# Patient Record
Sex: Female | Born: 1935 | Race: White | Hispanic: No | Smoking: Former smoker
Health system: Southern US, Community
[De-identification: ages and names within clinical notes are randomized; demographics above are authoritative.]

## PROBLEM LIST (undated history)

## (undated) DIAGNOSIS — Z860100 Personal history of colon polyps, unspecified: Secondary | ICD-10-CM

## (undated) DIAGNOSIS — Z9889 Other specified postprocedural states: Secondary | ICD-10-CM

## (undated) DIAGNOSIS — Z9221 Personal history of antineoplastic chemotherapy: Secondary | ICD-10-CM

## (undated) DIAGNOSIS — C50919 Malignant neoplasm of unspecified site of unspecified female breast: Secondary | ICD-10-CM

## (undated) DIAGNOSIS — E119 Type 2 diabetes mellitus without complications: Secondary | ICD-10-CM

## (undated) DIAGNOSIS — K5790 Diverticulosis of intestine, part unspecified, without perforation or abscess without bleeding: Secondary | ICD-10-CM

## (undated) DIAGNOSIS — J45909 Unspecified asthma, uncomplicated: Secondary | ICD-10-CM

## (undated) DIAGNOSIS — Z973 Presence of spectacles and contact lenses: Secondary | ICD-10-CM

## (undated) DIAGNOSIS — T7840XA Allergy, unspecified, initial encounter: Secondary | ICD-10-CM

## (undated) DIAGNOSIS — H9190 Unspecified hearing loss, unspecified ear: Secondary | ICD-10-CM

## (undated) DIAGNOSIS — I10 Essential (primary) hypertension: Secondary | ICD-10-CM

## (undated) DIAGNOSIS — M549 Dorsalgia, unspecified: Secondary | ICD-10-CM

## (undated) DIAGNOSIS — H269 Unspecified cataract: Secondary | ICD-10-CM

## (undated) DIAGNOSIS — R112 Nausea with vomiting, unspecified: Secondary | ICD-10-CM

## (undated) DIAGNOSIS — E039 Hypothyroidism, unspecified: Secondary | ICD-10-CM

## (undated) DIAGNOSIS — K649 Unspecified hemorrhoids: Secondary | ICD-10-CM

## (undated) DIAGNOSIS — E785 Hyperlipidemia, unspecified: Secondary | ICD-10-CM

## (undated) HISTORY — PX: BREAST SURGERY: SHX581

## (undated) HISTORY — PX: CARDIAC CATHETERIZATION: SHX172

## (undated) HISTORY — PX: COLONOSCOPY: SHX174

## (undated) HISTORY — DX: Personal history of antineoplastic chemotherapy: Z92.21

## (undated) HISTORY — PX: NEUROPLASTY / TRANSPOSITION MEDIAN NERVE AT CARPAL TUNNEL BILATERAL: SUR894

## (undated) HISTORY — PX: APPENDECTOMY: SHX54

## (undated) HISTORY — PX: NASAL FRACTURE SURGERY: SHX718

## (undated) HISTORY — PX: OTHER SURGICAL HISTORY: SHX169

## (undated) HISTORY — PX: ABDOMINAL HYSTERECTOMY: SHX81

## (undated) HISTORY — PX: TONSILLECTOMY: SUR1361

---
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
## 2011-12-28 ENCOUNTER — Other Ambulatory Visit: Payer: Self-pay | Admitting: Family Medicine

## 2011-12-28 DIAGNOSIS — N6310 Unspecified lump in the right breast, unspecified quadrant: Secondary | ICD-10-CM

## 2012-01-05 ENCOUNTER — Ambulatory Visit
Admission: RE | Admit: 2012-01-05 | Discharge: 2012-01-05 | Disposition: A | Discharge status: Home / Self Care | Payer: Medicare Other | Source: Ambulatory Visit | Attending: Family Medicine | Admitting: Family Medicine

## 2012-01-05 DIAGNOSIS — N6310 Unspecified lump in the right breast, unspecified quadrant: Secondary | ICD-10-CM

## 2012-01-05 DIAGNOSIS — C50411 Malignant neoplasm of upper-outer quadrant of right female breast: Secondary | ICD-10-CM | POA: Insufficient documentation

## 2012-01-12 ENCOUNTER — Encounter (INDEPENDENT_AMBULATORY_CARE_PROVIDER_SITE_OTHER): Payer: Self-pay | Admitting: Surgery

## 2012-01-12 ENCOUNTER — Ambulatory Visit (INDEPENDENT_AMBULATORY_CARE_PROVIDER_SITE_OTHER): Payer: Medicare Other | Admitting: Surgery

## 2012-01-12 VITALS — BP 183/104 | HR 74 | Temp 97.2°F | Resp 18 | Ht 65.5 in | Wt 189.6 lb

## 2012-01-12 DIAGNOSIS — C50919 Malignant neoplasm of unspecified site of unspecified female breast: Secondary | ICD-10-CM

## 2012-01-12 DIAGNOSIS — C50911 Malignant neoplasm of unspecified site of right female breast: Secondary | ICD-10-CM

## 2012-01-12 NOTE — Patient Instructions (Addendum)
We will arrange surgery to remove the cancer from your right breast. This will be a lumpectomy. We will also check the lymph nodes in the right armpit.if you have any questions give [place] a call. Should like to get a second opinion let me know and we will try to arrange that wherever you would like to be seen.

## 2012-01-12 NOTE — Progress Notes (Signed)
Patient ID: Heather Vega, female   DOB: 02/21/1936, 76 y.o.   MRN: [id number]  Chief Complaint  Patient presents with  . Breast Cancer    Right    HPI Heather Vega is a 76 y.o. female.  She recently had a mammogram. Her last mammogram was about 5 years earlier. This new mammogram showed a suspicious spot in the upper outer quadrant of the right breast. It also looks suspicious on ultrasound. Core biopsy has shown invasive ductal carcinoma. She comes in today for surgical consultation. She's had no symptoms from her breast. She had a benign mass removed from her breast many many years ago. She has no family history of breast cancer. HPI  History reviewed. No pertinent past medical history.  Past Surgical History  Procedure Date  . Nose surgery 1953  . Tonsillectomy 1955  . Abdominal hysterectomy   . Breast surgery     History reviewed. No pertinent family history.  Social History History  Substance Use Topics  . Smoking status: Former Smoker  . Smokeless tobacco: Not on file  . Alcohol Use: No    No Known Allergies  Current Outpatient Prescriptions  Medication Sig Dispense Refill  . aspirin 81 MG tablet Take 81 mg by mouth daily.      . CINNAMON PO Take by mouth.      . metFORMIN (GLUCOPHAGE) 1000 MG tablet Take 1,000 mg by mouth 2 (two) times daily with a meal.      . Multiple Vitamins-Minerals (MULTIVITAMIN WITH MINERALS) tablet Take 1 tablet by mouth daily.      . Vitamins C E (CRANBERRY CONCENTRATE PO) Take by mouth.        Review of Systems Review of Systems  Constitutional: Negative for fever, chills and unexpected weight change.  HENT: Negative for hearing loss, congestion, sore throat, trouble swallowing and voice change.   Eyes: Negative for visual disturbance.  Respiratory: Negative for cough and wheezing.   Cardiovascular: Negative for chest pain, palpitations and leg swelling.  Gastrointestinal: Negative for nausea, vomiting, abdominal pain,  diarrhea, constipation, blood in stool, abdominal distention and anal bleeding.  Genitourinary: Negative for hematuria, vaginal bleeding and difficulty urinating.  Musculoskeletal: Negative for arthralgias.  Skin: Negative for rash and wound.  Neurological: Negative for seizures, syncope and headaches.  Hematological: Negative for adenopathy. Does not bruise/bleed easily.  Psychiatric/Behavioral: Negative for confusion.    Blood pressure 183/104, pulse 74, temperature 97.2 F (36.2 C), temperature source Temporal, resp. rate 18, height 5' 5.5" (1.664 m), weight 189 lb 9.6 oz (86.002 kg).  Physical Exam Physical Exam  Vitals reviewed. Constitutional: She is oriented to person, place, and time. She appears well-developed and well-nourished. No distress.  HENT:  Head: Normocephalic and atraumatic.  Mouth/Throat: Oropharynx is clear and moist.  Eyes: Conjunctivae normal and EOM are normal. Pupils are equal, round, and reactive to light. No scleral icterus.  Neck: Normal range of motion. Neck supple. No tracheal deviation present. No thyromegaly present.  Cardiovascular: Normal rate, regular rhythm, normal heart sounds and intact distal pulses.  Exam reveals no gallop and no friction rub.   No murmur heard. Pulmonary/Chest: Effort normal and breath sounds normal. No respiratory distress. She has no wheezes. She has no rales.         Small ecchymosis in the right breast upper-outer quadrant is documented. No palpable mass. No palpable mass in the other breast or other abnormality in either breast.  Abdominal: Soft. Bowel sounds are normal. She exhibits no   distension and no mass. There is no tenderness. There is no rebound and no guarding.  Musculoskeletal: Normal range of motion. She exhibits no edema and no tenderness.  Lymphadenopathy:    She has no cervical adenopathy.    She has no axillary adenopathy.       Right: No supraclavicular adenopathy present.       Left: No supraclavicular  adenopathy present.  Neurological: She is alert and oriented to person, place, and time.  Skin: Skin is warm and dry. No rash noted. She is not diaphoretic. No erythema.  Psychiatric: She has a normal mood and affect. Her behavior is normal. Judgment and thought content normal.    Data Reviewed I have reviewed the mammogram and ultrasound reports as well as the pathology report  Assessment    Clinical stage I invasive ductal carcinoma right breast upper outer quadrant    Plan    I have explained the pathophysiology and staging of breast cancer with particular attention to her exact situation. We discussed the multidisciplinary approach to breast cancer which often includes both medical and radiation oncology consultations.  We also discussed surgical options for the treatment of breast cancer including lumpectomy and mastectomy with possible reconstructive surgery. In addition we talked about the evaluation and management of lymph nodes including a description of sentinel lymph node biopsy and axillary dissections. We reviewed potential complications and risks including bleeding, infection, numbness,  lymphedema, and the potential need for additional surgery.  She understands that for patients who are candidate for lumpectomy or mastectomy there is an equal survival rate with either technique, but a slightly higher local recurrence rate with lumpectomy. In addition she knows that a lumpectomy usually requires postoperative radiation as part of the management of the breast cancer.  We have discussed the likely postoperative course and plans for followup.  I have given the patient some written information that reviewed all of these issues. I believe her questions are answered and that she has a good understanding of the issues. I spent 30 minutes in direct counseling with the patient, her friend, and her son who took extensive notes. My recommendation was for wire localized lumpectomy and  sentinel node evaluation. Since she is strongly estrogen receptor positive I think she will likely need anti-estrogen therapy postoperatively. She may need radiation therapy. Although she is 76, she is very active and relatively healthy and I thought it would be appropriate to do a sentinel lymph node evaluation at the time of her lumpectomy. We discussed risks and complications involved that surgery. We talked about alternatives including a mastectomy, and the possibility of just using antiestrogen therapy alone with no surgical intervention. I think all questions were answered as noted above. Currently she would like to schedule surgery but this could also consider whether or not second opinion. I told her we would be happy to arrange a second opinion with one of the medical oncologists here in town the surgeon at one of the local Medical Center's.       Arin Peral J 01/12/2012, 4:01 PM    

## 2012-01-31 ENCOUNTER — Encounter (HOSPITAL_COMMUNITY): Payer: Self-pay

## 2012-01-31 ENCOUNTER — Encounter (HOSPITAL_COMMUNITY)
Admission: RE | Admit: 2012-01-31 | Discharge: 2012-01-31 | Disposition: A | Discharge status: Home / Self Care | Payer: Medicare Other | Source: Ambulatory Visit | Attending: Surgery | Admitting: Surgery

## 2012-01-31 HISTORY — DX: Unspecified asthma, uncomplicated: J45.909

## 2012-01-31 HISTORY — DX: Personal history of colon polyps, unspecified: Z86.0100

## 2012-01-31 HISTORY — DX: Personal history of colonic polyps: Z86.010

## 2012-01-31 HISTORY — DX: Type 2 diabetes mellitus without complications: E11.9

## 2012-01-31 HISTORY — DX: Diverticulosis of intestine, part unspecified, without perforation or abscess without bleeding: K57.90

## 2012-01-31 HISTORY — DX: Unspecified hearing loss, unspecified ear: H91.90

## 2012-01-31 HISTORY — DX: Hyperlipidemia, unspecified: E78.5

## 2012-01-31 HISTORY — DX: Hypothyroidism, unspecified: E03.9

## 2012-01-31 HISTORY — DX: Dorsalgia, unspecified: M54.9

## 2012-01-31 HISTORY — DX: Unspecified hemorrhoids: K64.9

## 2012-01-31 LAB — COMPREHENSIVE METABOLIC PANEL
ALT: 23 U/L (ref 0–35)
AST: 32 U/L (ref 0–37)
Albumin: 3.9 g/dL (ref 3.5–5.2)
CO2: 28 mEq/L (ref 19–32)
Calcium: 9.8 mg/dL (ref 8.4–10.5)
Chloride: 98 mEq/L (ref 96–112)
GFR calc non Af Amer: 80 mL/min — ABNORMAL LOW (ref >90)
Sodium: 138 mEq/L (ref 135–145)

## 2012-01-31 LAB — CBC WITH DIFFERENTIAL/PLATELET
Basophils Absolute: 0 10*3/uL (ref 0.0–0.1)
Basophils Relative: 0 % (ref 0–1)
Eosinophils Relative: 3 % (ref 0–5)
Lymphocytes Relative: 30 % (ref 12–46)
MCHC: 34.5 g/dL (ref 30.0–36.0)
Neutro Abs: 4.6 10*3/uL (ref 1.7–7.7)
Platelets: 267 10*3/uL (ref 150–400)
RDW: 13 % (ref 11.5–15.5)
WBC: 7.4 10*3/uL (ref 4.0–10.5)

## 2012-01-31 LAB — SURGICAL PCR SCREEN: Staphylococcus aureus: NEGATIVE

## 2012-01-31 MED ORDER — CHLORHEXIDINE GLUCONATE 4 % EX LIQD: 1.0000 "application " | Freq: Once | CUTANEOUS | Status: DC

## 2012-01-31 NOTE — Pre-Procedure Instructions (Signed)
22 Heather Vega  01/31/2012   Your procedure is scheduled on:  Tues, Nov 12 @ 10:30 AM  Report to [facility] at 8:30 AM.  Call this number if you have problems the morning of surgery: [phone]   Remember:   Do not eat food:After Midnight.     Do not wear jewelry, make-up or nail polish.  Do not wear lotions, powders, or perfumes. You may wear deodorant.  Do not shave 48 hours prior to surgery.   Do not bring valuables to the hospital.  Contacts, dentures or bridgework may not be worn into surgery.  Leave suitcase in the car. After surgery it may be brought to your room.  For patients admitted to the hospital, checkout time is 11:00 AM the day of discharge.   Patients discharged the day of surgery will not be allowed to drive home.    Special Instructions: Shower using CHG 2 nights before surgery and the night before surgery.  If you shower the day of surgery use CHG.  Use special wash - you have one bottle of CHG for all showers.  You should use approximately 1/3 of the bottle for each shower.   Please read over the following fact sheets that you were given: Pain Booklet, Coughing and Deep Breathing, MRSA Information and Surgical Site Infection Prevention

## 2012-01-31 NOTE — Progress Notes (Signed)
Confirmed with pt that she does have to go to the breast center prior to short stay arrival

## 2012-01-31 NOTE — Progress Notes (Addendum)
Pt doesn't have a cardiologist   Stress test done 15+yrs ago  Heart cath 20+yrs ago  Medical Md is Dr.Whitt in [place]-to request echo/stress test/ekg/and any other cardiac notes  Requested cxr from Dr.Whitt

## 2012-02-01 NOTE — Consult Note (Signed)
Anesthesia chart review: Patient is a 76 year old female scheduled for right wire localized lumpectomy and sentinel node biopsy by Dr. Jamey Ripa on 02/07/2012. Other history includes former smoker, hyperlipidemia, asthma, diverticulosis, hypothyroidism, diabetes mellitus type 2, hard of hearing, obesity.  PCP is Dr. Rolm Baptise in [place].  EKG on 04/18/11 showed NSR, possible anterior infarct (age undetermined).  On 04/25/11 [facility]) she had a negative exercise treadmill stress test, normal myocardial perfusion study with no defect suggestive of ischemia or infarct, moderate gut attenuation.  EF > 65%.   She is for a CXR on the day of surgery.  Labs noted.  She is for a UA on arrival.  Anticipate she can proceed as planned.  Shonna Chock, PA-C

## 2012-02-06 MED ORDER — CEFAZOLIN SODIUM-DEXTROSE 2-3 GM-% IV SOLR
2.0000 g | INTRAVENOUS | Status: AC
  Administered 2012-02-07: 2 g via INTRAVENOUS
  Filled 2012-02-06: qty 50

## 2012-02-07 ENCOUNTER — Encounter (HOSPITAL_COMMUNITY)
Admission: RE | Admit: 2012-02-07 | Discharge: 2012-02-07 | Disposition: A | Discharge status: Home / Self Care | Payer: Medicare Other | Source: Ambulatory Visit | Attending: Surgery | Admitting: Surgery

## 2012-02-07 ENCOUNTER — Encounter (HOSPITAL_COMMUNITY)
Admission: RE | Disposition: A | Discharge status: Home / Self Care | Payer: Self-pay | Source: Ambulatory Visit | Attending: Surgery

## 2012-02-07 ENCOUNTER — Ambulatory Visit (HOSPITAL_COMMUNITY)
Admission: RE | Admit: 2012-02-07 | Discharge: 2012-02-07 | Disposition: A | Discharge status: Home / Self Care | Payer: Medicare Other | Source: Ambulatory Visit | Attending: Surgery | Admitting: Surgery

## 2012-02-07 ENCOUNTER — Encounter (HOSPITAL_COMMUNITY): Payer: Self-pay | Admitting: Vascular Surgery

## 2012-02-07 ENCOUNTER — Ambulatory Visit
Admission: RE | Admit: 2012-02-07 | Discharge: 2012-02-07 | Disposition: A | Discharge status: Home / Self Care | Payer: Medicare Other | Source: Ambulatory Visit | Attending: Surgery | Admitting: Surgery

## 2012-02-07 ENCOUNTER — Encounter (HOSPITAL_COMMUNITY): Payer: Self-pay | Admitting: *Deleted

## 2012-02-07 ENCOUNTER — Ambulatory Visit (HOSPITAL_COMMUNITY): Payer: Medicare Other

## 2012-02-07 ENCOUNTER — Other Ambulatory Visit (INDEPENDENT_AMBULATORY_CARE_PROVIDER_SITE_OTHER): Payer: Self-pay | Admitting: Surgery

## 2012-02-07 ENCOUNTER — Ambulatory Visit (HOSPITAL_COMMUNITY): Payer: Medicare Other | Admitting: Vascular Surgery

## 2012-02-07 DIAGNOSIS — C50419 Malignant neoplasm of upper-outer quadrant of unspecified female breast: Secondary | ICD-10-CM | POA: Insufficient documentation

## 2012-02-07 DIAGNOSIS — Z01818 Encounter for other preprocedural examination: Secondary | ICD-10-CM | POA: Insufficient documentation

## 2012-02-07 DIAGNOSIS — Z79899 Other long term (current) drug therapy: Secondary | ICD-10-CM | POA: Insufficient documentation

## 2012-02-07 DIAGNOSIS — C50911 Malignant neoplasm of unspecified site of right female breast: Secondary | ICD-10-CM

## 2012-02-07 DIAGNOSIS — D0590 Unspecified type of carcinoma in situ of unspecified breast: Secondary | ICD-10-CM

## 2012-02-07 DIAGNOSIS — C50919 Malignant neoplasm of unspecified site of unspecified female breast: Secondary | ICD-10-CM | POA: Insufficient documentation

## 2012-02-07 DIAGNOSIS — Z01812 Encounter for preprocedural laboratory examination: Secondary | ICD-10-CM | POA: Insufficient documentation

## 2012-02-07 DIAGNOSIS — Z7982 Long term (current) use of aspirin: Secondary | ICD-10-CM | POA: Insufficient documentation

## 2012-02-07 HISTORY — PX: BREAST LUMPECTOMY WITH NEEDLE LOCALIZATION AND AXILLARY SENTINEL LYMPH NODE BX: SHX5760

## 2012-02-07 LAB — GLUCOSE, CAPILLARY
Glucose-Capillary: 131 mg/dL — ABNORMAL HIGH (ref 70–99)
Glucose-Capillary: 178 mg/dL — ABNORMAL HIGH (ref 70–99)

## 2012-02-07 LAB — URINALYSIS, ROUTINE W REFLEX MICROSCOPIC
Ketones, ur: NEGATIVE mg/dL
Nitrite: NEGATIVE
Protein, ur: NEGATIVE mg/dL

## 2012-02-07 LAB — URINE MICROSCOPIC-ADD ON

## 2012-02-07 SURGERY — BREAST LUMPECTOMY WITH NEEDLE LOCALIZATION AND AXILLARY SENTINEL LYMPH NODE BX
Anesthesia: General | Site: Breast | Laterality: Right

## 2012-02-07 MED ORDER — METHYLENE BLUE 1 % INJ SOLN
INTRAMUSCULAR | Status: AC
  Filled 2012-02-07: qty 10

## 2012-02-07 MED ORDER — OXYCODONE HCL 5 MG/5ML PO SOLN: 5.0000 mg | Freq: Once | ORAL | Status: DC | PRN

## 2012-02-07 MED ORDER — METHYLENE BLUE 1 % INJ SOLN
INTRAMUSCULAR | Status: DC | PRN
  Administered 2012-02-07: 2 mL

## 2012-02-07 MED ORDER — HYDROMORPHONE HCL PF 1 MG/ML IJ SOLN
INTRAMUSCULAR | Status: AC
  Administered 2012-02-07: 0.25 mg via INTRAVENOUS
  Filled 2012-02-07: qty 1

## 2012-02-07 MED ORDER — BUPIVACAINE HCL (PF) 0.25 % IJ SOLN
INTRAMUSCULAR | Status: AC
  Filled 2012-02-07: qty 30

## 2012-02-07 MED ORDER — LACTATED RINGERS IV SOLN
INTRAVENOUS | Status: DC
  Administered 2012-02-07: 11:00:00 via INTRAVENOUS

## 2012-02-07 MED ORDER — ONDANSETRON HCL 4 MG/2ML IJ SOLN
INTRAMUSCULAR | Status: DC | PRN
  Administered 2012-02-07: 4 mg via INTRAVENOUS

## 2012-02-07 MED ORDER — EPHEDRINE SULFATE 50 MG/ML IJ SOLN
INTRAMUSCULAR | Status: DC | PRN
  Administered 2012-02-07: 10 mg via INTRAVENOUS

## 2012-02-07 MED ORDER — MIDAZOLAM HCL 5 MG/5ML IJ SOLN
INTRAMUSCULAR | Status: DC | PRN
  Administered 2012-02-07: 1 mg via INTRAVENOUS

## 2012-02-07 MED ORDER — TECHNETIUM TC 99M SULFUR COLLOID FILTERED
1.0000 | Freq: Once | INTRAVENOUS | Status: AC | PRN
  Administered 2012-02-07: 1 via INTRADERMAL

## 2012-02-07 MED ORDER — HYDROMORPHONE HCL PF 1 MG/ML IJ SOLN
0.2500 mg | INTRAMUSCULAR | Status: DC | PRN
  Administered 2012-02-07: 0.5 mg via INTRAVENOUS
  Administered 2012-02-07 (×2): 0.25 mg via INTRAVENOUS

## 2012-02-07 MED ORDER — FENTANYL CITRATE 0.05 MG/ML IJ SOLN
INTRAMUSCULAR | Status: DC | PRN
  Administered 2012-02-07: 25 ug via INTRAVENOUS
  Administered 2012-02-07: 100 ug via INTRAVENOUS

## 2012-02-07 MED ORDER — PROMETHAZINE HCL 25 MG/ML IJ SOLN: 6.2500 mg | INTRAMUSCULAR | Status: DC | PRN

## 2012-02-07 MED ORDER — LACTATED RINGERS IV SOLN
INTRAVENOUS | Status: DC | PRN
  Administered 2012-02-07 (×2): via INTRAVENOUS

## 2012-02-07 MED ORDER — ONDANSETRON HCL 4 MG/2ML IJ SOLN: 4.0000 mg | Freq: Once | INTRAMUSCULAR | Status: DC

## 2012-02-07 MED ORDER — BUPIVACAINE HCL (PF) 0.25 % IJ SOLN
INTRAMUSCULAR | Status: DC | PRN
  Administered 2012-02-07: 30 mL

## 2012-02-07 MED ORDER — OXYCODONE HCL 5 MG PO TABS: 5.0000 mg | ORAL_TABLET | Freq: Once | ORAL | Status: DC | PRN

## 2012-02-07 MED ORDER — FENTANYL CITRATE 0.05 MG/ML IJ SOLN
INTRAMUSCULAR | Status: AC
  Filled 2012-02-07: qty 2

## 2012-02-07 MED ORDER — HYDROCODONE-ACETAMINOPHEN 5-325 MG PO TABS: 1.0000 | ORAL_TABLET | ORAL | Status: DC | PRN

## 2012-02-07 MED ORDER — ONDANSETRON HCL 4 MG/2ML IJ SOLN
INTRAMUSCULAR | Status: AC
  Administered 2012-02-07: 4 mg
  Filled 2012-02-07: qty 2

## 2012-02-07 MED ORDER — MIDAZOLAM HCL 2 MG/2ML IJ SOLN: 1.0000 mg | INTRAMUSCULAR | Status: DC | PRN

## 2012-02-07 MED ORDER — 0.9 % SODIUM CHLORIDE (POUR BTL) OPTIME
TOPICAL | Status: DC | PRN
  Administered 2012-02-07: 1000 mL

## 2012-02-07 MED ORDER — FENTANYL CITRATE 0.05 MG/ML IJ SOLN
50.0000 ug | Freq: Once | INTRAMUSCULAR | Status: AC
  Administered 2012-02-07: 50 ug via INTRAVENOUS

## 2012-02-07 MED ORDER — PROPOFOL 10 MG/ML IV BOLUS
INTRAVENOUS | Status: DC | PRN
  Administered 2012-02-07: 120 mg via INTRAVENOUS

## 2012-02-07 MED ORDER — SODIUM CHLORIDE 0.9 % IJ SOLN
INTRAMUSCULAR | Status: DC | PRN
  Administered 2012-02-07: 3 mL

## 2012-02-07 SURGICAL SUPPLY — 43 items
APPLIER CLIP 9.375 MED OPEN (MISCELLANEOUS) ×2
BINDER BREAST XLRG (GAUZE/BANDAGES/DRESSINGS) ×4 IMPLANT
BLADE SURG 10 STRL SS (BLADE) ×2 IMPLANT
BLADE SURG 15 STRL LF DISP TIS (BLADE) ×1 IMPLANT
BLADE SURG 15 STRL SS (BLADE) ×1
CHLORAPREP W/TINT 26ML (MISCELLANEOUS) ×2 IMPLANT
CLIP APPLIE 9.375 MED OPEN (MISCELLANEOUS) ×1 IMPLANT
CLOTH BEACON ORANGE TIMEOUT ST (SAFETY) ×2 IMPLANT
CONT SPEC 4OZ CLIKSEAL STRL BL (MISCELLANEOUS) ×2 IMPLANT
COVER PROBE W GEL 5X96 (DRAPES) ×2 IMPLANT
COVER SURGICAL LIGHT HANDLE (MISCELLANEOUS) ×2 IMPLANT
DECANTER SPIKE VIAL GLASS SM (MISCELLANEOUS) ×2 IMPLANT
DERMABOND ADVANCED (GAUZE/BANDAGES/DRESSINGS) ×1
DERMABOND ADVANCED .7 DNX12 (GAUZE/BANDAGES/DRESSINGS) ×1 IMPLANT
DEVICE DUBIN SPECIMEN MAMMOGRA (MISCELLANEOUS) ×2 IMPLANT
DRAPE CHEST BREAST 15X10 FENES (DRAPES) ×2 IMPLANT
DRAPE UTILITY 15X26 W/TAPE STR (DRAPE) ×4 IMPLANT
ELECT CAUTERY BLADE 6.4 (BLADE) ×2 IMPLANT
ELECT REM PT RETURN 9FT ADLT (ELECTROSURGICAL) ×2
ELECTRODE REM PT RTRN 9FT ADLT (ELECTROSURGICAL) ×1 IMPLANT
GLOVE EUDERMIC 7 POWDERFREE (GLOVE) ×2 IMPLANT
GOWN PREVENTION PLUS XLARGE (GOWN DISPOSABLE) ×2 IMPLANT
GOWN STRL NON-REIN LRG LVL3 (GOWN DISPOSABLE) ×2 IMPLANT
KIT BASIN OR (CUSTOM PROCEDURE TRAY) ×2 IMPLANT
KIT MARKER MARGIN INK (KITS) ×2 IMPLANT
KIT ROOM TURNOVER OR (KITS) ×2 IMPLANT
NEEDLE 18GX1X1/2 (RX/OR ONLY) (NEEDLE) ×2 IMPLANT
NEEDLE HYPO 25GX1X1/2 BEV (NEEDLE) ×4 IMPLANT
NS IRRIG 1000ML POUR BTL (IV SOLUTION) ×2 IMPLANT
PACK SURGICAL SETUP 50X90 (CUSTOM PROCEDURE TRAY) ×2 IMPLANT
PAD ARMBOARD 7.5X6 YLW CONV (MISCELLANEOUS) ×2 IMPLANT
PENCIL BUTTON HOLSTER BLD 10FT (ELECTRODE) ×2 IMPLANT
SPONGE LAP 4X18 X RAY DECT (DISPOSABLE) ×2 IMPLANT
STAPLER VISISTAT 35W (STAPLE) ×2 IMPLANT
SUT MON AB 4-0 PC3 18 (SUTURE) ×2 IMPLANT
SUT SILK 3 0 (SUTURE)
SUT SILK 3-0 18XBRD TIE 12 (SUTURE) IMPLANT
SUT VIC AB 3-0 SH 18 (SUTURE) ×2 IMPLANT
SYR CONTROL 10ML LL (SYRINGE) ×4 IMPLANT
TOWEL OR 17X24 6PK STRL BLUE (TOWEL DISPOSABLE) ×2 IMPLANT
TOWEL OR 17X26 10 PK STRL BLUE (TOWEL DISPOSABLE) ×2 IMPLANT
TUBE CONNECTING 12X1/4 (SUCTIONS) ×2 IMPLANT
YANKAUER SUCT BULB TIP NO VENT (SUCTIONS) ×2 IMPLANT

## 2012-02-07 NOTE — Anesthesia Preprocedure Evaluation (Signed)
Anesthesia Evaluation  Patient identified by MRN, date of birth, ID band Patient awake    Reviewed: Allergy & Precautions, H&P , NPO status , Patient's Chart, lab work & pertinent test results  Airway Mallampati: II TM Distance: >3 FB Neck ROM: Full    Dental   Pulmonary asthma ,  breath sounds clear to auscultation        Cardiovascular Rate:Normal     Neuro/Psych    GI/Hepatic   Endo/Other  diabetes  Renal/GU      Musculoskeletal   Abdominal   Peds  Hematology   Anesthesia Other Findings   Reproductive/Obstetrics                           Anesthesia Physical Anesthesia Plan  ASA: III  Anesthesia Plan: General   Post-op Pain Management:    Induction: Intravenous  Airway Management Planned: LMA  Additional Equipment:   Intra-op Plan:   Post-operative Plan: Extubation in OR  Informed Consent: I have reviewed the patients History and Physical, chart, labs and discussed the procedure including the risks, benefits and alternatives for the proposed anesthesia with the patient or authorized representative who has indicated his/her understanding and acceptance.     Plan Discussed with: CRNA and Surgeon  Anesthesia Plan Comments:         Anesthesia Quick Evaluation

## 2012-02-07 NOTE — Preoperative (Signed)
Beta Blockers   Reason not to administer Beta Blockers:Not Applicable 

## 2012-02-07 NOTE — Progress Notes (Signed)
Pt has been to the Breast Center and nuclear med was advised(Cheryl) of pt's arrival .

## 2012-02-07 NOTE — Anesthesia Postprocedure Evaluation (Signed)
  Anesthesia Post-op Note  Patient: Heather Vega  Procedure(s) Performed: Procedure(s) (LRB) with comments: BREAST LUMPECTOMY WITH NEEDLE LOCALIZATION AND AXILLARY SENTINEL LYMPH NODE BX (Right) - Right needle loc lumpectomy and Senitel Lymph Node Biopsy   Patient Location: PACU  Anesthesia Type:General  Level of Consciousness: awake  Airway and Oxygen Therapy: Patient Spontanous Breathing  Post-op Pain: mild  Post-op Assessment: Post-op Vital signs reviewed  Post-op Vital Signs: stable  Complications: No apparent anesthesia complications

## 2012-02-07 NOTE — Transfer of Care (Signed)
Immediate Anesthesia Transfer of Care Note  Patient: Heather Vega  Procedure(s) Performed: Procedure(s) (LRB) with comments: BREAST LUMPECTOMY WITH NEEDLE LOCALIZATION AND AXILLARY SENTINEL LYMPH NODE BX (Right) - Right needle loc lumpectomy and Senitel Lymph Node Biopsy   Patient Location: PACU  Anesthesia Type:General  Level of Consciousness: awake, alert , oriented and sedated  Airway & Oxygen Therapy: Patient Spontanous Breathing and Patient connected to nasal cannula oxygen  Post-op Assessment: Report given to PACU RN, Post -op Vital signs reviewed and stable and Patient moving all extremities  Post vital signs: Reviewed and stable  Complications: No apparent anesthesia complications

## 2012-02-07 NOTE — Interval H&P Note (Signed)
History and Physical Interval Note:  02/07/2012 10:43 AM  Heather Vega  has presented today for surgery, with the diagnosis of Right breast cancer  The various methods of treatment have been discussed with the patient and family. After consideration of risks, benefits and other options for treatment, the patient has consented to  Procedure(s) (LRB) with comments: BREAST LUMPECTOMY WITH NEEDLE LOCALIZATION AND AXILLARY SENTINEL LYMPH NODE BX (Right) - Right needle loc lumpectomy and Senitel Lymph Node Biopsy  as a surgical intervention .  The patient's history has been reviewed, patient examined, no change in status, stable for surgery.  I have reviewed the patient's chart and labs.  Questions were answered to the patient's satisfaction.   I have reviewed the guide wire localizing films and marked the right breast as the operative site  [name] J

## 2012-02-07 NOTE — H&P (View-Only) (Signed)
Patient ID: Heather Vega, female   DOB: [birth year], 76 y.o.   MRN: [id number]  Chief Complaint  Patient presents with  . Breast Cancer    Right    HPI Heather Vega is a 76 y.o. female.  She recently had a mammogram. Her last mammogram was about 5 years earlier. This new mammogram showed a suspicious spot in the upper outer quadrant of the right breast. It also looks suspicious on ultrasound. Core biopsy has shown invasive ductal carcinoma. She comes in today for surgical consultation. She's had no symptoms from her breast. She had a benign mass removed from her breast many many years ago. She has no family history of breast cancer. HPI  History reviewed. No pertinent past medical history.  Past Surgical History  Procedure Date  . Nose surgery 1953  . Tonsillectomy 1955  . Abdominal hysterectomy   . Breast surgery     History reviewed. No pertinent family history.  Social History History  Substance Use Topics  . Smoking status: Former Games developer  . Smokeless tobacco: Not on file  . Alcohol Use: No    No Known Allergies  Current Outpatient Prescriptions  Medication Sig Dispense Refill  . aspirin 81 MG tablet Take 81 mg by mouth daily.      Marland Kitchen CINNAMON PO Take by mouth.      . metFORMIN (GLUCOPHAGE) 1000 MG tablet Take 1,000 mg by mouth 2 (two) times daily with a meal.      . Multiple Vitamins-Minerals (MULTIVITAMIN WITH MINERALS) tablet Take 1 tablet by mouth daily.      . Vitamins C E (CRANBERRY CONCENTRATE PO) Take by mouth.        Review of Systems Review of Systems  Constitutional: Negative for fever, chills and unexpected weight change.  HENT: Negative for hearing loss, congestion, sore throat, trouble swallowing and voice change.   Eyes: Negative for visual disturbance.  Respiratory: Negative for cough and wheezing.   Cardiovascular: Negative for chest pain, palpitations and leg swelling.  Gastrointestinal: Negative for nausea, vomiting, abdominal pain,  diarrhea, constipation, blood in stool, abdominal distention and anal bleeding.  Genitourinary: Negative for hematuria, vaginal bleeding and difficulty urinating.  Musculoskeletal: Negative for arthralgias.  Skin: Negative for rash and wound.  Neurological: Negative for seizures, syncope and headaches.  Hematological: Negative for adenopathy. Does not bruise/bleed easily.  Psychiatric/Behavioral: Negative for confusion.    Blood pressure 183/104, pulse 74, temperature 97.2 F (36.2 C), temperature source Temporal, resp. rate 18, height 5' 5.5" (1.664 m), weight 189 lb 9.6 oz (86.002 kg).  Physical Exam Physical Exam  Vitals reviewed. Constitutional: She is oriented to person, place, and time. She appears well-developed and well-nourished. No distress.  HENT:  Head: Normocephalic and atraumatic.  Mouth/Throat: Oropharynx is clear and moist.  Eyes: Conjunctivae normal and EOM are normal. Pupils are equal, round, and reactive to light. No scleral icterus.  Neck: Normal range of motion. Neck supple. No tracheal deviation present. No thyromegaly present.  Cardiovascular: Normal rate, regular rhythm, normal heart sounds and intact distal pulses.  Exam reveals no gallop and no friction rub.   No murmur heard. Pulmonary/Chest: Effort normal and breath sounds normal. No respiratory distress. She has no wheezes. She has no rales.         Small ecchymosis in the right breast upper-outer quadrant is documented. No palpable mass. No palpable mass in the other breast or other abnormality in either breast.  Abdominal: Soft. Bowel sounds are normal. She exhibits no  distension and no mass. There is no tenderness. There is no rebound and no guarding.  Musculoskeletal: Normal range of motion. She exhibits no edema and no tenderness.  Lymphadenopathy:    She has no cervical adenopathy.    She has no axillary adenopathy.       Right: No supraclavicular adenopathy present.       Left: No supraclavicular  adenopathy present.  Neurological: She is alert and oriented to person, place, and time.  Skin: Skin is warm and dry. No rash noted. She is not diaphoretic. No erythema.  Psychiatric: She has a normal mood and affect. Her behavior is normal. Judgment and thought content normal.    Data Reviewed I have reviewed the mammogram and ultrasound reports as well as the pathology report  Assessment    Clinical stage I invasive ductal carcinoma right breast upper outer quadrant    Plan    I have explained the pathophysiology and staging of breast cancer with particular attention to her exact situation. We discussed the multidisciplinary approach to breast cancer which often includes both medical and radiation oncology consultations.  We also discussed surgical options for the treatment of breast cancer including lumpectomy and mastectomy with possible reconstructive surgery. In addition we talked about the evaluation and management of lymph nodes including a description of sentinel lymph node biopsy and axillary dissections. We reviewed potential complications and risks including bleeding, infection, numbness,  lymphedema, and the potential need for additional surgery.  She understands that for patients who are candidate for lumpectomy or mastectomy there is an equal survival rate with either technique, but a slightly higher local recurrence rate with lumpectomy. In addition she knows that a lumpectomy usually requires postoperative radiation as part of the management of the breast cancer.  We have discussed the likely postoperative course and plans for followup.  I have given the patient some written information that reviewed all of these issues. I believe her questions are answered and that she has a good understanding of the issues. I spent 30 minutes in direct counseling with the patient, her friend, and her son who took extensive notes. My recommendation was for wire localized lumpectomy and  sentinel node evaluation. Since she is strongly estrogen receptor positive I think she will likely need anti-estrogen therapy postoperatively. She may need radiation therapy. Although she is 77, she is very active and relatively healthy and I thought it would be appropriate to do a sentinel lymph node evaluation at the time of her lumpectomy. We discussed risks and complications involved that surgery. We talked about alternatives including a mastectomy, and the possibility of just using antiestrogen therapy alone with no surgical intervention. I think all questions were answered as noted above. Currently she would like to schedule surgery but this could also consider whether or not second opinion. I told her we would be happy to arrange a second opinion with one of the medical oncologists here in town the surgeon at one of the local Medical Center's.       Jeriah Corkum J 01/12/2012, 4:01 PM

## 2012-02-07 NOTE — Op Note (Signed)
Heather Vega  [birth year]  [id number]  02/07/2012   Preoperative diagnosis: right breast cancer, upper outer quadrant, clinical stage I  Postoperative diagnosis: same  Procedure: wire localized right lumpectomy with blue dye injection and right axillary sentinel lymph node excision  Surgeon: Currie Paris, MD, FACS  Anesthesia: General  Clinical History and Indications: this patient presents for a guidewire localized excision of a right breast cancer.  Description of procedure: The patient was seen in the holding area and the plans for the procedure reviewed. The right breast was marked as the operative side. The wire localizing films were reviewed.  The patient was taken to the operating room and after satisfactory general anesthesia had been obtained the right breast was prepped and draped and the timeout was performed.5 cc of dilute methylene blue was injected in the right subareolar area and massaged in.  The incision was made over the presumed area of the mass. There was a "X." directly over the lesion placed by the radiologist we put the guidewire in. The guidewire entered medial to that and tracked laterally.B. Skin incision was centered on that mark.Skin flaps were raised and using cautery the area was completely excised. I went down to the chest wall thought well around the abnormality in all directions. The specimen mammogram showed the clip placed at the core biopsy to be in the specimen. Placed to mark the margins.Bleeders were controlled with either cautery or sutures as needed.  After achieving hemostasis, the incision was closed with 3-0 Vicryl, 4-0 Monocryl subcuticular, and Dermabond.  Attention was turned to the axillary area. The neoprobe was used to identify a hot area, and a transverse incision was made. It was deepened to the level of the axillary fat pad. Almost immediately I was able to locate a hot blue lymph node.a count of about 4300. There were no other  hot areas, no other blue areas, and no palpably abnormal lymph nodes found. This incision was closed with 3-0 Vicryl, 4 amount of subcutaneous fat, and Dermabond.  Prior to closing each incision through 25% plain Marcaine was injected, 22 cc in the lumpectomy site and 10 cc in the axillary site.  The patient tolerated the procedure well. There were no operative complications. All counts were correct.   EBL: minimal  Currie Paris, MD, FACS 02/07/2012 12:10 PM

## 2012-02-08 ENCOUNTER — Encounter (HOSPITAL_COMMUNITY): Payer: Self-pay | Admitting: Surgery

## 2012-02-08 LAB — URINE CULTURE

## 2012-02-09 ENCOUNTER — Telehealth (INDEPENDENT_AMBULATORY_CARE_PROVIDER_SITE_OTHER): Payer: Self-pay | Admitting: Surgery

## 2012-02-09 DIAGNOSIS — C50911 Malignant neoplasm of unspecified site of right female breast: Secondary | ICD-10-CM

## 2012-02-09 NOTE — Telephone Encounter (Signed)
I reviewed her pathology report with her and her daughter over the telephone. I told her we would make medical and radiation consultations for her.

## 2012-02-10 ENCOUNTER — Telehealth (INDEPENDENT_AMBULATORY_CARE_PROVIDER_SITE_OTHER): Payer: Self-pay | Admitting: General Surgery

## 2012-02-10 DIAGNOSIS — C50919 Malignant neoplasm of unspecified site of unspecified female breast: Secondary | ICD-10-CM

## 2012-02-10 NOTE — Telephone Encounter (Signed)
Referral made 

## 2012-02-10 NOTE — Telephone Encounter (Signed)
Message copied by Liliana Cline on Fri Feb 10, 2012  9:47 AM ------      Message from: Currie Paris      Created: Thu Feb 09, 2012  5:37 PM       She will need both medical and radiation oncology appointments. She has a stage II (T1 C, N1) breast cancer. I did her lumpectomy and sentinel node this week.I will have Lesly Rubenstein put in consults into Epic tomorrow

## 2012-02-13 ENCOUNTER — Telehealth (INDEPENDENT_AMBULATORY_CARE_PROVIDER_SITE_OTHER): Payer: Self-pay | Admitting: General Surgery

## 2012-02-13 NOTE — Telephone Encounter (Signed)
Pt's daughter, Gresia Pupo, calling from her home in [place], [place], asking if the rad onc referral had been scheduled yet.  She wants to be available for some of her mother's appts.  Informed daughter that it takes 2-3 days to set up those appts and they were asked to coordinate it with pt's postop appt with Dr. Jamey Ripa.  She understands.

## 2012-02-13 NOTE — Telephone Encounter (Signed)
Referral to [facility] just sent on Friday 02/10/12. Message sent to coordinators at [facility] to see if this is possible for patient to be seen on 02/17/12. They will call patient with appt.

## 2012-02-13 NOTE — Telephone Encounter (Signed)
Message copied by Liliana Cline on Mon Feb 13, 2012  8:15 AM ------      Message from: Marnette Burgess      Created: Fri Feb 10, 2012  1:36 PM      Contact: Juline Patch, [phone]       Patient's daughter called, oncology appt was suppose to be made for the same day as her appt w/Dr. CS on 02/17/12, reason being both she and her brother live out of town and would like this taken care of on the same day.  She was waiting for a call and would like for someone to call her regarding this today, thank you.

## 2012-02-16 ENCOUNTER — Telehealth (INDEPENDENT_AMBULATORY_CARE_PROVIDER_SITE_OTHER): Payer: Self-pay | Admitting: Surgery

## 2012-02-16 ENCOUNTER — Telehealth (INDEPENDENT_AMBULATORY_CARE_PROVIDER_SITE_OTHER): Payer: Self-pay | Admitting: General Surgery

## 2012-02-16 ENCOUNTER — Telehealth: Payer: Self-pay | Admitting: *Deleted

## 2012-02-16 NOTE — Telephone Encounter (Signed)
Patient returned my call and I confirmed 02/21/12 appt w/ pt.  Mailed before appt letter & packet to pt.  Emailed Jade at [facility] to make aware.  Took paperwork to Med Rec for chart.

## 2012-02-16 NOTE — Telephone Encounter (Signed)
Spoke with pt and informed her that I was unable to get both of her appts on the same day and that as of right now she will have her appt with Streck on 11/22 at her appt w/ Dr. Welton Flakes on 11/26.  She said if this is all we could do then that was fine, and that she understood that it would be hard to accomplish.  I informed her that if I heard from our breast cancer coordinators that we could squeeze her in with Dr. Welton Flakes tomorrow then I would call her.

## 2012-02-16 NOTE — Telephone Encounter (Signed)
LMOm asking them to call me back so we can get this patient an appt w/ oncology.  Explained that referral was put in on 02/10/12 by [facility].  This pt specifically asked to have her onc appt and appt w/ Dr. Jamey Ripa on the same day due to her children coming in from [place] to take her.

## 2012-02-16 NOTE — Telephone Encounter (Signed)
Pt has an appointment with Dr. Jamey Ripa tomorrow and daughter is calling to find out if and when she has an oncology appointment/ [facility] and confirmed that pt has an appointment with Dr. Welton Flakes on 02-21-12 at 11am/ I left a message on Linda's answering machine to that effect/gy

## 2012-02-16 NOTE — Telephone Encounter (Signed)
She had question about her oncology appointments when she called, but now has that worked out. She has post op visit with me tomorrow

## 2012-02-16 NOTE — Telephone Encounter (Signed)
Message copied by Littie Deeds on Thu Feb 16, 2012 10:41 AM ------      Message from: Larry Sierras      Created: Wed Feb 15, 2012 12:47 PM      Regarding: appt      Contact: [phone]       PT HAS QUESTIONS RE: APPT WITH ONCOLOGIST SAME DAY ? AS APPT WITH CS 02-17-12/GM

## 2012-02-17 ENCOUNTER — Ambulatory Visit (INDEPENDENT_AMBULATORY_CARE_PROVIDER_SITE_OTHER): Payer: Medicare Other | Admitting: Surgery

## 2012-02-17 ENCOUNTER — Encounter (INDEPENDENT_AMBULATORY_CARE_PROVIDER_SITE_OTHER): Payer: Self-pay | Admitting: Surgery

## 2012-02-17 VITALS — BP 164/102 | HR 88 | Temp 97.7°F | Resp 18 | Ht 65.5 in | Wt 189.8 lb

## 2012-02-17 DIAGNOSIS — Z09 Encounter for follow-up examination after completed treatment for conditions other than malignant neoplasm: Secondary | ICD-10-CM

## 2012-02-17 NOTE — Progress Notes (Signed)
LEATHIE WEICH    [id number] 02/17/2012    April 06, 1935   CC: Post op lumpectomy  HPI: The patient returns for post op follow-up. She underwent a right lumpectomy and sentinel node on 02/07/12. Over all she feels that she is doing well.  PE: VITAL SIGNS: BP 164/102  Pulse 88  Temp 97.7 F (36.5 C) (Temporal)  Resp 18  Ht 5' 5.5" (1.664 m)  Wt 189 lb 12.8 oz (86.093 kg)  BMI 31.10 kg/m2  The incision is healing nicely and there is no evidence of infection or hematoma, mild ecchymosis at the breast  T.  DATA REVIEWED: Pathology report: Diagnosis 1. Breast, lumpectomy, Right - INVASIVE DUCTAL CARCINOMA, GRADE II/III, SPANNING 1.8 CM. - DUCTAL CARCINOMA IN SITU, LOW GRADE. - THE SURGICAL RESECTION MARGINS ARE NEGATIVE FOR CARCINOMA. - SEE ONCOLOGY TABLE BELOW. 2. Lymph node, sentinel, biopsy, Right axillary - METASTATIC CARCINOMA IN 1 OF 1 LYMPH NODE (1/1).  IMPRESSION: Patient doing well.   PLAN: Her next visit will be in three weeks. Long ( ) discussion about path with patient, friend and son. Med onc next week.

## 2012-02-20 ENCOUNTER — Other Ambulatory Visit: Payer: Self-pay | Admitting: *Deleted

## 2012-02-20 DIAGNOSIS — C50919 Malignant neoplasm of unspecified site of unspecified female breast: Secondary | ICD-10-CM

## 2012-02-21 ENCOUNTER — Other Ambulatory Visit: Payer: Self-pay | Admitting: Emergency Medicine

## 2012-02-21 ENCOUNTER — Other Ambulatory Visit (HOSPITAL_BASED_OUTPATIENT_CLINIC_OR_DEPARTMENT_OTHER): Payer: Medicare Other | Admitting: Lab

## 2012-02-21 ENCOUNTER — Encounter: Payer: Self-pay | Admitting: Oncology

## 2012-02-21 ENCOUNTER — Ambulatory Visit (HOSPITAL_BASED_OUTPATIENT_CLINIC_OR_DEPARTMENT_OTHER): Payer: Medicare Other | Admitting: Oncology

## 2012-02-21 ENCOUNTER — Ambulatory Visit: Payer: Medicare Other

## 2012-02-21 VITALS — BP 166/91 | HR 91 | Temp 97.8°F | Resp 20 | Ht 65.5 in | Wt 188.1 lb

## 2012-02-21 DIAGNOSIS — C50919 Malignant neoplasm of unspecified site of unspecified female breast: Secondary | ICD-10-CM

## 2012-02-21 DIAGNOSIS — Z170 Estrogen receptor positive status [ER+]: Secondary | ICD-10-CM

## 2012-02-21 DIAGNOSIS — C773 Secondary and unspecified malignant neoplasm of axilla and upper limb lymph nodes: Secondary | ICD-10-CM

## 2012-02-21 DIAGNOSIS — C50911 Malignant neoplasm of unspecified site of right female breast: Secondary | ICD-10-CM

## 2012-02-21 LAB — CBC WITH DIFFERENTIAL/PLATELET
Basophils Absolute: 0 10*3/uL (ref 0.0–0.1)
EOS%: 2.5 % (ref 0.0–7.0)
HGB: 14.5 g/dL (ref 11.6–15.9)
LYMPH%: 27.5 % (ref 14.0–49.7)
MCH: 30.8 pg (ref 25.1–34.0)
MCV: 91 fL (ref 79.5–101.0)
MONO%: 7 % (ref 0.0–14.0)
RBC: 4.72 10*6/uL (ref 3.70–5.45)
RDW: 13.1 % (ref 11.2–14.5)

## 2012-02-21 LAB — COMPREHENSIVE METABOLIC PANEL (CC13)
AST: 20 U/L (ref 5–34)
Albumin: 4.3 g/dL (ref 3.5–5.0)
Alkaline Phosphatase: 87 U/L (ref 40–150)
BUN: 21 mg/dL (ref 7.0–26.0)
Potassium: 4.4 mEq/L (ref 3.5–5.1)
Total Bilirubin: 0.45 mg/dL (ref 0.20–1.20)

## 2012-02-21 NOTE — Progress Notes (Signed)
Checked in new patient. Patient son gave me his FMLA papers. No financial issues.

## 2012-02-21 NOTE — Progress Notes (Signed)
Heather Vega [id number] 02-03-1936 76 y.o. 02/21/2012 11:45 AM  CC  [name], MD [address] [place] [address] Dr. Elwyn Lade Dr. Chipper Herb   REASON FOR CONSULTATION:  76 year old female with new diagnosis of invasive ductal carcinoma of the right breast that is ER positive PR positive HER-2/neu equivocal grade 2 diagnosed October 2013.  STAGE:   Breast cancer, right breast   Primary site: Breast (Right)   Staging method: AJCC 7th Edition   Pathologic: Stage IIA (T1c, N1a, cM0) signed by Currie Paris, MD on 02/09/2012  5:43 PM   Summary: Stage IIA (T1c, N1a, cM0)  REFERRING PHYSICIAN: Dr. Cyndia Bent  HISTORY OF PRESENT ILLNESS:  Heather Vega is a 76 y.o. female.  Would medical history significant for diabetes hearing loss and carpal tunnel syndrome. Recently patient had a screening mammogram performed that showed an abnormality in the right breast. On 1010 she had a diagnostic mammogram that showed a mass at 11:30 o'clock position she went on to have an ultrasound-guided biopsy that revealed an invasive ductal carcinoma that was ER positive PR positive HER-2/neu equivocal with amplification negative Ki-67 was 11%. Patient has had a lumpectomy performed on 02/07/2012 the final pathology revealed a grade 21.8 cm invasive ductal carcinoma with low grade ductal carcinoma in situ all her surgical margins were negative. Patient had sentinel lymph node biopsy performed that showed one lymph node positive for macro metastases with no extracapsular extension the tumor was ER +100% PR +100% HER-2/neu repeated showed 2.04 amplification which is in the equivocal range. Average number of HER-2 signals personnel was 2.50 40 tumor cells were evaluated. Final pathologic staging was T1 C. N1 A. Postoperatively patient is doing well and is without any significant complaints. She is seen in the breast clinic for discussion of treatment options. Her case was  discussed at the multidisciplinary breast conference. Today she was accompanied by her son her daughter and her friend 30 years.   Past Medical History: Past Medical History  Diagnosis Date  . Hyperlipidemia     doesn't require meds  . Asthma     pt states its "not bad"  . Back pain     buldging disc and pt says 2 are "flat"  . Hemorrhoids   . History of colon polyps   . Diverticulosis   . Cancer     breast right  . Diabetes mellitus without complication     takes Metformin daily  . Hypothyroidism     hx of;but has been off of meds >24yrs ago  . Hard of hearing     Past Surgical History: Past Surgical History  Procedure Date  . Nose surgery 1953  . Tonsillectomy 1955  . Abdominal hysterectomy   . Nasal fracture surgery     as a child x 2  . Tonsillectomy   . Breast surgery 70's    right  . Appendectomy   . Bilateral cataract surgery   . Colonoscopy   . Neuroplasty / transposition median nerve at carpal tunnel bilateral   . Cardiac catheterization 20+yrs ago  . Breast lumpectomy with needle localization and axillary sentinel lymph node bx 02/07/2012    Procedure: BREAST LUMPECTOMY WITH NEEDLE LOCALIZATION AND AXILLARY SENTINEL LYMPH NODE BX;  Surgeon: Currie Paris, MD;  Location: [facility] OR;  Service: General;  Laterality: Right;  Right needle loc lumpectomy and Senitel Lymph Node Biopsy     Family History: No family history on file.  Social History History  Substance Use Topics  . Smoking status: Former Games developer  . Smokeless tobacco: Not on file     Comment: quit smoking 56yrs ago  . Alcohol Use: No    Allergies: No Known Allergies  Current Medications: Current Outpatient Prescriptions  Medication Sig Dispense Refill  . aspirin 81 MG tablet Take 81 mg by mouth daily.      Marland Kitchen CINNAMON PO Take 1 tablet by mouth daily.      . metFORMIN (GLUCOPHAGE) 1000 MG tablet Take 1,000 mg by mouth 2 (two) times daily with a meal.      . Multiple Vitamins-Minerals  (MULTIVITAMIN WITH MINERALS) tablet Take 1 tablet by mouth daily.      . Vitamins C E (CRANBERRY CONCENTRATE PO) Take 1 tablet by mouth daily.      Marland Kitchen HYDROcodone-acetaminophen (NORCO) 5-325 MG per tablet Take 1 tablet by mouth every 4 (four) hours as needed for pain.  30 tablet  0    OB/GYN History: Menarche at age 62 patient underwent surgical menopause in 1972 she had been on hormone replacement therapy for 2 years she quit in 1994. Patient has had 4 term births first live birth was at 62.  Fertility Discussion: Not applicable Prior History of Cancer: No prior history of cancers  Health Maintenance:  Colonoscopy 2012 Bone Density done Last PAP smear 1994  ECOG PERFORMANCE STATUS: 0 - Asymptomatic  Genetic Counseling/testing: Not recommended at this time  REVIEW OF SYSTEMS:  A comprehensive review of systems was negative.  PHYSICAL EXAMINATION: Blood pressure 166/91, pulse 91, temperature 97.8 F (36.6 C), temperature source Oral, resp. rate 20, height 5' 5.5" (1.664 m), weight 188 lb 1.6 oz (85.322 kg).  FAO:ZHYQM, healthy, no distress, well nourished and well developed SKIN: skin color, texture, turgor are normal HEAD: Normocephalic EYES: PERRLA, EOMI, Conjunctiva are pink and non-injected EARS: External ears normal OROPHARYNX:no exudate, no erythema and lips, buccal mucosa, and tongue normal  NECK: supple, no adenopathy LYMPH:  no palpable lymphadenopathy BREAST:surgical scars noted well healed surgical scar is noted in the right breast no skin changes otherwise no nipple discharge no other palpable masses left breast no masses or nipple discharge LUNGS: clear to auscultation and percussion HEART: regular rate & rhythm ABDOMEN:abdomen soft, non-tender, normal bowel sounds and no masses or organomegaly BACK: No CVA tenderness, Range of motion is normal EXTREMITIES:less then 2 second capillary refill  NEURO: alert & oriented x 3 with fluent speech, no focal motor/sensory  deficits, gait normal, reflexes normal and symmetric     STUDIES/RESULTS: Chest 2 View  02/07/2012  *RADIOLOGY REPORT*  Clinical Data: Right breast cancer, preoperative assessment  CHEST - 2 VIEW  Comparison: None  Findings: Normal heart size and pulmonary vascularity. Mildly tortuous thoracic aorta. Minimal peribronchial thickening. No pulmonary infiltrate, pleural effusion, or pneumothorax. Bones demineralized with endplate spur formation thoracic spine.  IMPRESSION: Minimal bronchitic changes.   Original Report Authenticated By: Ulyses Southward, M.D.    Nm Sentinel Node Inj-no Rpt (breast)  02/07/2012  CLINICAL DATA: right breast cancer   Sulfur colloid was injected intradermally by the nuclear medicine  technologist for breast cancer sentinel node localization.     [place] Wire Localization Right  02/07/2012  *RADIOLOGY REPORT*  Clinical Data:  Presents for ultrasound-guided needle localization of a known biopsy-proven malignancy over the 11:30 position of the right breast. The mass/clip will be localized.  NEEDLE LOCALIZATION USING ULTRASOUND GUIDANCE AND SPECIMEN RADIOGRAPH  Comparison: Previous exams.  Patient presents for needle localization prior  to surgical excision.  I met with the patient and we discussed the procedure of needle localization including benefits and alternatives. We discussed the high likelihood of a successful procedure. We discussed the risks of the procedure, including infection, bleeding, tissue injury, and further surgery. Informed, written consent was given.  Using ultrasound guidance, sterile technique, 2% lidocaine and a 9 cm ultra-wire, the targeted mass/clip was localized using a medial to lateral approach.  The films are marked for Dr. Jamey Ripa. A cutaneous "X"  was placed over the right breast mass.  The Specimen radiograph is performed at , and confirms the wire and targeted clip/mass present in the tissue sample.  The specimen is marked for pathology.  IMPRESSION: Needle  localization right breast.  No apparent complications.   Original Report Authenticated By: Elberta Fortis, M.D.    Mm Breast Surgical Specimen  02/07/2012  *RADIOLOGY REPORT*  Clinical Data:  Presents for ultrasound-guided needle localization of a known biopsy-proven malignancy over the 11:30 position of the right breast. The mass/clip will be localized.  NEEDLE LOCALIZATION USING ULTRASOUND GUIDANCE AND SPECIMEN RADIOGRAPH  Comparison: Previous exams.  Patient presents for needle localization prior to surgical excision.  I met with the patient and we discussed the procedure of needle localization including benefits and alternatives. We discussed the high likelihood of a successful procedure. We discussed the risks of the procedure, including infection, bleeding, tissue injury, and further surgery. Informed, written consent was given.  Using ultrasound guidance, sterile technique, 2% lidocaine and a 9 cm ultra-wire, the targeted mass/clip was localized using a medial to lateral approach.  The films are marked for Dr. Jamey Ripa. A cutaneous "X"  was placed over the right breast mass.  The Specimen radiograph is performed at , and confirms the wire and targeted clip/mass present in the tissue sample.  The specimen is marked for pathology.  IMPRESSION: Needle localization right breast.  No apparent complications.   Original Report Authenticated By: Elberta Fortis, M.D.      LABS:    Chemistry      Component Value Date/Time   NA 138 01/31/2012 1030   K 4.9 01/31/2012 1030   CL 98 01/31/2012 1030   CO2 28 01/31/2012 1030   BUN 18 01/31/2012 1030   CREATININE 0.78 01/31/2012 1030      Component Value Date/Time   CALCIUM 9.8 01/31/2012 1030   ALKPHOS 75 01/31/2012 1030   AST 32 01/31/2012 1030   ALT 23 01/31/2012 1030   BILITOT 0.4 01/31/2012 1030      Lab Results  Component Value Date   WBC 7.4 02/21/2012   HGB 14.5 02/21/2012   HCT 43.0 02/21/2012   MCV 91.0 02/21/2012   PLT 235 02/21/2012        PATHOLOGY: ADDITIONAL INFORMATION: 1. CHROMOGENIC IN-SITU HYBRIDIZATION Interpretation: THE HER 2:CEP 17 RATIO IS 2.04 WHICH IS IN THE EQUIVOCAL RANGE FOR HER2 AMPLIFICATION. THE AVERAGE NUMBER OF HER2 SIGNALS PER TUMOR CELL IS 2.50. OF NOTE, 40 TUMOR CELLS WERE EVALUATED. Reference range: Ratio: HER2:CEP 17 < 1.8 - gene amplification not observed Ratio: HER2:CEP 17 1.8 - 2.2 - equivocal results Ratio: HER2:CEP 17 > 2.2 - gene amplification observed Pecola Leisure MD Pathologist, Electronic Signature ( Signed 02/15/2012) FINAL DIAGNOSIS Diagnosis 1. Breast, lumpectomy, Right - INVASIVE DUCTAL CARCINOMA, GRADE II/III, SPANNING 1.8 CM. - DUCTAL CARCINOMA IN SITU, LOW GRADE. - THE SURGICAL RESECTION MARGINS ARE NEGATIVE FOR CARCINOMA. - SEE ONCOLOGY TABLE BELOW. 2. Lymph node, sentinel, biopsy, Right axillary - METASTATIC  CARCINOMA IN 1 OF 1 LYMPH NODE (1/1). Microscopic Comment 1. BREAST, INVASIVE TUMOR, WITH LYMPH NODE SAMPLING Specimen, including laterality: Right breast Procedure: Lumpectomy 1 of 3 FINAL for Knoop, Lavonn ([id number]) Microscopic Comment(continued) Grade: 2 Tubule formation: 2 Nuclear pleomorphism: 3 Mitotic:2 Tumor size (gross measurement): 1.8 cm Margins: Invasive, distance to closest margin: 0.5 cm to posterior margin (gross measurement) In-situ, distance to closest margin: 0.5 cm to posterior margin (gross measurement) Lymphovascular invasion: Not definitively identified Ductal carcinoma in situ: Present Grade: Low grade Extensive intraductal component: Not identified Lobular neoplasia: Not identified Tumor focality: Unifocal Treatment effect: N/A Extent of tumor: Confined to breast parenchyma Lymph nodes: # examined: 1 Lymph nodes with metastasis:1 (macrometastasis with no extracapsular extension) Breast prognostic profile: [phone] Estrogen receptor: 100%, strong staining Progesterone receptor: 100%, strong staining Her 2  neu: No amplification was detected, the ratio was 1.74. Her 2 neu by [facility] will be repeated on the current case and the results reported separately Ki-67: 11% Non-neoplastic breast: Vascular calcifications and healing biopsy site TNM: pT1c, pN1a (JBK:caf 02/09/12) Pecola Leisure MD Pathologist, Electronic Signature (Case signed 02/09/2012) Specimen Gross and Clinical Information Specimen(s) Obtained: 1. Breast, lumpectomy, Right 2. Lymph node, sentinel, biopsy, Right axillary Specimen Clinical Information 1. Right breast cancer (tl) Gross   ASSESSMENT    76 year old female with  #1 invasive ductal carcinoma with ductal carcinoma in situ of the right breast status post lumpectomy with sentinel lymph node biopsy. The final pathology showed a 1.8 cm grade 2 invasive ductal carcinoma the tumor was ER +100% PR +100% HER-2/neu in the equivocal range of 2.04. One sentinel node was positive for macro metastasis without extracapsular extension. Patient has pathologic stage II.  #2 patient's HER-2/neu is in the equivocal range and this is concerning it is unclear whether she should get systemic adjuvant HER-2 based therapy or not. I have discussed the pathology with the family extensively. My recommendation is for them to send off the tissue for [facility] testing. If hermark is negative then she certainly would not be a candidate for adjuvant Herceptin. Then I would recommend that she have an Oncotype DX testing performed to see her breast cancer recurrence score. I discussed Oncotype DX with the patient and her family extensively as well and literature was given to them.  #3 patient certainly will need radiation therapy and she is scheduled to be seen by Dr. Chipper Herb tomorrow.  PLAN:    #1 patient will proceed with hermark testing on her tumor to determine whether she is HER-2 negative for positive.  #2 once I have the results of this I will plan on seeing the patient back in mid December. I  reminded them that it may take [place] about 2 weeks to get the test results back then we will sit down and discuss implications of that. I had quite a lengthy discussion with the family today.        Thank you so much for allowing me to participate in the care of Gursimran Souder. I will continue to follow up the patient with you and assist in her care.  All questions were answered. The patient knows to call the clinic with any problems, questions or concerns. We can certainly see the patient much sooner if necessary.  I spent 110 minutes counseling the patient face to face. The total time spent in the appointment was 60 minutes.  Drue Second, MD Medical/Oncology [facility] [phone] (beeper) [phone] (Office)  02/21/2012, 11:45 AM

## 2012-02-22 ENCOUNTER — Telehealth: Payer: Self-pay | Admitting: Oncology

## 2012-02-22 ENCOUNTER — Ambulatory Visit
Admission: RE | Admit: 2012-02-22 | Discharge: 2012-02-22 | Disposition: A | Discharge status: Home / Self Care | Payer: Medicare Other | Source: Ambulatory Visit | Attending: Radiation Oncology | Admitting: Radiation Oncology

## 2012-02-22 ENCOUNTER — Encounter: Payer: Self-pay | Admitting: Radiation Oncology

## 2012-02-22 VITALS — BP 170/94 | HR 82 | Temp 97.6°F | Resp 20 | Ht 65.0 in | Wt 187.7 lb

## 2012-02-22 DIAGNOSIS — C50911 Malignant neoplasm of unspecified site of right female breast: Secondary | ICD-10-CM

## 2012-02-22 DIAGNOSIS — C50919 Malignant neoplasm of unspecified site of unspecified female breast: Secondary | ICD-10-CM | POA: Insufficient documentation

## 2012-02-22 HISTORY — DX: Unspecified cataract: H26.9

## 2012-02-22 HISTORY — DX: Essential (primary) hypertension: I10

## 2012-02-22 HISTORY — DX: Allergy, unspecified, initial encounter: T78.40XA

## 2012-02-22 NOTE — Progress Notes (Signed)
New Consult Right Breast Cancer 02/07/12 Right Breast lumpectomy= Upper Outer Quadrant, Invasive Ductal Carcinoma,grade III,1.8cm,DCIS,low grade,surgical resection margins neg.Metastatic Carcinoma in 1 lymph node(1/1) ER/PR=+, Dr. Cyndia Bent DX=01/05/12 Right Breast Cancer HX benign mass removed from breast many years ago Legally Separated, alert,oriented x3, ,  Right breast with greenish/yellow bruising on incision near nipple,  Twinges occasionally    Allergies: NKDA

## 2012-02-22 NOTE — Telephone Encounter (Signed)
lmonvm advising the pt of her appt on 03/09/2012@12 :30pm.

## 2012-02-22 NOTE — Addendum Note (Signed)
Encounter addended by: Delynn Flavin, RN on: 02/22/2012  7:23 PM<BR>     Documentation filed: Charges VN

## 2012-02-22 NOTE — Progress Notes (Signed)
[facility] Radiation Oncology NEW PATIENT EVALUATION  Name: Heather Vega MRN: [id number]  Date:   02/22/2012           DOB: 01/14/1936  Status: outpatient   CC: [name], MD  Currie Paris, MD , Dr. Drue Second   REFERRING PHYSICIAN: Jamey Ripa Reola Mosher, MD   DIAGNOSIS: Stage II A. (T1, N1, M0) invasive ductal/DCIS of the right breast   HISTORY OF PRESENT ILLNESS:  Heather Vega is a 76 y.o. female who is seen today for the courtesy Dr. Jamey Ripa for discussion of possible radiation therapy in the management of her stage II A. (T1, N1, M0) invasive ductal/DCIS of the right breast. I do not have her [facility] mammograms from late September or early October, but she was found to have a suspicious "spot" along the upper-outer quadrant of the right breast. A needle core biopsy on 01/05/2012 was diagnostic for invasive ductal carcinoma. She was seen by Dr. Jamey Ripa who performed a right partial mastectomy and sentinel lymph node biopsy. She is found to have and 1.8 cm invasive ductal carcinoma along with low-grade DCIS. Surgical margins were 0.5 cm, posteriorly for both invasive and DCIS. Her invasive disease was strongly ER/PR positive with a low proliferation Ki-67 index of 11%. A single sentinel lymph node contained a macro metastasis with no extracapsular extension. HER-2/neu was equivocal. She was presented at the morning breast cancer conference today and was seen in consultation by Dr. Welton Flakes yesterday. Dr. Welton Flakes plans on retesting her HER-2/neu, and if negative then obtaining an Oncotype DX. She is without complaints today. She remains quite active.  PREVIOUS RADIATION THERAPY: No   PAST MEDICAL HISTORY:  has a past medical history of Hyperlipidemia; Asthma; Back pain; Hemorrhoids; History of colon polyps; Diverticulosis; Cancer; Hard of hearing; Diabetes mellitus without complication; Allergy; Cataract; Hypothyroidism; and Hypertension.     PAST SURGICAL  HISTORY:  Past Surgical History  Procedure Date  . Nose surgery 1953  . Tonsillectomy 1955  . Nasal fracture surgery     as a child x 2  . Tonsillectomy   . Breast surgery 70's    right  . Appendectomy   . Bilateral cataract surgery   . Colonoscopy   . Neuroplasty / transposition median nerve at carpal tunnel bilateral   . Cardiac catheterization 20+yrs ago  . Breast lumpectomy with needle localization and axillary sentinel lymph node bx 02/07/2012    Procedure: BREAST LUMPECTOMY WITH NEEDLE LOCALIZATION AND AXILLARY SENTINEL LYMPH NODE BX;  Surgeon: Currie Paris, MD;  Location: [facility] OR;  Service: General;  Laterality: Right;  Right needle loc lumpectomy and Senitel Lymph Node Biopsy   . Abdominal hysterectomy     ovaries intact     FAMILY HISTORY: family history includes Cancer in her father. Her father had colon cancer but died from complications of Parkinson's disease at age 24. Her mother died from what sounds like rheumatic heart disease at 27. No family history of breast cancer.   SOCIAL HISTORY:  reports that she has quit smoking. She does not have any smokeless tobacco history on file. She reports that she does not drink alcohol or use illicit drugs. Separated, 4 children. She worked at [employer] and also is a Engineer, mining.   ALLERGIES: Review of patient's allergies indicates no known allergies.   MEDICATIONS:  Current Outpatient Prescriptions  Medication Sig Dispense Refill  . aspirin 81 MG tablet Take 81 mg by mouth daily.      Marland Kitchen  CINNAMON PO Take 1 tablet by mouth 2 (two) times daily.       Marland Kitchen ibuprofen (ADVIL,MOTRIN) 200 MG tablet Take 200 mg by mouth every 6 (six) hours as needed.      . metFORMIN (GLUCOPHAGE) 1000 MG tablet Take 1,000 mg by mouth 2 (two) times daily with a meal.      . Multiple Vitamins-Minerals (MULTIVITAMIN WITH MINERALS) tablet Take 1 tablet by mouth daily.      . naproxen sodium (ANAPROX) 220 MG tablet Take 220 mg by mouth as needed.       . Nutritional Supplements (FIBER FORMULA) POWD Take 30 mLs by mouth daily. tkes 2-3 tablespoons daily for constipation      . Vitamins C E (CRANBERRY CONCENTRATE PO) Take 1 tablet by mouth 2 (two) times daily.          REVIEW OF SYSTEMS:  Pertinent items are noted in HPI.    PHYSICAL EXAM:  height is 5\' 5"  (1.651 m) and weight is 187 lb 11.2 oz (85.14 kg). Her oral temperature is 97.6 F (36.4 C). Her blood pressure is 170/94 and her pulse is 82. Her respiration is 20 and oxygen saturation is 99%.   Alert and oriented. Head and neck examination: Grossly unremarkable. Nodes: Without palpable cervical, supraclavicular, or axillary lymphadenopathy. Chest: Lungs clear. Back: Without spinal or CVA tenderness. Heart: Regular rate and rhythm. Breasts: There is a partial mastectomy wound along the upper-outer quadrant of the right breast from 10 to 12:00. There is minimal ecchymosis. No masses are appreciated. Left breast without masses or lesions. Abdomen without hepatomegaly. Extremities: Without edema. Neurologic examination: Grossly nonfocal.   LABORATORY DATA:  Lab Results  Component Value Date   WBC 7.4 02/21/2012   HGB 14.5 02/21/2012   HCT 43.0 02/21/2012   MCV 91.0 02/21/2012   PLT 235 02/21/2012   Lab Results  Component Value Date   NA 140 02/21/2012   K 4.4 02/21/2012   CL 102 02/21/2012   CO2 29 02/21/2012   Lab Results  Component Value Date   ALT 26 02/21/2012   AST 20 02/21/2012   ALKPHOS 87 02/21/2012   BILITOT 0.45 02/21/2012      IMPRESSION: Stage II A. (T1 N1 M0) invasive ductal/DCIS of the right breast. I explained to the patient and her children that her local treatment options include mastectomy versus partial mastectomy followed by radiation therapy. She chose breast preservation. Her surgical margins are quite adequate, and she is a candidate for breast preservation. Based on the Z. 11 trial, she does not need an axillary dissection provided that she receives  radiation therapy. She would not be a candidate for hypo-fractionated treatment with axillary/nodal irradiation. I spoke with Dr. Welton Flakes, and she would like a decision on whether not she would benefit from chemotherapy after her requesting for HER-2/neu, and possible Oncotype DX testing. She'll contact me after she has made her decision. We discussed the potential acute and late toxicities of radiation therapy which should be relatively well tolerated. I offered her treatment closer to home in [place], but she prefers to drive to [facility] for radiation therapy. Following radiation therapy, she is certainly a candidate for adjuvant hormone therapy.   PLAN: As discussed above. I ask that Dr. Welton Flakes contact me after she is may decision regarding systemic therapy.  I spent 60 minutes minutes face to face with the patient and more than 50% of that time was spent in counseling and/or coordination of care.

## 2012-02-27 ENCOUNTER — Telehealth: Payer: Self-pay | Admitting: *Deleted

## 2012-02-27 NOTE — Telephone Encounter (Signed)
Pt's son Nadine Counts called states" Do I need physically be at her next appt 12/13, is there a way I can be confrenced in on the appt? Also, scheduling question, can we try to get [place] home to [place] the [place] over holidays.?"  Son lives approx 4 hours away. CB # [phone]   Pt does not have any appt past 12/13 at this time. Will review with MD

## 2012-02-27 NOTE — Telephone Encounter (Signed)
Returned call per MD advised he does not need to be at appt on 12/13, MD will try to conference, however signal for cell phones is poor and his mom will be able to get back to [place]. Nadine Counts thanked [place] for the call voiced concerns regarding his mothers health and is appreciative of working with schedule to accomidate his [place] going [place]. Pt will be out of town from 12/22-12/29.

## 2012-02-27 NOTE — Telephone Encounter (Signed)
Please let the son know he does not need to be here. I will try to do conferences however our signal for cell phones is poor.   We will make sure his mom goes to [place] for the [facility]

## 2012-03-06 ENCOUNTER — Encounter (INDEPENDENT_AMBULATORY_CARE_PROVIDER_SITE_OTHER): Payer: Self-pay | Admitting: Surgery

## 2012-03-09 ENCOUNTER — Telehealth: Payer: Self-pay | Admitting: Oncology

## 2012-03-09 ENCOUNTER — Ambulatory Visit: Payer: Medicare Other | Admitting: Oncology

## 2012-03-09 ENCOUNTER — Ambulatory Visit (INDEPENDENT_AMBULATORY_CARE_PROVIDER_SITE_OTHER): Payer: Medicare Other | Admitting: Surgery

## 2012-03-09 ENCOUNTER — Encounter (INDEPENDENT_AMBULATORY_CARE_PROVIDER_SITE_OTHER): Payer: Self-pay | Admitting: Surgery

## 2012-03-09 ENCOUNTER — Ambulatory Visit (HOSPITAL_BASED_OUTPATIENT_CLINIC_OR_DEPARTMENT_OTHER): Payer: Medicare Other | Admitting: Oncology

## 2012-03-09 ENCOUNTER — Encounter: Payer: Self-pay | Admitting: Oncology

## 2012-03-09 VITALS — BP 138/80 | HR 88 | Resp 16 | Ht 65.5 in | Wt 189.0 lb

## 2012-03-09 VITALS — BP 168/96 | HR 91 | Temp 98.4°F | Resp 20 | Ht 65.5 in | Wt 189.5 lb

## 2012-03-09 DIAGNOSIS — C773 Secondary and unspecified malignant neoplasm of axilla and upper limb lymph nodes: Secondary | ICD-10-CM

## 2012-03-09 DIAGNOSIS — Z170 Estrogen receptor positive status [ER+]: Secondary | ICD-10-CM

## 2012-03-09 DIAGNOSIS — C50919 Malignant neoplasm of unspecified site of unspecified female breast: Secondary | ICD-10-CM

## 2012-03-09 DIAGNOSIS — C50911 Malignant neoplasm of unspecified site of right female breast: Secondary | ICD-10-CM

## 2012-03-09 DIAGNOSIS — C50419 Malignant neoplasm of upper-outer quadrant of unspecified female breast: Secondary | ICD-10-CM

## 2012-03-09 MED ORDER — LORAZEPAM 0.5 MG PO TABS: 0.5000 mg | ORAL_TABLET | Freq: Four times a day (QID) | ORAL | Status: DC | PRN

## 2012-03-09 MED ORDER — ONDANSETRON HCL 8 MG PO TABS: 8.0000 mg | ORAL_TABLET | Freq: Two times a day (BID) | ORAL | Status: DC

## 2012-03-09 MED ORDER — PROCHLORPERAZINE 25 MG RE SUPP: 25.0000 mg | Freq: Two times a day (BID) | RECTAL | Status: DC | PRN

## 2012-03-09 MED ORDER — DEXAMETHASONE 4 MG PO TABS: 8.0000 mg | ORAL_TABLET | Freq: Two times a day (BID) | ORAL | Status: DC

## 2012-03-09 MED ORDER — LIDOCAINE-PRILOCAINE 2.5-2.5 % EX CREA: TOPICAL_CREAM | CUTANEOUS | Status: DC | PRN

## 2012-03-09 MED ORDER — PROCHLORPERAZINE MALEATE 10 MG PO TABS: 10.0000 mg | ORAL_TABLET | Freq: Four times a day (QID) | ORAL | Status: DC | PRN

## 2012-03-09 NOTE — Progress Notes (Signed)
Heather Vega    [id number] 03/09/2012    1935-04-16   CC: Post op lumpectomy  HPI: The patient returns for post op follow-up. She underwent a right lumpectomy and sentinel node on 02/07/12. Over all she feels that she is doing well.she has a followup visit with the medical oncologist later today. She has seen the radiation therapist and will likely have radiation therapy.  PE: VITAL SIGNS: BP 138/80  Pulse 88  Resp 16  Ht 5' 5.5" (1.664 m)  Wt 189 lb (85.73 kg)  BMI 30.97 kg/m2  The incision is healing nicely and there is no evidence of infection or hematoma, mild ecchymosis at the breast has resolved. Still a little firm at the lumpectomy site. The sentinel node site is soft.    DATA REVIEWED: I have looked over the notes from the radiation oncologist and medical oncologist  IMPRESSION: Patient doing well.   PLAN: RTC three months, sooner PRN

## 2012-03-09 NOTE — Telephone Encounter (Signed)
Added inj appts q3w day after TCH per 12/13 pof. Pt was already contacted re 12/19 appt and is to get schedule when she comes in. See previous note.

## 2012-03-09 NOTE — Progress Notes (Signed)
OFFICE PROGRESS NOTE  CC  [facility], MD [address] [place] [address] Dr. Cyndia Bent Dr. Chipper Herb  DIAGNOSIS: 76 year old female with new diagnosis of invasive ductal carcinoma that is ER positive PR positive HER-2/neu positive by [facility]  PRIOR THERAPY:  #1 patient was originally seenBy me on 02/21/2012 when she was diagnosed with invasive ductal carcinoma of the right breast stage II a with a positive lymph node. She underwent a lumpectomy and sentinel lymph node biopsy.The final pathology revealed a 1.8 cm invasive ductal carcinoma with low grade ductal carcinoma in situ the tumor was grade 2 ER positive PR positive Ki-67 was 11%. HER-2/neu was equivocal by [facility].  #2 she had a sentinel lymph node biopsy one node was positive for metastatic disease. Making her a stage II A.  #3 patient and I and her family had an extensive discussion at the original meeting and we sent her tumor for HER-2/neu testing by [facility]. By [facility]  HER-2/neu is positive.  CURRENT THERAPY:Patient will proceed with her 2-based therapy consisting of Taxotere carboplatinum and Herceptin beginning on 03/31/2011  INTERVAL HISTORY: Heather Vega 76 y.o. female returns forFollowup visit today. Overall she is doing well she is without any significant complaints. She denies any nausea vomiting fevers chills night sweats headaches shortness of breath chest pains palpitation her right breast is a little bit tender from her surgery. Otherwise she has no complaints. She is accompanied by her best friend. And her son and I spoke over the phone at the time of the visit.  MEDICAL HISTORY: Past Medical History  Diagnosis Date  . Hyperlipidemia     doesn't require meds  . Asthma     pt states its "not bad"  . Back pain     buldging disc and pt says 2 are "flat"  . Hemorrhoids   . History of colon polyps   . Diverticulosis   . Cancer     breast right  . Hard of hearing   . Diabetes  mellitus without complication     takes Metformin daily,Type II  . Allergy   . Cataract     b/l surgery  . Hypothyroidism     hx of;but has been off of meds >83yrs ago  . Hypertension     history, was on b/p  meds years ago    ALLERGIES:   has no known allergies.  MEDICATIONS:  Current Outpatient Prescriptions  Medication Sig Dispense Refill  . aspirin 81 MG tablet Take 81 mg by mouth daily.      Marland Kitchen CINNAMON PO Take 1 tablet by mouth 2 (two) times daily.       . metFORMIN (GLUCOPHAGE) 1000 MG tablet Take 1,000 mg by mouth 2 (two) times daily with a meal.      . Multiple Vitamins-Minerals (MULTIVITAMIN WITH MINERALS) tablet Take 1 tablet by mouth daily.      . naproxen sodium (ANAPROX) 220 MG tablet Take 220 mg by mouth as needed.      . Nutritional Supplements (FIBER FORMULA) POWD Take 30 mLs by mouth daily. tkes 2-3 tablespoons daily for constipation      . Vitamins C E (CRANBERRY CONCENTRATE PO) Take 1 tablet by mouth 2 (two) times daily.         SURGICAL HISTORY:  Past Surgical History  Procedure Date  . Nose surgery 1953  . Tonsillectomy 1955  . Nasal fracture surgery     as a child x 2  .  Tonsillectomy   . Breast surgery 70's    right  . Appendectomy   . Bilateral cataract surgery   . Colonoscopy   . Neuroplasty / transposition median nerve at carpal tunnel bilateral   . Cardiac catheterization 20+yrs ago  . Breast lumpectomy with needle localization and axillary sentinel lymph node bx 02/07/2012    Procedure: BREAST LUMPECTOMY WITH NEEDLE LOCALIZATION AND AXILLARY SENTINEL LYMPH NODE BX;  Surgeon: Currie Paris, MD;  Location: [facility] OR;  Service: General;  Laterality: Right;  Right needle loc lumpectomy and Senitel Lymph Node Biopsy   . Abdominal hysterectomy     ovaries intact    REVIEW OF SYSTEMS:  Pertinent items are noted in HPI.   HEALTH MAINTENANCE:   PHYSICAL EXAMINATION: Blood pressure 168/96, pulse 91, temperature 98.4 F (36.9 C), temperature  source Oral, resp. rate 20, height 5' 5.5" (1.664 m), weight 189 lb 8 oz (85.957 kg). Body mass index is 31.05 kg/(m^2). ECOG PERFORMANCE STATUS: 0 - Asymptomatic   General appearance: alert, cooperative and appears stated age Resp: clear to auscultation bilaterally Back: symmetric, no curvature. ROM normal. No CVA tenderness. Cardio: regular rate and rhythm GI: soft, non-tender; bowel sounds normal; no masses,  no organomegaly Extremities: extremities normal, atraumatic, no cyanosis or edema Neurologic: Grossly normal   LABORATORY DATA: Lab Results  Component Value Date   WBC 7.4 02/21/2012   HGB 14.5 02/21/2012   HCT 43.0 02/21/2012   MCV 91.0 02/21/2012   PLT 235 02/21/2012      Chemistry      Component Value Date/Time   NA 140 02/21/2012 1052   NA 138 01/31/2012 1030   K 4.4 02/21/2012 1052   K 4.9 01/31/2012 1030   CL 102 02/21/2012 1052   CL 98 01/31/2012 1030   CO2 29 02/21/2012 1052   CO2 28 01/31/2012 1030   BUN 21.0 02/21/2012 1052   BUN 18 01/31/2012 1030   CREATININE 0.8 02/21/2012 1052   CREATININE 0.78 01/31/2012 1030      Component Value Date/Time   CALCIUM 10.0 02/21/2012 1052   CALCIUM 9.8 01/31/2012 1030   ALKPHOS 87 02/21/2012 1052   ALKPHOS 75 01/31/2012 1030   AST 20 02/21/2012 1052   AST 32 01/31/2012 1030   ALT 26 02/21/2012 1052   ALT 23 01/31/2012 1030   BILITOT 0.45 02/21/2012 1052   BILITOT 0.4 01/31/2012 1030       RADIOGRAPHIC STUDIES:  No results found.  ASSESSMENT: 76 year old female with  #1 stage II A. (T1 C. N1 a  cM0) invasive ductal carcinoma Measuring 1.8 cm ER positive PR positive HER-2/neu positive. One sentinel node was positive for metastatic disease.  #2 patient is a good candidate for adjuvant HER-2-based therapy. We discussed treatment with Taxotere carboplatinum and Herceptin. The Taxotere carboplatinum will be given every 3 weeks with day 2 Neulasta for a total of 6 cycles. The Herceptin will be given weekly for the  duration of the chemotherapy and then changed to every 3 weeks to complete one year of Herceptin therapy.  #3 because patient has had a lumpectomy she will also require radiation therapy the patient has been seen by Dr. Ballard Russell regarding this we will refer her back to Dr. Dayton Scrape after she completes her chemotherapy.   PLAN:   #1 patient will need a Port-A-Cath placed as well as Dr. Jamey Ripa to do this.  #2 patient will have an echocardiogram performed she will also be seen by Dr. Teressa Lower. She will  need chemotherapy teaching class. And our plan is to get her started on chemotherapy on 03/30/2012.   All questions were answered. The patient knows to call the clinic with any problems, questions or concerns. We can certainly see the patient much sooner if necessary.  I spent 40 minutes counseling the patient face to face. The total time spent in the appointment was 30 minutes.    Drue Second, MD Medical/Oncology [facility] [phone] (beeper) [phone] (Office)  03/09/2012, 12:58 PM

## 2012-03-09 NOTE — Patient Instructions (Signed)
Arline Asp again in 3 months. Call if you have any problems or questions

## 2012-03-09 NOTE — Telephone Encounter (Signed)
S/w the pt and she is aware of her dec 19th appts and she is aware to get her schedules at that time

## 2012-03-09 NOTE — Patient Instructions (Addendum)
Proceed with chemotherapy on 03/30/12  Need:  Port a cath placement Echocardiogram Dr. Gala Romney for cardiology evaluation consult Chemotherapy class   Docetaxel injection What is this medicine? DOCETAXEL (doe se TAX el) is a chemotherapy drug. It targets fast dividing cells, like cancer cells, and causes these cells to die. This medicine is used to treat many types of cancers like breast cancer, certain stomach cancers, head and neck cancer, lung cancer, and prostate cancer. This medicine may be used for other purposes; ask your health care provider or pharmacist if you have questions. What should I tell my health care provider before I take this medicine? They need to know if you have any of these conditions: -infection (especially a virus infection such as chickenpox, cold sores, or herpes) -liver disease -low blood counts, like low white cell, platelet, or red cell counts -an unusual or allergic reaction to docetaxel, polysorbate 80, other chemotherapy agents, other medicines, foods, dyes, or preservatives -pregnant or trying to get pregnant -breast-feeding How should I use this medicine? This drug is given as an infusion into a vein. It is administered in a hospital or clinic by a specially trained health care professional. Talk to your pediatrician regarding the use of this medicine in children. Special care may be needed. Overdosage: If you think you have taken too much of this medicine contact a poison control center or emergency room at once. NOTE: This medicine is only for you. Do not share this medicine with others. What if I miss a dose? It is important not to miss your dose. Call your doctor or health care professional if you are unable to keep an appointment. What may interact with this medicine? -cyclosporine -erythromycin -ketoconazole -medicines to increase blood counts like filgrastim, pegfilgrastim, sargramostim -vaccines Talk to your doctor or health care  professional before taking any of these medicines: -acetaminophen -aspirin -ibuprofen -ketoprofen -naproxen This list may not describe all possible interactions. Give your health care provider a list of all the medicines, herbs, non-prescription drugs, or dietary supplements you use. Also tell them if you smoke, drink alcohol, or use illegal drugs. Some items may interact with your medicine. What should I watch for while using this medicine? Your condition will be monitored carefully while you are receiving this medicine. You will need important blood work done while you are taking this medicine. This drug may make you feel generally unwell. This is not uncommon, as chemotherapy can affect healthy cells as well as cancer cells. Report any side effects. Continue your course of treatment even though you feel ill unless your doctor tells you to stop. In some cases, you may be given additional medicines to help with side effects. Follow all directions for their use. Call your doctor or health care professional for advice if you get a fever, chills or sore throat, or other symptoms of a cold or flu. Do not treat yourself. This drug decreases your body's ability to fight infections. Try to avoid being around people who are sick. This medicine may increase your risk to bruise or bleed. Call your doctor or health care professional if you notice any unusual bleeding. Be careful brushing and flossing your teeth or using a toothpick because you may get an infection or bleed more easily. If you have any dental work done, tell your dentist you are receiving this medicine. Avoid taking products that contain aspirin, acetaminophen, ibuprofen, naproxen, or ketoprofen unless instructed by your doctor. These medicines may hide a fever. Do not become pregnant  while taking this medicine. Women should inform their doctor if they wish to become pregnant or think they might be pregnant. There is a potential for serious side  effects to an unborn child. Talk to your health care professional or pharmacist for more information. Do not breast-feed an infant while taking this medicine. What side effects may I notice from receiving this medicine? Side effects that you should report to your doctor or health care professional as soon as possible: -allergic reactions like skin rash, itching or hives, swelling of the face, lips, or tongue -low blood counts - This drug may decrease the number of white blood cells, red blood cells and platelets. You may be at increased risk for infections and bleeding. -signs of infection - fever or chills, cough, sore throat, pain or difficulty passing urine -signs of decreased platelets or bleeding - bruising, pinpoint red spots on the skin, black, tarry stools, nosebleeds -signs of decreased red blood cells - unusually weak or tired, fainting spells, lightheadedness -breathing problems -fast or irregular heartbeat -low blood pressure -mouth sores -nausea and vomiting -pain, swelling, redness or irritation at the injection site -pain, tingling, numbness in the hands or feet -swelling of the ankle, feet, hands -weight gain Side effects that usually do not require medical attention (report to your prescriber or health care professional if they continue or are bothersome): -bone pain -complete hair loss including hair on your head, underarms, pubic hair, eyebrows, and eyelashes -diarrhea -excessive tearing -changes in the color of fingernails -loosening of the fingernails -nausea -muscle pain -red flush to skin -sweating -weak or tired This list may not describe all possible side effects. Call your doctor for medical advice about side effects. You may report side effects to FDA at 1-800-FDA-1088. Where should I keep my medicine? This drug is given in a hospital or clinic and will not be stored at home. NOTE: This sheet is a summary. It may not cover all possible information. If you  have questions about this medicine, talk to your doctor, pharmacist, or health care provider.  2012, [employer]. (02/25/2008 11:52:10 AM)  Carboplatin injection What is this medicine? CARBOPLATIN (KAR boe pla tin) is a chemotherapy drug. It targets fast dividing cells, like cancer cells, and causes these cells to die. This medicine is used to treat ovarian cancer and many other cancers. This medicine may be used for other purposes; ask your health care provider or pharmacist if you have questions. What should I tell my health care provider before I take this medicine? They need to know if you have any of these conditions: -blood disorders -hearing problems -kidney disease -recent or ongoing radiation therapy -an unusual or allergic reaction to carboplatin, cisplatin, other chemotherapy, other medicines, foods, dyes, or preservatives -pregnant or trying to get pregnant -breast-feeding How should I use this medicine? This drug is usually given as an infusion into a vein. It is administered in a hospital or clinic by a specially trained health care professional. Talk to your pediatrician regarding the use of this medicine in children. Special care may be needed. Overdosage: If you think you have taken too much of this medicine contact a poison control center or emergency room at once. NOTE: This medicine is only for you. Do not share this medicine with others. What if I miss a dose? It is important not to miss a dose. Call your doctor or health care professional if you are unable to keep an appointment. What may interact with this medicine? -medicines  for seizures -medicines to increase blood counts like filgrastim, pegfilgrastim, sargramostim -some antibiotics like amikacin, gentamicin, neomycin, streptomycin, tobramycin -vaccines Talk to your doctor or health care professional before taking any of these  medicines: -acetaminophen -aspirin -ibuprofen -ketoprofen -naproxen This list may not describe all possible interactions. Give your health care provider a list of all the medicines, herbs, non-prescription drugs, or dietary supplements you use. Also tell them if you smoke, drink alcohol, or use illegal drugs. Some items may interact with your medicine. What should I watch for while using this medicine? Your condition will be monitored carefully while you are receiving this medicine. You will need important blood work done while you are taking this medicine. This drug may make you feel generally unwell. This is not uncommon, as chemotherapy can affect healthy cells as well as cancer cells. Report any side effects. Continue your course of treatment even though you feel ill unless your doctor tells you to stop. In some cases, you may be given additional medicines to help with side effects. Follow all directions for their use. Call your doctor or health care professional for advice if you get a fever, chills or sore throat, or other symptoms of a cold or flu. Do not treat yourself. This drug decreases your body's ability to fight infections. Try to avoid being around people who are sick. This medicine may increase your risk to bruise or bleed. Call your doctor or health care professional if you notice any unusual bleeding. Be careful brushing and flossing your teeth or using a toothpick because you may get an infection or bleed more easily. If you have any dental work done, tell your dentist you are receiving this medicine. Avoid taking products that contain aspirin, acetaminophen, ibuprofen, naproxen, or ketoprofen unless instructed by your doctor. These medicines may hide a fever. Do not become pregnant while taking this medicine. Women should inform their doctor if they wish to become pregnant or think they might be pregnant. There is a potential for serious side effects to an unborn child. Talk to  your health care professional or pharmacist for more information. Do not breast-feed an infant while taking this medicine. What side effects may I notice from receiving this medicine? Side effects that you should report to your doctor or health care professional as soon as possible: -allergic reactions like skin rash, itching or hives, swelling of the face, lips, or tongue -signs of infection - fever or chills, cough, sore throat, pain or difficulty passing urine -signs of decreased platelets or bleeding - bruising, pinpoint red spots on the skin, black, tarry stools, nosebleeds -signs of decreased red blood cells - unusually weak or tired, fainting spells, lightheadedness -breathing problems -changes in hearing -changes in vision -chest pain -high blood pressure -low blood counts - This drug may decrease the number of white blood cells, red blood cells and platelets. You may be at increased risk for infections and bleeding. -nausea and vomiting -pain, swelling, redness or irritation at the injection site -pain, tingling, numbness in the hands or feet -problems with balance, talking, walking -trouble passing urine or change in the amount of urine Side effects that usually do not require medical attention (report to your doctor or health care professional if they continue or are bothersome): -hair loss -loss of appetite -metallic taste in the mouth or changes in taste This list may not describe all possible side effects. Call your doctor for medical advice about side effects. You may report side effects to FDA at  1-800-FDA-1088. Where should I keep my medicine? This drug is given in a hospital or clinic and will not be stored at home. NOTE: This sheet is a summary. It may not cover all possible information. If you have questions about this medicine, talk to your doctor, pharmacist, or health care provider.  2012, [employer] Standard. (06/19/2007 2:38:05 PM)  Trastuzumab injection for  infusion What is this medicine? TRASTUZUMAB (tras TOO zoo mab) is a monoclonal antibody. It targets a protein called HER2. This protein is found in some stomach and breast cancers. This medicine can stop cancer cell growth. This medicine may be used with other cancer treatments. This medicine may be used for other purposes; ask your health care provider or pharmacist if you have questions. What should I tell my health care provider before I take this medicine? They need to know if you have any of these conditions: -heart disease -heart failure -infection (especially a virus infection such as chickenpox, cold sores, or herpes) -lung or breathing disease, like asthma -recent or ongoing radiation therapy -an unusual or allergic reaction to trastuzumab, benzyl alcohol, or other medications, foods, dyes, or preservatives -pregnant or trying to get pregnant -breast-feeding How should I use this medicine? This drug is given as an infusion into a vein. It is administered in a hospital or clinic by a specially trained health care professional. Talk to your pediatrician regarding the use of this medicine in children. This medicine is not approved for use in children. Overdosage: If you think you have taken too much of this medicine contact a poison control center or emergency room at once. NOTE: This medicine is only for you. Do not share this medicine with others. What if I miss a dose? It is important not to miss a dose. Call your doctor or health care professional if you are unable to keep an appointment. What may interact with this medicine? -cyclophosphamide -doxorubicin -warfarin This list may not describe all possible interactions. Give your health care provider a list of all the medicines, herbs, non-prescription drugs, or dietary supplements you use. Also tell them if you smoke, drink alcohol, or use illegal drugs. Some items may interact with your medicine. What should I watch for while  using this medicine? Visit your doctor for checks on your progress. Report any side effects. Continue your course of treatment even though you feel ill unless your doctor tells you to stop. Call your doctor or health care professional for advice if you get a fever, chills or sore throat, or other symptoms of a cold or flu. Do not treat yourself. Try to avoid being around people who are sick. You may experience fever, chills and shaking during your first infusion. These effects are usually mild and can be treated with other medicines. Report any side effects during the infusion to your health care professional. Fever and chills usually do not happen with later infusions. What side effects may I notice from receiving this medicine? Side effects that you should report to your doctor or other health care professional as soon as possible: -breathing difficulties -chest pain or palpitations -cough -dizziness or fainting -fever or chills, sore throat -skin rash, itching or hives -swelling of the legs or ankles -unusually weak or tired Side effects that usually do not require medical attention (report to your doctor or other health care professional if they continue or are bothersome): -loss of appetite -headache -muscle aches -nausea This list may not describe all possible side effects. Call your doctor for  medical advice about side effects. You may report side effects to FDA at 1-800-FDA-1088. Where should I keep my medicine? This drug is given in a hospital or clinic and will not be stored at home. NOTE: This sheet is a summary. It may not cover all possible information. If you have questions about this medicine, talk to your doctor, pharmacist, or health care provider.  2013, [employer] Standard. (01/16/2009 1:43:15 PM)   Pegfilgrastim injection What is this medicine? PEGFILGRASTIM (peg fil GRA stim) helps the body make more white blood cells. It is used to prevent infection in people  with low amounts of white blood cells following cancer treatment. This medicine may be used for other purposes; ask your health care provider or pharmacist if you have questions. What should I tell my health care provider before I take this medicine? They need to know if you have any of these conditions: -sickle cell disease -an unusual or allergic reaction to pegfilgrastim, filgrastim, E.coli protein, other medicines, foods, dyes, or preservatives -pregnant or trying to get pregnant -breast-feeding How should I use this medicine? This medicine is for injection under the skin. It is usually given by a health care professional in a hospital or clinic setting. If you get this medicine at home, you will be taught how to prepare and give this medicine. Do not shake this medicine. Use exactly as directed. Take your medicine at regular intervals. Do not take your medicine more often than directed. It is important that you put your used needles and syringes in a special sharps container. Do not put them in a trash can. If you do not have a sharps container, call your pharmacist or healthcare provider to get one. Talk to your pediatrician regarding the use of this medicine in children. While this drug may be prescribed for children who weigh more than 45 kg for selected conditions, precautions do apply Overdosage: If you think you have taken too much of this medicine contact a poison control center or emergency room at once. NOTE: This medicine is only for you. Do not share this medicine with others. What if I miss a dose? If you miss a dose, take it as soon as you can. If it is almost time for your next dose, take only that dose. Do not take double or extra doses. What may interact with this medicine? -lithium -medicines for growth therapy This list may not describe all possible interactions. Give your health care provider a list of all the medicines, herbs, non-prescription drugs, or dietary  supplements you use. Also tell them if you smoke, drink alcohol, or use illegal drugs. Some items may interact with your medicine. What should I watch for while using this medicine? Visit your doctor for regular check ups. You will need important blood work done while you are taking this medicine. What side effects may I notice from receiving this medicine? Side effects that you should report to your doctor or health care professional as soon as possible: -allergic reactions like skin rash, itching or hives, swelling of the face, lips, or tongue -breathing problems -fever -pain, redness, or swelling where injected -shoulder pain -stomach or side pain Side effects that usually do not require medical attention (report to your doctor or health care professional if they continue or are bothersome): -aches, pains -headache -loss of appetite -nausea, vomiting -unusually tired This list may not describe all possible side effects. Call your doctor for medical advice about side effects. You may report side effects to  FDA at 1-800-FDA-1088. Where should I keep my medicine? Keep out of the reach of children. Store in a refrigerator between 2 and 8 degrees C (36 and 46 degrees F). Do not freeze. Keep in carton to protect from light. Throw away this medicine if it is left out of the refrigerator for more than 48 hours. Throw away any unused medicine after the expiration date. NOTE: This sheet is a summary. It may not cover all possible information. If you have questions about this medicine, talk to your doctor, pharmacist, or health care provider.  2013, [employer] Standard. (10/15/2007 3:41:44 PM)

## 2012-03-12 ENCOUNTER — Other Ambulatory Visit: Payer: Self-pay | Admitting: *Deleted

## 2012-03-12 ENCOUNTER — Telehealth (INDEPENDENT_AMBULATORY_CARE_PROVIDER_SITE_OTHER): Payer: Self-pay

## 2012-03-12 ENCOUNTER — Other Ambulatory Visit (INDEPENDENT_AMBULATORY_CARE_PROVIDER_SITE_OTHER): Payer: Self-pay | Admitting: Surgery

## 2012-03-12 NOTE — Telephone Encounter (Signed)
Dr. Welton Flakes called office requesting Port-A-Cath Placement for patient.  Dr. Welton Flakes would like patient to have PAC placed before 03/30/12.  Patient will start treatments on 03/30/12.

## 2012-03-14 ENCOUNTER — Ambulatory Visit (INDEPENDENT_AMBULATORY_CARE_PROVIDER_SITE_OTHER): Payer: Medicare Other | Admitting: Surgery

## 2012-03-14 ENCOUNTER — Encounter (INDEPENDENT_AMBULATORY_CARE_PROVIDER_SITE_OTHER): Payer: Self-pay | Admitting: Surgery

## 2012-03-14 VITALS — BP 146/90 | HR 84 | Temp 97.1°F | Resp 18 | Ht 61.5 in | Wt 188.8 lb

## 2012-03-14 DIAGNOSIS — Z09 Encounter for follow-up examination after completed treatment for conditions other than malignant neoplasm: Secondary | ICD-10-CM

## 2012-03-14 NOTE — Patient Instructions (Signed)
I will see you for surgery to put a Port-A-Cath in on December 30. Call me if you have any problems or questions beforehand

## 2012-03-14 NOTE — Progress Notes (Signed)
Chief complaint: Preop visit  History of present illness: This patient is status post lumpectomy and sentinel lymph node for breast cancer and will now need chemotherapy. She needs port.  Exam: Vital signs:BP 146/90  Pulse 84  Temp 97.1 F (36.2 C)  Resp 18  Ht 5' 1.5" (1.562 m)  Wt 188 lb 12.8 oz (85.639 kg)  BMI 35.10 kg/m2 Gen.: Patient awake and healthy-appearing Lungs: Clear to auscultation Heart: Regular rhythm Breasts: Well-healed incisions  Impression: Doing well  Plan: Already scheduled for port December 30. I reviewed risks and complications today. All questions answered.

## 2012-03-15 ENCOUNTER — Other Ambulatory Visit: Payer: Medicare Other

## 2012-03-15 ENCOUNTER — Encounter: Payer: Self-pay | Admitting: *Deleted

## 2012-03-15 ENCOUNTER — Telehealth: Payer: Self-pay | Admitting: *Deleted

## 2012-03-15 NOTE — Telephone Encounter (Signed)
Per scheduler and POF I have scheduled appts. JMW

## 2012-03-19 ENCOUNTER — Encounter: Payer: Self-pay | Admitting: *Deleted

## 2012-03-19 NOTE — Progress Notes (Signed)
Mailed after appt letter to pt. 

## 2012-03-23 ENCOUNTER — Encounter (HOSPITAL_BASED_OUTPATIENT_CLINIC_OR_DEPARTMENT_OTHER): Payer: Self-pay | Admitting: *Deleted

## 2012-03-23 NOTE — Progress Notes (Signed)
Pt has been in [place]-did reach her son to talk with pt- Will be here 03/26/12 6am

## 2012-03-23 NOTE — Progress Notes (Signed)
Per dr crews-does not need an istat-cbg ok

## 2012-03-26 ENCOUNTER — Ambulatory Visit (HOSPITAL_COMMUNITY): Payer: Medicare Other

## 2012-03-26 ENCOUNTER — Encounter (HOSPITAL_BASED_OUTPATIENT_CLINIC_OR_DEPARTMENT_OTHER): Payer: Self-pay | Admitting: Anesthesiology

## 2012-03-26 ENCOUNTER — Encounter (HOSPITAL_BASED_OUTPATIENT_CLINIC_OR_DEPARTMENT_OTHER): Payer: Self-pay

## 2012-03-26 ENCOUNTER — Ambulatory Visit (HOSPITAL_BASED_OUTPATIENT_CLINIC_OR_DEPARTMENT_OTHER): Payer: Medicare Other | Admitting: Anesthesiology

## 2012-03-26 ENCOUNTER — Ambulatory Visit (HOSPITAL_BASED_OUTPATIENT_CLINIC_OR_DEPARTMENT_OTHER)
Admission: RE | Admit: 2012-03-26 | Discharge: 2012-03-26 | Disposition: A | Discharge status: Home / Self Care | Payer: Medicare Other | Source: Ambulatory Visit | Attending: Surgery | Admitting: Surgery

## 2012-03-26 ENCOUNTER — Encounter (HOSPITAL_BASED_OUTPATIENT_CLINIC_OR_DEPARTMENT_OTHER)
Admission: RE | Disposition: A | Discharge status: Home / Self Care | Payer: Self-pay | Source: Ambulatory Visit | Attending: Surgery

## 2012-03-26 DIAGNOSIS — E119 Type 2 diabetes mellitus without complications: Secondary | ICD-10-CM | POA: Insufficient documentation

## 2012-03-26 DIAGNOSIS — C50911 Malignant neoplasm of unspecified site of right female breast: Secondary | ICD-10-CM

## 2012-03-26 DIAGNOSIS — E039 Hypothyroidism, unspecified: Secondary | ICD-10-CM | POA: Insufficient documentation

## 2012-03-26 DIAGNOSIS — C50919 Malignant neoplasm of unspecified site of unspecified female breast: Secondary | ICD-10-CM

## 2012-03-26 DIAGNOSIS — J45909 Unspecified asthma, uncomplicated: Secondary | ICD-10-CM | POA: Insufficient documentation

## 2012-03-26 DIAGNOSIS — I10 Essential (primary) hypertension: Secondary | ICD-10-CM | POA: Insufficient documentation

## 2012-03-26 HISTORY — DX: Other specified postprocedural states: R11.2

## 2012-03-26 HISTORY — PX: PORTACATH PLACEMENT: SHX2246

## 2012-03-26 HISTORY — DX: Other specified postprocedural states: Z98.890

## 2012-03-26 HISTORY — DX: Presence of spectacles and contact lenses: Z97.3

## 2012-03-26 SURGERY — INSERTION, TUNNELED CENTRAL VENOUS DEVICE, WITH PORT
Anesthesia: General | Site: Chest | Laterality: Left | Wound class: Clean

## 2012-03-26 MED ORDER — HEPARIN SOD (PORK) LOCK FLUSH 100 UNIT/ML IV SOLN
INTRAVENOUS | Status: DC | PRN
  Administered 2012-03-26: 500 [IU] via INTRAVENOUS

## 2012-03-26 MED ORDER — LIDOCAINE HCL (CARDIAC) 20 MG/ML IV SOLN
INTRAVENOUS | Status: DC | PRN
  Administered 2012-03-26: 100 mg via INTRAVENOUS

## 2012-03-26 MED ORDER — OXYCODONE HCL 5 MG PO TABS
5.0000 mg | ORAL_TABLET | Freq: Once | ORAL | Status: DC | PRN
Start: 2012-03-26 — End: 2012-03-26

## 2012-03-26 MED ORDER — LACTATED RINGERS IV SOLN
INTRAVENOUS | Status: DC
  Administered 2012-03-26: 07:00:00 via INTRAVENOUS

## 2012-03-26 MED ORDER — HYDROMORPHONE HCL PF 1 MG/ML IJ SOLN: 0.2500 mg | INTRAMUSCULAR | Status: DC | PRN

## 2012-03-26 MED ORDER — CHLORHEXIDINE GLUCONATE 4 % EX LIQD: 1.0000 "application " | Freq: Once | CUTANEOUS | Status: DC

## 2012-03-26 MED ORDER — MEPERIDINE HCL 25 MG/ML IJ SOLN: 6.2500 mg | INTRAMUSCULAR | Status: DC | PRN

## 2012-03-26 MED ORDER — DEXAMETHASONE SODIUM PHOSPHATE 4 MG/ML IJ SOLN
INTRAMUSCULAR | Status: DC | PRN
  Administered 2012-03-26: 4 mg via INTRAVENOUS

## 2012-03-26 MED ORDER — OXYCODONE HCL 5 MG/5ML PO SOLN: 5.0000 mg | Freq: Once | ORAL | Status: DC | PRN

## 2012-03-26 MED ORDER — PROMETHAZINE HCL 25 MG/ML IJ SOLN: 6.2500 mg | INTRAMUSCULAR | Status: DC | PRN

## 2012-03-26 MED ORDER — FENTANYL CITRATE 0.05 MG/ML IJ SOLN
INTRAMUSCULAR | Status: DC | PRN
  Administered 2012-03-26: 25 ug via INTRAVENOUS
  Administered 2012-03-26: 50 ug via INTRAVENOUS
  Administered 2012-03-26: 25 ug via INTRAVENOUS

## 2012-03-26 MED ORDER — PROPOFOL 10 MG/ML IV BOLUS
INTRAVENOUS | Status: DC | PRN
  Administered 2012-03-26: 170 mg via INTRAVENOUS

## 2012-03-26 MED ORDER — BUPIVACAINE HCL (PF) 0.25 % IJ SOLN
INTRAMUSCULAR | Status: DC | PRN
  Administered 2012-03-26: 10 mL

## 2012-03-26 MED ORDER — CEFAZOLIN SODIUM-DEXTROSE 2-3 GM-% IV SOLR
2.0000 g | INTRAVENOUS | Status: AC
  Administered 2012-03-26: 2 g via INTRAVENOUS

## 2012-03-26 MED ORDER — EPHEDRINE SULFATE 50 MG/ML IJ SOLN
INTRAMUSCULAR | Status: DC | PRN
  Administered 2012-03-26 (×2): 5 mg via INTRAVENOUS

## 2012-03-26 MED ORDER — HEPARIN (PORCINE) IN NACL 2-0.9 UNIT/ML-% IJ SOLN
INTRAMUSCULAR | Status: DC | PRN
  Administered 2012-03-26: 1 via INTRAVENOUS

## 2012-03-26 MED ORDER — ONDANSETRON HCL 4 MG/2ML IJ SOLN
INTRAMUSCULAR | Status: DC | PRN
  Administered 2012-03-26: 4 mg via INTRAVENOUS

## 2012-03-26 SURGICAL SUPPLY — 46 items
BAG DECANTER FOR FLEXI CONT (MISCELLANEOUS) ×2 IMPLANT
BENZOIN TINCTURE PRP APPL 2/3 (GAUZE/BANDAGES/DRESSINGS) IMPLANT
BLADE HEX COATED 2.75 (ELECTRODE) ×2 IMPLANT
BLADE SURG 15 STRL LF DISP TIS (BLADE) ×1 IMPLANT
BLADE SURG 15 STRL SS (BLADE) ×1
CANISTER SUCTION 1200CC (MISCELLANEOUS) IMPLANT
CHLORAPREP W/TINT 26ML (MISCELLANEOUS) ×2 IMPLANT
CLOTH BEACON ORANGE TIMEOUT ST (SAFETY) ×2 IMPLANT
COVER MAYO STAND STRL (DRAPES) ×2 IMPLANT
COVER TABLE BACK 60X90 (DRAPES) ×2 IMPLANT
DECANTER SPIKE VIAL GLASS SM (MISCELLANEOUS) ×2 IMPLANT
DERMABOND ADVANCED (GAUZE/BANDAGES/DRESSINGS)
DERMABOND ADVANCED .7 DNX12 (GAUZE/BANDAGES/DRESSINGS) IMPLANT
DRAPE C-ARM 42X72 X-RAY (DRAPES) ×2 IMPLANT
DRAPE LAPAROTOMY TRNSV 102X78 (DRAPE) ×2 IMPLANT
DRAPE UTILITY XL STRL (DRAPES) ×2 IMPLANT
ELECT REM PT RETURN 9FT ADLT (ELECTROSURGICAL) ×2
ELECTRODE REM PT RTRN 9FT ADLT (ELECTROSURGICAL) ×1 IMPLANT
GLOVE EUDERMIC 7 POWDERFREE (GLOVE) ×2 IMPLANT
GOWN PREVENTION PLUS XLARGE (GOWN DISPOSABLE) ×4 IMPLANT
IV CATH PLACEMENT UNIT 16 GA (IV SOLUTION) IMPLANT
IV HEPARIN 1000UNITS/500ML (IV SOLUTION) ×2 IMPLANT
IV KIT MINILOC 20X1 SAFETY (NEEDLE) IMPLANT
KIT BARDPORT ISP (Port) IMPLANT
KIT PORT POWER 8FR ISP CVUE (Catheter) ×2 IMPLANT
KIT PORT POWER ISP 8FR (Catheter) IMPLANT
KIT POWER PORT SLIM 6FR (PORTABLE EQUIPMENT SUPPLIES) IMPLANT
NEEDLE HYPO 25X1 1.5 SAFETY (NEEDLE) ×2 IMPLANT
PACK BASIN DAY SURGERY FS (CUSTOM PROCEDURE TRAY) ×2 IMPLANT
PENCIL BUTTON HOLSTER BLD 10FT (ELECTRODE) ×2 IMPLANT
SET SHEATH INTRODUCER 10FR (MISCELLANEOUS) IMPLANT
SHEATH COOK PEEL AWAY SET 9F (SHEATH) IMPLANT
SHEET MEDIUM DRAPE 40X70 STRL (DRAPES) IMPLANT
SLEEVE SCD COMPRESS KNEE MED (MISCELLANEOUS) ×2 IMPLANT
STAPLER VISISTAT (STAPLE) IMPLANT
STRIP CLOSURE SKIN 1/2X4 (GAUZE/BANDAGES/DRESSINGS) IMPLANT
SUT MNCRL AB 4-0 PS2 18 (SUTURE) ×2 IMPLANT
SUT PROLENE 2 0 SH DA (SUTURE) ×2 IMPLANT
SUT VICRYL 3-0 CR8 SH (SUTURE) ×2 IMPLANT
SYR 5ML LUER SLIP (SYRINGE) ×2 IMPLANT
SYR CONTROL 10ML LL (SYRINGE) ×2 IMPLANT
TOWEL OR 17X24 6PK STRL BLUE (TOWEL DISPOSABLE) ×4 IMPLANT
TOWEL OR NON WOVEN STRL DISP B (DISPOSABLE) ×2 IMPLANT
TUBE CONNECTING 20X1/4 (TUBING) IMPLANT
WATER STERILE IRR 1000ML POUR (IV SOLUTION) IMPLANT
YANKAUER SUCT BULB TIP NO VENT (SUCTIONS) IMPLANT

## 2012-03-26 NOTE — H&P (View-Only) (Signed)
Chief complaint: Preop visit  History of present illness: This patient is status post lumpectomy and sentinel lymph node for breast cancer and will now need chemotherapy. She needs port.  Exam: Vital signs:BP 146/90  Pulse 84  Temp 97.1 F (36.2 C)  Resp 18  Ht 5' 1.5" (1.562 m)  Wt 188 lb 12.8 oz (85.639 kg)  BMI 35.10 kg/m2 Gen.: Patient awake and healthy-appearing Lungs: Clear to auscultation Heart: Regular rhythm Breasts: Well-healed incisions  Impression: Doing well  Plan: Already scheduled for port December 30. I reviewed risks and complications today. All questions answered. 

## 2012-03-26 NOTE — Anesthesia Postprocedure Evaluation (Signed)
  Anesthesia Post-op Note  Patient: Heather Vega  Procedure(s) Performed: Procedure(s) (LRB) with comments: INSERTION PORT-A-CATH (Left)  Patient Location: PACU  Anesthesia Type:General  Level of Consciousness: awake  Airway and Oxygen Therapy: Patient Spontanous Breathing  Post-op Pain: mild  Post-op Assessment: Post-op Vital signs reviewed  Post-op Vital Signs: stable  Complications: No apparent anesthesia complications

## 2012-03-26 NOTE — Interval H&P Note (Signed)
History and Physical Interval Note:  03/26/2012 7:26 AM  Heather Vega  has presented today for surgery, with the diagnosis of breast cancer   The various methods of treatment have been discussed with the patient and family. After consideration of risks, benefits and other options for treatment, the patient has consented to  Procedure(s) (LRB) with comments: INSERTION PORT-A-CATH (N/A) as a surgical intervention .  The patient's history has been reviewed, patient examined, no change in status, stable for surgery.  I have reviewed the patient's chart and labs.  Questions were answered to the patient's satisfaction.     [name]

## 2012-03-26 NOTE — Transfer of Care (Signed)
Immediate Anesthesia Transfer of Care Note  Patient: Heather Vega  Procedure(s) Performed: Procedure(s) (LRB): INSERTION PORT-A-CATH (Left)  Patient Location: PACU  Anesthesia Type: General  Level of Consciousness: awake, oriented, sedated and patient cooperative  Airway & Oxygen Therapy: Patient Spontanous Breathing and Patient connected to face mask oxygen  Post-op Assessment: Report given to PACU RN and Post -op Vital signs reviewed and stable  Post vital signs: Reviewed and stable  Complications: No apparent anesthesia complications

## 2012-03-26 NOTE — Anesthesia Preprocedure Evaluation (Signed)
Anesthesia Evaluation  Patient identified by MRN, date of birth, ID band  History of Anesthesia Complications (+) PONV  Airway Mallampati: I      Dental  (+) Teeth Intact   Pulmonary asthma ,  breath sounds clear to auscultation        Cardiovascular hypertension, Rhythm:Regular Rate:Normal     Neuro/Psych    GI/Hepatic negative GI ROS, Neg liver ROS,   Endo/Other  diabetesHypothyroidism   Renal/GU negative Renal ROS     Musculoskeletal   Abdominal   Peds  Hematology negative hematology ROS (+)   Anesthesia Other Findings   Reproductive/Obstetrics                           Anesthesia Physical Anesthesia Plan  ASA: II  Anesthesia Plan: General   Post-op Pain Management:    Induction: Intravenous  Airway Management Planned: LMA  Additional Equipment:   Intra-op Plan:   Post-operative Plan: Extubation in OR  Informed Consent:   Dental advisory given  Plan Discussed with: CRNA and Surgeon  Anesthesia Plan Comments:         Anesthesia Quick Evaluation

## 2012-03-26 NOTE — Anesthesia Procedure Notes (Signed)
Procedure Name: LMA Insertion Date/Time: 03/26/2012 7:42 AM Performed by: Renella Cunas D Pre-anesthesia Checklist: Patient identified, Emergency Drugs available, Suction available and Patient being monitored Patient Re-evaluated:Patient Re-evaluated prior to inductionOxygen Delivery Method: Circle System Utilized Preoxygenation: Pre-oxygenation with 100% oxygen Intubation Type: IV induction Ventilation: Mask ventilation without difficulty LMA: LMA inserted LMA Size: 4.0 Number of attempts: 1 Airway Equipment and Method: bite block Placement Confirmation: positive ETCO2 Tube secured with: Tape Dental Injury: Teeth and Oropharynx as per pre-operative assessment

## 2012-03-26 NOTE — Op Note (Signed)
Heather Vega Aug 15, 1935 [id number] 03/13/2012  Preoperative diagnosis: PAC needed  Postoperative diagnosis: Same  Procedure: Portacath Placement  Surgeon: Currie Paris, MD, FACS  Anesthesia: General  Clinical History and Indications: The patient is getting ready to begin chemotherapy for her breast cancer. She  needs a Port-A-Cath for venous access.  Description of Procedure: I have seen the patient in the holding area and confirmed the plans for the procedure as noted above. I reviewed the risks and complications again and the patient has no further questions. She wishes to proceed.   The patient was then taken to the operating room. After satisfactory general (LMA) anesthesia had been obtained the upper chest and lower neck were prepped and draped as a sterile field. The timeout was done.  The left subclavian vein was entered and the guidewire threaded into the superior vena cava right atrial area under fluoroscopic guidance. An incision was then made on the anterior chest wall and a subcutaneous pocket fashioned for the port reservoir.  The port tubing was then brought through a subcutaneous tunnel from the port site to the guidewire site. The dilator and peel-away sheath were then advanced over the guidewire while monitoring this with fluoroscopy. The guidewire and dilator were removed and the tubing threaded to approximately 22 cm. The peel-away sheath was then removed. The catheter aspirated and flushed easily. Using fluoroscopy the tip was backed out into the superior vena cava right atrial junction area, 19 cm from the entry site. It aspirated and flushed easily. The reservoir was attached and the locking mechanism engaged. That aspirated and flushed easily.  The reservoir was secured to the fascia with 2 sutures of 2-0 Prolene. A final check with fluoroscopy was done to make sure we had no kinks and good positioning of the tip of the catheter. Everything appeared to be  okay. The catheter was aspirated, flushed with dilute heparin and then concentrated aqueous heparin.  The incision was then closed with interrupted 3-0 Vicryl, and 4-0 Monocryl subcuticular with Dermabond on the skin.  There were no operative complications. Estimated blood loss was minimal. All counts were correct. The patient tolerated the procedure well.  Currie Paris, MD, FACS 03/26/2012 8:21 AM

## 2012-03-27 ENCOUNTER — Encounter (HOSPITAL_BASED_OUTPATIENT_CLINIC_OR_DEPARTMENT_OTHER): Payer: Self-pay | Admitting: Surgery

## 2012-03-27 ENCOUNTER — Telehealth: Payer: Self-pay | Admitting: *Deleted

## 2012-03-27 ENCOUNTER — Other Ambulatory Visit: Payer: Self-pay | Admitting: *Deleted

## 2012-03-27 NOTE — Telephone Encounter (Signed)
Patient confirmed over the phone the new date and time of the echo at [facility]

## 2012-03-29 ENCOUNTER — Ambulatory Visit (HOSPITAL_COMMUNITY)
Admission: RE | Admit: 2012-03-29 | Discharge: 2012-03-29 | Disposition: A | Discharge status: Home / Self Care | Payer: Medicare Other | Source: Ambulatory Visit | Attending: Family Medicine | Admitting: Family Medicine

## 2012-03-29 ENCOUNTER — Telehealth (INDEPENDENT_AMBULATORY_CARE_PROVIDER_SITE_OTHER): Payer: Self-pay

## 2012-03-29 DIAGNOSIS — C50919 Malignant neoplasm of unspecified site of unspecified female breast: Secondary | ICD-10-CM | POA: Insufficient documentation

## 2012-03-29 DIAGNOSIS — Z09 Encounter for follow-up examination after completed treatment for conditions other than malignant neoplasm: Secondary | ICD-10-CM

## 2012-03-29 DIAGNOSIS — C50911 Malignant neoplasm of unspecified site of right female breast: Secondary | ICD-10-CM

## 2012-03-29 NOTE — Progress Notes (Signed)
  Echocardiogram 2D Echocardiogram has been performed.  Ellender Hose A 03/29/2012, 11:29 AM

## 2012-03-29 NOTE — Telephone Encounter (Signed)
Pt home doing well. Po appt made. 

## 2012-03-30 ENCOUNTER — Ambulatory Visit (HOSPITAL_BASED_OUTPATIENT_CLINIC_OR_DEPARTMENT_OTHER): Payer: Medicare Other

## 2012-03-30 ENCOUNTER — Encounter: Payer: Self-pay | Admitting: Oncology

## 2012-03-30 ENCOUNTER — Ambulatory Visit (HOSPITAL_BASED_OUTPATIENT_CLINIC_OR_DEPARTMENT_OTHER): Payer: Medicare Other | Admitting: Oncology

## 2012-03-30 ENCOUNTER — Other Ambulatory Visit (HOSPITAL_BASED_OUTPATIENT_CLINIC_OR_DEPARTMENT_OTHER): Payer: Medicare Other | Admitting: Lab

## 2012-03-30 ENCOUNTER — Telehealth: Payer: Self-pay | Admitting: Oncology

## 2012-03-30 VITALS — BP 182/75 | HR 94 | Temp 97.5°F | Resp 20 | Ht 61.5 in | Wt 188.5 lb

## 2012-03-30 VITALS — BP 145/74 | HR 80 | Temp 97.0°F | Resp 18

## 2012-03-30 DIAGNOSIS — C50419 Malignant neoplasm of upper-outer quadrant of unspecified female breast: Secondary | ICD-10-CM

## 2012-03-30 DIAGNOSIS — Z5111 Encounter for antineoplastic chemotherapy: Secondary | ICD-10-CM

## 2012-03-30 DIAGNOSIS — C773 Secondary and unspecified malignant neoplasm of axilla and upper limb lymph nodes: Secondary | ICD-10-CM

## 2012-03-30 DIAGNOSIS — C50911 Malignant neoplasm of unspecified site of right female breast: Secondary | ICD-10-CM

## 2012-03-30 DIAGNOSIS — Z5112 Encounter for antineoplastic immunotherapy: Secondary | ICD-10-CM

## 2012-03-30 DIAGNOSIS — Z170 Estrogen receptor positive status [ER+]: Secondary | ICD-10-CM

## 2012-03-30 LAB — COMPREHENSIVE METABOLIC PANEL (CC13)
CO2: 24 mEq/L (ref 22–29)
Creatinine: 0.9 mg/dL (ref 0.6–1.1)
Glucose: 155 mg/dl — ABNORMAL HIGH (ref 70–99)
Total Bilirubin: 0.46 mg/dL (ref 0.20–1.20)

## 2012-03-30 LAB — CBC WITH DIFFERENTIAL/PLATELET
Eosinophils Absolute: 0 10*3/uL (ref 0.0–0.5)
HCT: 41.2 % (ref 34.8–46.6)
LYMPH%: 12.4 % — ABNORMAL LOW (ref 14.0–49.7)
MCHC: 34.2 g/dL (ref 31.5–36.0)
MONO#: 0.8 10*3/uL (ref 0.1–0.9)
NEUT#: 9.2 10*3/uL — ABNORMAL HIGH (ref 1.5–6.5)
NEUT%: 80.5 % — ABNORMAL HIGH (ref 38.4–76.8)
Platelets: 261 10*3/uL (ref 145–400)
WBC: 11.4 10*3/uL — ABNORMAL HIGH (ref 3.9–10.3)

## 2012-03-30 MED ORDER — DOCETAXEL CHEMO INJECTION 160 MG/16ML
75.0000 mg/m2 | Freq: Once | INTRAVENOUS | Status: AC
  Administered 2012-03-30: 150 mg via INTRAVENOUS
  Filled 2012-03-30: qty 15

## 2012-03-30 MED ORDER — SODIUM CHLORIDE 0.9 % IV SOLN
Freq: Once | INTRAVENOUS | Status: AC
  Administered 2012-03-30: 20 mL via INTRAVENOUS

## 2012-03-30 MED ORDER — DEXAMETHASONE SODIUM PHOSPHATE 10 MG/ML IJ SOLN
20.0000 mg | Freq: Once | INTRAMUSCULAR | Status: AC
  Administered 2012-03-30: 20 mg via INTRAVENOUS

## 2012-03-30 MED ORDER — ONDANSETRON 16 MG/50ML IVPB (CHCC)
16.0000 mg | Freq: Once | INTRAVENOUS | Status: AC
Start: 2012-03-30 — End: 2012-03-30
  Administered 2012-03-30: 16 mg via INTRAVENOUS

## 2012-03-30 MED ORDER — SODIUM CHLORIDE 0.9 % IJ SOLN
10.0000 mL | INTRAMUSCULAR | Status: DC | PRN
  Administered 2012-03-30: 10 mL
  Filled 2012-03-30: qty 10

## 2012-03-30 MED ORDER — ACETAMINOPHEN 325 MG PO TABS
650.0000 mg | ORAL_TABLET | Freq: Once | ORAL | Status: AC
  Administered 2012-03-30: 650 mg via ORAL

## 2012-03-30 MED ORDER — CARBOPLATIN CHEMO INJECTION 600 MG/60ML
652.8000 mg | Freq: Once | INTRAVENOUS | Status: AC
  Administered 2012-03-30: 650 mg via INTRAVENOUS
  Filled 2012-03-30: qty 65

## 2012-03-30 MED ORDER — DIPHENHYDRAMINE HCL 25 MG PO CAPS
50.0000 mg | ORAL_CAPSULE | Freq: Once | ORAL | Status: AC
  Administered 2012-03-30: 50 mg via ORAL

## 2012-03-30 MED ORDER — HEPARIN SOD (PORK) LOCK FLUSH 100 UNIT/ML IV SOLN
500.0000 [IU] | Freq: Once | INTRAVENOUS | Status: AC | PRN
  Administered 2012-03-30: 500 [IU]
  Filled 2012-03-30: qty 5

## 2012-03-30 MED ORDER — TRASTUZUMAB CHEMO INJECTION 440 MG
4.0000 mg/kg | Freq: Once | INTRAVENOUS | Status: AC
  Administered 2012-03-30: 336 mg via INTRAVENOUS
  Filled 2012-03-30: qty 16

## 2012-03-30 NOTE — Progress Notes (Deleted)
OFFICE PROGRESS NOTE  CCLeo Grosser, MD [address] [place] [address]  DIAGNOSIS: ***  PRIOR THERAPY:***  CURRENT THERAPY:***  INTERVAL HISTORY: Heather Vega 77 y.o. female returns for***  MEDICAL HISTORY: Past Medical History  Diagnosis Date  . Hyperlipidemia     doesn't require meds  . Asthma     pt states its "not bad"  . Back pain     buldging disc and pt says 2 are "flat"  . Hemorrhoids   . History of colon polyps   . Diverticulosis   . Cancer     breast right  . Hard of hearing   . Diabetes mellitus without complication     takes Metformin daily,Type II  . Allergy   . Cataract     b/l surgery  . Hypothyroidism     hx of;but has been off of meds >103yrs ago  . Wears glasses   . Hypertension     history, was on b/p  meds years ago  . PONV (postoperative nausea and vomiting)     ALLERGIES:   has no known allergies.  MEDICATIONS:  Current Outpatient Prescriptions  Medication Sig Dispense Refill  . aspirin 81 MG tablet Take 81 mg by mouth daily.      Marland Kitchen CINNAMON PO Take 1 tablet by mouth 2 (two) times daily.       Marland Kitchen dexamethasone (DECADRON) 4 MG tablet Take 2 tablets (8 mg total) by mouth 2 (two) times daily with a meal. Take two times a day the day before Taxotere. Then take two times a day starting the day after chemo for 3 days.  30 tablet  1  . lidocaine-prilocaine (EMLA) cream Apply topically as needed.  30 g  7  . LORazepam (ATIVAN) 0.5 MG tablet Take 1 tablet (0.5 mg total) by mouth every 6 (six) hours as needed (Nausea or vomiting).  30 tablet  0  . metFORMIN (GLUCOPHAGE) 1000 MG tablet Take 1,000 mg by mouth 2 (two) times daily with a meal.      . Multiple Vitamins-Minerals (MULTIVITAMIN WITH MINERALS) tablet Take 1 tablet by mouth daily.      . naproxen sodium (ANAPROX) 220 MG tablet Take 220 mg by mouth as needed.      . Nutritional Supplements (FIBER FORMULA) POWD Take 30 mLs by mouth daily. tkes 2-3 tablespoons daily for  constipation      . ondansetron (ZOFRAN) 8 MG tablet Take 1 tablet (8 mg total) by mouth 2 (two) times daily. Take two times a day starting the day after chemo for 3 days. Then take two times a day as needed for nausea or vomiting.  30 tablet  1  . prochlorperazine (COMPAZINE) 10 MG tablet Take 1 tablet (10 mg total) by mouth every 6 (six) hours as needed (Nausea or vomiting).  30 tablet  1  . prochlorperazine (COMPAZINE) 25 MG suppository Place 1 suppository (25 mg total) rectally every 12 (twelve) hours as needed for nausea.  12 suppository  3  . Vitamins C E (CRANBERRY CONCENTRATE PO) Take 1 tablet by mouth 2 (two) times daily.         SURGICAL HISTORY:  Past Surgical History  Procedure Date  . Nose surgery 1953  . Tonsillectomy 1955  . Nasal fracture surgery     as a child x 2  . Tonsillectomy   . Breast surgery 70's    right  . Appendectomy   . Bilateral cataract surgery   .  Colonoscopy   . Neuroplasty / transposition median nerve at carpal tunnel bilateral   . Cardiac catheterization 20+yrs ago  . Breast lumpectomy with needle localization and axillary sentinel lymph node bx 02/07/2012    Procedure: BREAST LUMPECTOMY WITH NEEDLE LOCALIZATION AND AXILLARY SENTINEL LYMPH NODE BX;  Surgeon: Currie Paris, MD;  Location: [facility] OR;  Service: General;  Laterality: Right;  Right needle loc lumpectomy and Senitel Lymph Node Biopsy   . Abdominal hysterectomy     ovaries intact  . Portacath placement 03/26/2012    Procedure: INSERTION PORT-A-CATH;  Surgeon: Currie Paris, MD;  Location: [facility];  Service: General;  Laterality: Left;    REVIEW OF SYSTEMS:  {Ros - complete:30496}   HEALTH MAINTENANCE:  PHYSICAL EXAMINATION: Blood pressure 182/75, pulse 94, temperature 97.5 F (36.4 C), resp. rate 20, height 5' 1.5" (1.562 m), weight 188 lb 8 oz (85.503 kg). Body mass index is 35.04 kg/(m^2). ECOG PERFORMANCE STATUS: {CHL ONC ECOG [id number]   {physical  exam:21449}   LABORATORY DATA: Lab Results  Component Value Date   WBC 11.4* 03/30/2012   HGB 14.1 03/30/2012   HCT 41.2 03/30/2012   MCV 87.3 03/30/2012   PLT 261 03/30/2012      Chemistry      Component Value Date/Time   NA 140 02/21/2012 1052   NA 138 01/31/2012 1030   K 4.4 02/21/2012 1052   K 4.9 01/31/2012 1030   CL 102 02/21/2012 1052   CL 98 01/31/2012 1030   CO2 29 02/21/2012 1052   CO2 28 01/31/2012 1030   BUN 21.0 02/21/2012 1052   BUN 18 01/31/2012 1030   CREATININE 0.8 02/21/2012 1052   CREATININE 0.78 01/31/2012 1030      Component Value Date/Time   CALCIUM 10.0 02/21/2012 1052   CALCIUM 9.8 01/31/2012 1030   ALKPHOS 87 02/21/2012 1052   ALKPHOS 75 01/31/2012 1030   AST 20 02/21/2012 1052   AST 32 01/31/2012 1030   ALT 26 02/21/2012 1052   ALT 23 01/31/2012 1030   BILITOT 0.45 02/21/2012 1052   BILITOT 0.4 01/31/2012 1030       RADIOGRAPHIC STUDIES:  No results found.  ASSESSMENT: ***   PLAN: ***   All questions were answered. The patient knows to call the clinic with any problems, questions or concerns. We can certainly see the patient much sooner if necessary.  I spent {CHL ONC TIME VISIT - ZOXWR:[id number]} counseling the patient face to face. The total time spent in the appointment was {CHL ONC TIME VISIT - BJYNW:[id number]}.    Drue Second, MD Medical/Oncology [facility] [phone] (beeper) [phone] (Office)  03/30/2012, 9:52 AM

## 2012-03-30 NOTE — Patient Instructions (Addendum)
Proceed with chemotherapy.

## 2012-03-30 NOTE — Telephone Encounter (Signed)
Per 1/3 pof f/u as scheduled. Pt given appt schedule for January thru March.

## 2012-03-31 ENCOUNTER — Ambulatory Visit (HOSPITAL_BASED_OUTPATIENT_CLINIC_OR_DEPARTMENT_OTHER): Payer: Medicare Other

## 2012-03-31 VITALS — BP 150/65 | HR 79 | Temp 98.2°F | Resp 18

## 2012-03-31 DIAGNOSIS — C50911 Malignant neoplasm of unspecified site of right female breast: Secondary | ICD-10-CM

## 2012-03-31 DIAGNOSIS — C50419 Malignant neoplasm of upper-outer quadrant of unspecified female breast: Secondary | ICD-10-CM

## 2012-03-31 DIAGNOSIS — C773 Secondary and unspecified malignant neoplasm of axilla and upper limb lymph nodes: Secondary | ICD-10-CM

## 2012-03-31 MED ORDER — PEGFILGRASTIM INJECTION 6 MG/0.6ML
6.0000 mg | Freq: Once | SUBCUTANEOUS | Status: AC
  Administered 2012-03-31: 6 mg via SUBCUTANEOUS

## 2012-04-04 ENCOUNTER — Telehealth: Payer: Self-pay | Admitting: *Deleted

## 2012-04-04 NOTE — Telephone Encounter (Signed)
Pt called states " my heart has been beating really fast, 107/104/106, and I havent had to take any of the medicine for nausea, I did have a hot flash last night. " pt denied chest pain, shortness of breath,fever ( temp- reported to be 96.7). Reviewed with NP, Instructed pt to push fluids, and if she has any chest pain fever or shortness of breath to proceed to nearest urgent care or ED. Pt verbalized understanding states " I noticed yesterday felt this way and after I had drank a lot of water I did feel better" Again discussed NP's instructions, pt verbalized understanding no further questions.

## 2012-04-06 ENCOUNTER — Other Ambulatory Visit (HOSPITAL_BASED_OUTPATIENT_CLINIC_OR_DEPARTMENT_OTHER): Payer: Medicare Other | Admitting: Lab

## 2012-04-06 ENCOUNTER — Ambulatory Visit (HOSPITAL_BASED_OUTPATIENT_CLINIC_OR_DEPARTMENT_OTHER): Payer: Medicare Other | Admitting: Adult Health

## 2012-04-06 ENCOUNTER — Ambulatory Visit (HOSPITAL_BASED_OUTPATIENT_CLINIC_OR_DEPARTMENT_OTHER): Payer: Medicare Other

## 2012-04-06 ENCOUNTER — Encounter: Payer: Self-pay | Admitting: Adult Health

## 2012-04-06 VITALS — BP 149/80 | HR 97 | Temp 98.0°F | Resp 20 | Ht 61.5 in | Wt 181.6 lb

## 2012-04-06 DIAGNOSIS — C50419 Malignant neoplasm of upper-outer quadrant of unspecified female breast: Secondary | ICD-10-CM

## 2012-04-06 DIAGNOSIS — C50911 Malignant neoplasm of unspecified site of right female breast: Secondary | ICD-10-CM

## 2012-04-06 DIAGNOSIS — E860 Dehydration: Secondary | ICD-10-CM

## 2012-04-06 DIAGNOSIS — Z5112 Encounter for antineoplastic immunotherapy: Secondary | ICD-10-CM

## 2012-04-06 DIAGNOSIS — Z170 Estrogen receptor positive status [ER+]: Secondary | ICD-10-CM

## 2012-04-06 LAB — COMPREHENSIVE METABOLIC PANEL (CC13)
Albumin: 3.7 g/dL (ref 3.5–5.0)
CO2: 25 mEq/L (ref 22–29)
Calcium: 9.3 mg/dL (ref 8.4–10.4)
Glucose: 105 mg/dl — ABNORMAL HIGH (ref 70–99)
Potassium: 4.2 mEq/L (ref 3.5–5.1)
Sodium: 138 mEq/L (ref 136–145)
Total Protein: 6.7 g/dL (ref 6.4–8.3)

## 2012-04-06 LAB — CBC WITH DIFFERENTIAL/PLATELET
Basophils Absolute: 0 10*3/uL (ref 0.0–0.1)
Eosinophils Absolute: 0.1 10*3/uL (ref 0.0–0.5)
HGB: 14.4 g/dL (ref 11.6–15.9)
MCV: 87.9 fL (ref 79.5–101.0)
NEUT#: 7.1 10*3/uL — ABNORMAL HIGH (ref 1.5–6.5)
RDW: 13.2 % (ref 11.2–14.5)
lymph#: 2.9 10*3/uL (ref 0.9–3.3)

## 2012-04-06 MED ORDER — HEPARIN SOD (PORK) LOCK FLUSH 100 UNIT/ML IV SOLN
500.0000 [IU] | Freq: Once | INTRAVENOUS | Status: AC | PRN
  Administered 2012-04-06: 500 [IU]
  Filled 2012-04-06: qty 5

## 2012-04-06 MED ORDER — DIPHENHYDRAMINE HCL 25 MG PO CAPS
50.0000 mg | ORAL_CAPSULE | Freq: Once | ORAL | Status: AC
  Administered 2012-04-06: 50 mg via ORAL

## 2012-04-06 MED ORDER — ACETAMINOPHEN 325 MG PO TABS
650.0000 mg | ORAL_TABLET | Freq: Once | ORAL | Status: AC
  Administered 2012-04-06: 650 mg via ORAL

## 2012-04-06 MED ORDER — SODIUM CHLORIDE 0.9 % IV SOLN
Freq: Once | INTRAVENOUS | Status: AC
  Administered 2012-04-06: 13:00:00 via INTRAVENOUS

## 2012-04-06 MED ORDER — SODIUM CHLORIDE 0.9 % IJ SOLN
10.0000 mL | INTRAMUSCULAR | Status: DC | PRN
  Administered 2012-04-06: 10 mL
  Filled 2012-04-06: qty 10

## 2012-04-06 MED ORDER — TRASTUZUMAB CHEMO INJECTION 440 MG
2.0000 mg/kg | Freq: Once | INTRAVENOUS | Status: AC
  Administered 2012-04-06: 168 mg via INTRAVENOUS
  Filled 2012-04-06: qty 8

## 2012-04-06 NOTE — Progress Notes (Signed)
OFFICE PROGRESS NOTE  CC  [facility], MD [address] [place] [address] Dr. Cyndia Bent Dr. Chipper Herb  DIAGNOSIS: 77 year old female with new diagnosis of invasive ductal carcinoma that is ER positive PR positive HER-2/neu positive by [facility]  PRIOR THERAPY:  #1 patient was originally seenBy me on 02/21/2012 when she was diagnosed with invasive ductal carcinoma of the right breast stage II a with a positive lymph node. She underwent a lumpectomy and sentinel lymph node biopsy.The final pathology revealed a 1.8 cm invasive ductal carcinoma with low grade ductal carcinoma in situ the tumor was grade 2 ER positive PR positive Ki-67 was 11%. HER-2/neu was equivocal by cish.  #2 she had a sentinel lymph node biopsy one node was positive for metastatic disease. Making her a stage II A.  #3 patient and I and her family had an extensive discussion at the original meeting and we sent her tumor for HER-2/neu testing by [facility]. By [facility]  HER-2/neu is positive.  CURRENT THERAPY: TCH cycle 1 Day 8 (weekly Herceptin)  INTERVAL HISTORY: Zayah Keilman Bi 77 y.o. female returns for follow up after her first dose of TCH started last week with Neulasta support.  She endorses knee pain, and has had a slightly fast heart rate which seems to get better when she drinks increased amounts of fluids.  She's also c/o having a cotton ball feeling in her ears which she's sure is ear wax.  Otherwise, she denies fevers, chills, numbness, tingling, or any other concerns.    MEDICAL HISTORY: Past Medical History  Diagnosis Date  . Hyperlipidemia     doesn't require meds  . Asthma     pt states its "not bad"  . Back pain     buldging disc and pt says 2 are "flat"  . Hemorrhoids   . History of colon polyps   . Diverticulosis   . Cancer     breast right  . Hard of hearing   . Diabetes mellitus without complication     takes Metformin daily,Type II  . Allergy   . Cataract       b/l surgery  . Hypothyroidism     hx of;but has been off of meds >72yrs ago  . Wears glasses   . Hypertension     history, was on b/p  meds years ago  . PONV (postoperative nausea and vomiting)     ALLERGIES:   has no known allergies.  MEDICATIONS:  Current Outpatient Prescriptions  Medication Sig Dispense Refill  . aspirin 81 MG tablet Take 81 mg by mouth daily.      Marland Kitchen CINNAMON PO Take 1 tablet by mouth 2 (two) times daily.       Marland Kitchen dexamethasone (DECADRON) 4 MG tablet Take 2 tablets (8 mg total) by mouth 2 (two) times daily with a meal. Take two times a day the day before Taxotere. Then take two times a day starting the day after chemo for 3 days.  30 tablet  1  . lidocaine-prilocaine (EMLA) cream Apply topically as needed.  30 g  7  . LORazepam (ATIVAN) 0.5 MG tablet Take 1 tablet (0.5 mg total) by mouth every 6 (six) hours as needed (Nausea or vomiting).  30 tablet  0  . metFORMIN (GLUCOPHAGE) 1000 MG tablet Take 1,000 mg by mouth 2 (two) times daily with a meal.      . Multiple Vitamins-Minerals (MULTIVITAMIN WITH MINERALS) tablet Take 1 tablet by mouth daily.      Marland Kitchen  naproxen sodium (ANAPROX) 220 MG tablet Take 220 mg by mouth as needed.      . Nutritional Supplements (FIBER FORMULA) POWD Take 30 mLs by mouth daily. tkes 2-3 tablespoons daily for constipation      . ondansetron (ZOFRAN) 8 MG tablet Take 1 tablet (8 mg total) by mouth 2 (two) times daily. Take two times a day starting the day after chemo for 3 days. Then take two times a day as needed for nausea or vomiting.  30 tablet  1  . prochlorperazine (COMPAZINE) 10 MG tablet Take 1 tablet (10 mg total) by mouth every 6 (six) hours as needed (Nausea or vomiting).  30 tablet  1  . prochlorperazine (COMPAZINE) 25 MG suppository Place 1 suppository (25 mg total) rectally every 12 (twelve) hours as needed for nausea.  12 suppository  3  . Vitamins C E (CRANBERRY CONCENTRATE PO) Take 1 tablet by mouth 2 (two) times daily.         Current Facility-Administered Medications  Medication Dose Route Frequency Provider Last Rate Last Dose  . 0.9 %  sodium chloride infusion   Intravenous Once Augustin Schooling, NP       Facility-Administered Medications Ordered in Other Visits  Medication Dose Route Frequency Provider Last Rate Last Dose  . 0.9 %  sodium chloride infusion   Intravenous Once Victorino December, MD      . acetaminophen (TYLENOL) tablet 650 mg  650 mg Oral Once Victorino December, MD      . diphenhydrAMINE (BENADRYL) capsule 50 mg  50 mg Oral Once Victorino December, MD      . heparin lock flush 100 unit/mL  500 Units Intracatheter Once PRN Victorino December, MD      . sodium chloride 0.9 % injection 10 mL  10 mL Intracatheter PRN Victorino December, MD      . trastuzumab (HERCEPTIN) 168 mg in sodium chloride 0.9 % 250 mL chemo infusion  2 mg/kg (Treatment Plan Actual) Intravenous Once Victorino December, MD        SURGICAL HISTORY:  Past Surgical History  Procedure Date  . Nose surgery 1953  . Tonsillectomy 1955  . Nasal fracture surgery     as a child x 2  . Tonsillectomy   . Breast surgery 70's    right  . Appendectomy   . Bilateral cataract surgery   . Colonoscopy   . Neuroplasty / transposition median nerve at carpal tunnel bilateral   . Cardiac catheterization 20+yrs ago  . Breast lumpectomy with needle localization and axillary sentinel lymph node bx 02/07/2012    Procedure: BREAST LUMPECTOMY WITH NEEDLE LOCALIZATION AND AXILLARY SENTINEL LYMPH NODE BX;  Surgeon: Currie Paris, MD;  Location: [facility] OR;  Service: General;  Laterality: Right;  Right needle loc lumpectomy and Senitel Lymph Node Biopsy   . Abdominal hysterectomy     ovaries intact  . Portacath placement 03/26/2012    Procedure: INSERTION PORT-A-CATH;  Surgeon: Currie Paris, MD;  Location: [facility];  Service: General;  Laterality: Left;    REVIEW OF SYSTEMS:   General: fatigue (-), night sweats (-), fever (-), pain  (-) Lymph: palpable nodes (-) HEENT: vision changes (-), mucositis (-), gum bleeding (-), epistaxis (-) Cardiovascular: chest pain (-), palpitations (+) Pulmonary: shortness of breath (-), dyspnea on exertion (-), cough (-), hemoptysis (-) GI:  Early satiety (-), melena (-), dysphagia (-), nausea/vomiting (-), diarrhea (-) GU: dysuria (-), hematuria (-),  incontinence (-) Musculoskeletal: joint swelling (-), joint pain (-), back pain (-) Neuro: weakness (-), numbness (-), headache (-), confusion (-) Skin: Rash (-), lesions (-), dryness (-) Psych: depression (-), suicidal/homicidal ideation (-), feeling of hopelessness (-)   PHYSICAL EXAMINATION: Blood pressure 149/80, pulse 97, temperature 98 F (36.7 C), resp. rate 20, height 5' 1.5" (1.562 m), weight 181 lb 9.6 oz (82.373 kg). Body mass index is 33.76 kg/(m^2). General: Patient is a well appearing female in no acute distress HEENT: PERRLA, sclerae anicteric no conjunctival pallor, MMM, tympanic membranes intact, BLM, no erythema Neck: supple, no palpable adenopathy Lungs: clear to auscultation bilaterally, no wheezes, rhonchi, or rales Cardiovascular: regular rate rhythm, S1, S2, no murmurs, rubs or gallops Abdomen: Soft, non-tender, non-distended, normoactive bowel sounds, no HSM Extremities: warm and well perfused, no clubbing, cyanosis, or edema Skin: No rashes or lesions Neuro: Non-focal ECOG PERFORMANCE STATUS: 0 - Asymptomatic  LABORATORY DATA: Lab Results  Component Value Date   WBC 11.9* 04/06/2012   HGB 14.4 04/06/2012   HCT 42.3 04/06/2012   MCV 87.9 04/06/2012   PLT 200 04/06/2012      Chemistry      Component Value Date/Time   NA 141 03/30/2012 0857   NA 138 01/31/2012 1030   K 4.3 03/30/2012 0857   K 4.9 01/31/2012 1030   CL 105 03/30/2012 0857   CL 98 01/31/2012 1030   CO2 24 03/30/2012 0857   CO2 28 01/31/2012 1030   BUN 24.0 03/30/2012 0857   BUN 18 01/31/2012 1030   CREATININE 0.9 03/30/2012 0857   CREATININE 0.78  01/31/2012 1030      Component Value Date/Time   CALCIUM 10.1 03/30/2012 0857   CALCIUM 9.8 01/31/2012 1030   ALKPHOS 80 03/30/2012 0857   ALKPHOS 75 01/31/2012 1030   AST 14 03/30/2012 0857   AST 32 01/31/2012 1030   ALT 20 03/30/2012 0857   ALT 23 01/31/2012 1030   BILITOT 0.46 03/30/2012 0857   BILITOT 0.4 01/31/2012 1030       RADIOGRAPHIC STUDIES:  No results found.  ASSESSMENT: 77 year old female with  #1 stage II A. (T1 C. N1 a  cM0) invasive ductal carcinoma Measuring 1.8 cm ER positive PR positive HER-2/neu positive. One sentinel node was positive for metastatic disease.  #2 patient is a good candidate for adjuvant HER-2-based therapy. We discussed treatment with Taxotere carboplatinum and Herceptin. The Taxotere carboplatinum will be given every 3 weeks with day 2 Neulasta for a total of 6 cycles. The Herceptin will be given weekly for the duration of the chemotherapy and then changed to every 3 weeks to complete one year of Herceptin therapy.  #3 because patient has had a lumpectomy she will also require radiation therapy the patient has been seen by Dr. Ballard Russell regarding this we will refer her back to Dr. Dayton Scrape after she completes her chemotherapy.   PLAN:   #1 Ms. Guard has tolerated her first cycle of TCH well.  She will proceed with Herceptin and IV fluids today.  She will see Dr. Gala Romney on Monday.    #2 Ms. Arsenau will f/u with Dr. Rolm Baptise regarding her hearing change on 1/20.    #3 We will see her back next week for Herceptin.    All questions were answered. The patient knows to call the clinic with any problems, questions or concerns. We can certainly see the patient much sooner if necessary.  I spent 25 minutes counseling the patient face to face.  The total time spent in the appointment was 30 minutes.   Cherie Ouch Lyn Hollingshead, NP Medical Oncology [facility] Phone: [phone] 04/06/2012, 12:49 PM

## 2012-04-06 NOTE — Patient Instructions (Addendum)
Doing well.  Labs look good.  Proceed with Herceptin, IV fluids.  We will see you back next week.  Please call us if you have any questions or concerns.

## 2012-04-06 NOTE — Patient Instructions (Addendum)
[facility] Discharge Instructions for Patients Receiving Chemotherapy  Today you received the following chemotherapy agents Herceptin  To help prevent nausea and vomiting after your treatment, we encourage you to take your nausea medication as prescribed.   If you develop nausea and vomiting that is not controlled by your nausea medication, call the clinic. If it is after clinic hours your family physician or the after hours number for the clinic or go to the Emergency Department.   BELOW ARE SYMPTOMS THAT SHOULD BE REPORTED IMMEDIATELY:  *FEVER GREATER THAN 100.5 F  *CHILLS WITH OR WITHOUT FEVER  NAUSEA AND VOMITING THAT IS NOT CONTROLLED WITH YOUR NAUSEA MEDICATION  *UNUSUAL SHORTNESS OF BREATH  *UNUSUAL BRUISING OR BLEEDING  TENDERNESS IN MOUTH AND THROAT WITH OR WITHOUT PRESENCE OF ULCERS  *URINARY PROBLEMS  *BOWEL PROBLEMS  UNUSUAL RASH Items with * indicate a potential emergency and should be followed up as soon as possible.  Feel free to call the clinic you have any questions or concerns. The clinic phone number is [phone].   I have been informed and understand all the instructions given to me. I know to contact the clinic, my physician, or go to the Emergency Department if any problems should occur. I do not have any questions at this time, but understand that I may call the clinic during office hours   should I have any questions or need assistance in obtaining follow up care.   Dehydration, Adult Dehydration is when you lose more fluids from the body than you take in. Vital organs like the kidneys, brain, and heart cannot function without a proper amount of fluids and salt. Any loss of fluids from the body can cause dehydration.  CAUSES   Vomiting.  Diarrhea.  Excessive sweating.  Excessive urine output.  Fever. SYMPTOMS  Mild dehydration  Thirst.  Dry lips.  Slightly dry mouth. Moderate dehydration  Very dry mouth.  Sunken  eyes.  Skin does not bounce back quickly when lightly pinched and released.  Dark urine and decreased urine production.  Decreased tear production.  Headache. Severe dehydration  Very dry mouth.  Extreme thirst.  Rapid, weak pulse (more than 100 beats per minute at rest).  Cold hands and feet.  Not able to sweat in spite of heat and temperature.  Rapid breathing.  Blue lips.  Confusion and lethargy.  Difficulty being awakened.  Minimal urine production.  No tears. DIAGNOSIS  Your caregiver will diagnose dehydration based on your symptoms and your exam. Blood and urine tests will help confirm the diagnosis. The diagnostic evaluation should also identify the cause of dehydration. TREATMENT  Treatment of mild or moderate dehydration can often be done at home by increasing the amount of fluids that you drink. It is best to drink small amounts of fluid more often. Drinking too much at one time can make vomiting worse. Refer to the home care instructions below. Severe dehydration needs to be treated at the hospital where you will probably be given intravenous (IV) fluids that contain water and electrolytes. HOME CARE INSTRUCTIONS   Ask your caregiver about specific rehydration instructions.  Drink enough fluids to keep your urine clear or pale yellow.  Drink small amounts frequently if you have nausea and vomiting.  Eat as you normally do.  Avoid:  Foods or drinks high in sugar.  Carbonated drinks.  Juice.  Extremely hot or cold fluids.  Drinks with caffeine.  Fatty, greasy foods.  Alcohol.  Tobacco.  Overeating.  Gelatin desserts.  Wash your hands well to avoid spreading bacteria and viruses.  Only take over-the-counter or prescription medicines for pain, discomfort, or fever as directed by your caregiver.  Ask your caregiver if you should continue all prescribed and over-the-counter medicines.  Keep all follow-up appointments with your  caregiver. SEEK MEDICAL CARE IF:  You have abdominal pain and it increases or stays in one area (localizes).  You have a rash, stiff neck, or severe headache.  You are irritable, sleepy, or difficult to awaken.  You are weak, dizzy, or extremely thirsty. SEEK IMMEDIATE MEDICAL CARE IF:   You are unable to keep fluids down or you get worse despite treatment.  You have frequent episodes of vomiting or diarrhea.  You have blood or green matter (bile) in your vomit.  You have blood in your stool or your stool looks black and tarry.  You have not urinated in 6 to 8 hours, or you have only urinated a small amount of very dark urine.  You have a fever.  You faint. MAKE SURE YOU:   Understand these instructions.  Will watch your condition.  Will get help right away if you are not doing well or get worse. Document Released: 03/14/2005 Document Revised: 06/06/2011 Document Reviewed: 11/01/2010 [facility] Patient Information 2013 [place], [place].

## 2012-04-09 ENCOUNTER — Encounter (HOSPITAL_COMMUNITY): Payer: Self-pay

## 2012-04-09 ENCOUNTER — Ambulatory Visit (HOSPITAL_COMMUNITY): Payer: Medicare Other

## 2012-04-09 ENCOUNTER — Ambulatory Visit (HOSPITAL_COMMUNITY)
Admission: RE | Admit: 2012-04-09 | Discharge: 2012-04-09 | Disposition: A | Discharge status: Home / Self Care | Payer: Medicare Other | Source: Ambulatory Visit | Attending: Internal Medicine | Admitting: Internal Medicine

## 2012-04-09 VITALS — BP 166/90 | HR 97 | Wt 184.8 lb

## 2012-04-09 DIAGNOSIS — I10 Essential (primary) hypertension: Secondary | ICD-10-CM | POA: Insufficient documentation

## 2012-04-09 DIAGNOSIS — C50911 Malignant neoplasm of unspecified site of right female breast: Secondary | ICD-10-CM

## 2012-04-09 DIAGNOSIS — C50919 Malignant neoplasm of unspecified site of unspecified female breast: Secondary | ICD-10-CM | POA: Insufficient documentation

## 2012-04-09 MED ORDER — CARVEDILOL 3.125 MG PO TABS: 3.1250 mg | ORAL_TABLET | Freq: Two times a day (BID) | ORAL | Status: DC

## 2012-04-09 NOTE — Assessment & Plan Note (Addendum)
Explained purpose of HF clinic as it relates to breast cancer treatment. Explained that she has ~10% chance of cardiotoxicity with Herceptin.  Reviewed pretreatment ECHO. Given LVH and elevated SBP will add carvedilol 3.125 mg twice a day. She will be followed closely in HF clinic every 3 months with an ECHO . Follow up in 3 months with an ECHO.

## 2012-04-09 NOTE — Patient Instructions (Addendum)
Follow up in 3 months with an ECHO  Take carvedilol 3.125 mg twice a day.   Do the following things EVERYDAY: 1) Weigh yourself in the morning before breakfast. Write it down and keep it in a log. 2) Take your medicines as prescribed 3) Eat low salt foods-Limit salt (sodium) to 2000 mg per day.  4) Stay as active as you can everyday 5) Limit all fluids for the day to less than 2 liters

## 2012-04-09 NOTE — Assessment & Plan Note (Addendum)
She has untreated HTN with severe LVH on echo. Explained that she has essential HTN. Asked her to keep following BPs at home with goal SBP > 140. As noted above start carvedilol 3.125 mg twice a day. If SBP remains elevated will need to add ace inhibitor.

## 2012-04-09 NOTE — Progress Notes (Signed)
Patient ID: LINDE WILENSKY, female   DOB: April 17, 1935, 77 y.o.   MRN: [id number] Oncologist: Dr Welton Flakes General Surgeon: Dr Jamey Ripa Radiation Oncologist: Dr Dayton Scrape PCP: Dr Sharlot Gowda  HPI: Ms. Hayworth is a 77 year old female with new diagnosis of invasive ductal carcinoma that is ER positive PR positive HER-2/neu positive, hypothyroid (not on medicaitons), DM II, and HOH.   02/21/2012 when she was diagnosed with invasive ductal carcinoma of the right breast stage II a with a positive lymph node. She underwent a lumpectomy and sentinel lymph node biopsy.The final pathology revealed a 1.8 cm invasive ductal carcinoma with low grade ductal carcinoma in situ the tumor was grade 2 ER positive PR positive Ki-67 was 11%. HER-2/neu was equivocal by cish. Plan for Taxotere carboplatinum and Herceptin. Taxotere carboplatinum will be given every 3 weeks with day 2 Neulasta for a total of 6 cycles. The Herceptin will be given weekly for the duration of the chemotherapy and then changed to every 3 weeks to complete one year of Herceptin therapy.   Denies any h/o heart disease. Had cath in 1996 in [place], [place]. 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 Clean cors. Normal EF. Says she has white coat HTN  She presents today as a new patient at the request of Dr Welton Flakes.Denies SOB/PND/Orthopnea.  Lives alone. Started Herceptin 03/30/12. SBP > 140 at home.  ECHO 03/29/12 EF 60-65% lateral S' 8.3 severe LVH    Review of Systems:     Cardiac Review of Systems: {Y] = yes [ ]  = no  Chest Pain [    ]  Resting SOB [   ] Exertional SOB  [  ]  Orthopnea [  ]   Pedal Edema [   ]    Palpitations [  ] Syncope  [  ]   Presyncope [   ]  General Review of Systems: [Y] = yes [  ]=no Constitional: recent weight change [  ]; anorexia [  ]; fatigue [ Y ]; nausea [  ]; night sweats [  ]; fever [  ]; or chills [  ];                                                                                                                                           Dental: poor dentition[  ]; Last Dentist visit:   Eye : blurred vision [  ]; diplopia [   ]; vision changes [  ];  Amaurosis fugax[  ]; Resp: cough [  ];  wheezing[  ];  hemoptysis[  ]; shortness of breath[  ]; paroxysmal nocturnal dyspnea[  ]; dyspnea on exertion[  ]; or orthopnea[  ];  GI:  gallstones[  ], vomiting[  ];  dysphagia[  ]; melena[  ];  hematochezia [  ]; heartburn[  ];   Hx of  Colonoscopy[  ]; GU: kidney stones [  ];  hematuria[  ];   dysuria [  ];  nocturia[  ];  history of     obstruction [  ];                 Skin: rash, swelling[  ];, hair loss[  ];  peripheral edema[  ];  or itching[  ]; Musculosketetal: myalgias[  ];  joint swelling[  ];  joint erythema[  ];  joint pain[  ];  back pain[  ];  Heme/Lymph: bruising[  ];  bleeding[  ];  anemia[  ];  Neuro: TIA[  ];  headaches[  ];  stroke[  ];  vertigo[  ];  seizures[  ];   paresthesias[  ];  difficulty walking[  ];  Psych:depression[  ]; anxiety[  ];  Endocrine: diabetes[Y  ];  thyroid dysfunction[ Y ];  Immunizations: Flu [  ]; Pneumococcal[  ];  Other:    Past Medical History  Diagnosis Date  . Hyperlipidemia     doesn't require meds  . Asthma     pt states its "not bad"  . Back pain     buldging disc and pt says 2 are "flat"  . Hemorrhoids   . History of colon polyps   . Diverticulosis   . Cancer     breast right  . Hard of hearing   . Diabetes mellitus without complication     takes Metformin daily,Type II  . Allergy   . Cataract     b/l surgery  . Hypothyroidism     hx of;but has been off of meds >58yrs ago  . Wears glasses   . Hypertension     history, was on b/p  meds years ago  . PONV (postoperative nausea and vomiting)     Current Outpatient Prescriptions  Medication Sig Dispense Refill  . aspirin 81 MG tablet Take 81 mg by mouth daily.      Marland Kitchen CINNAMON PO Take 1 tablet by mouth 2 (two) times daily.       . metFORMIN (GLUCOPHAGE) 1000 MG tablet Take 1,000 mg by mouth 2  (two) times daily with a meal.      . Multiple Vitamins-Minerals (MULTIVITAMIN WITH MINERALS) tablet Take 1 tablet by mouth daily.      . Nutritional Supplements (FIBER FORMULA) POWD Take 30 mLs by mouth daily. tkes 2-3 tablespoons daily for constipation      . Vitamins C E (CRANBERRY CONCENTRATE PO) Take 1 tablet by mouth 2 (two) times daily.       . carvedilol (COREG) 3.125 MG tablet Take 1 tablet (3.125 mg total) by mouth 2 (two) times daily with a meal.  60 tablet  3  . dexamethasone (DECADRON) 4 MG tablet Take 2 tablets (8 mg total) by mouth 2 (two) times daily with a meal. Take two times a day the day before Taxotere. Then take two times a day starting the day after chemo for 3 days.  30 tablet  1  . lidocaine-prilocaine (EMLA) cream Apply topically as needed.  30 g  7  . LORazepam (ATIVAN) 0.5 MG tablet Take 1 tablet (0.5 mg total) by mouth every 6 (six) hours as needed (Nausea or vomiting).  30 tablet  0  . naproxen sodium (ANAPROX) 220 MG tablet Take 220 mg by mouth as needed.      . ondansetron (ZOFRAN) 8 MG tablet Take 1 tablet (8 mg total) by mouth 2 (two) times daily. Take two times a day starting the  day after chemo for 3 days. Then take two times a day as needed for nausea or vomiting.  30 tablet  1  . prochlorperazine (COMPAZINE) 10 MG tablet Take 1 tablet (10 mg total) by mouth every 6 (six) hours as needed (Nausea or vomiting).  30 tablet  1  . prochlorperazine (COMPAZINE) 25 MG suppository Place 1 suppository (25 mg total) rectally every 12 (twelve) hours as needed for nausea.  12 suppository  3     No Known Allergies  History   Social History  . Marital Status: Legally Separated    Spouse Name: N/A    Number of Children: N/A  . Years of Education: N/A   Occupational History  . Not on file.   Social History Main Topics  . Smoking status: Former Games developer  . Smokeless tobacco: Not on file     Comment: quit smoking 26yrs ago  . Alcohol Use: No  . Drug Use: No  .  Sexually Active: No   Other Topics Concern  . Not on file   Social History Narrative  . No narrative on file    Family History  Problem Relation Age of Onset  . Cancer Father     colon cancer  died 53,  of parkinson's     PHYSICAL EXAM: Filed Vitals:   04/09/12 1103  BP: 166/90  Pulse: 97   General:  elderly. No respiratory difficulty HEENT: normal Neck: supple. no JVD. Carotids 2+ bilat; no bruits. No lymphadenopathy or thryomegaly appreciated. Cor: PMI nondisplaced. Regular rate & rhythm. No rubs, gallops or murmurs. Lungs: clear Abdomen: soft, nontender, nondistended. No hepatosplenomegaly. No bruits or masses. Good bowel sounds. Extremities: no cyanosis, clubbing, rash, edema Neuro: alert & oriented x 3, cranial nerves grossly intact except for reduced hearing. moves all 4 extremities w/o difficulty. Affect pleasant,  ASSESSMENT & PLAN:

## 2012-04-10 ENCOUNTER — Ambulatory Visit (INDEPENDENT_AMBULATORY_CARE_PROVIDER_SITE_OTHER): Payer: Medicare Other | Admitting: Surgery

## 2012-04-10 ENCOUNTER — Telehealth: Payer: Self-pay | Admitting: *Deleted

## 2012-04-10 ENCOUNTER — Encounter (INDEPENDENT_AMBULATORY_CARE_PROVIDER_SITE_OTHER): Payer: Self-pay | Admitting: Surgery

## 2012-04-10 VITALS — BP 150/80 | HR 68 | Temp 97.7°F | Resp 18 | Ht 65.0 in | Wt 181.8 lb

## 2012-04-10 DIAGNOSIS — Z09 Encounter for follow-up examination after completed treatment for conditions other than malignant neoplasm: Secondary | ICD-10-CM

## 2012-04-10 NOTE — Progress Notes (Signed)
Chief complaint: Postop Port-A-Cath placement  History of present illness: This patient underwent Port-A-Cath placement a few weeks ago. She is done well. The port has been accessed twice with no problems.  Exam: Incision is healing nicely with no evidence of problems  Impression: Doing well  Plan: I will see her after she finished chemotherapy, probably in about 4 months. Of note is that her best friend, who usually accompanies her apparently has just been diagnosed with metastatic melanoma.

## 2012-04-10 NOTE — Telephone Encounter (Signed)
1. We can reduce her dose of benadryl on her next treatment with herceptin  2. She can take ibuprofen or tylenol for the aches and pain. If that does not work then we can give tramadol 50 mg q 12 hrs prn pain

## 2012-04-10 NOTE — Patient Instructions (Signed)
See me again when you have finished chemotherapy

## 2012-04-10 NOTE — Telephone Encounter (Signed)
Pt called states " I have treatment on Friday- Herceptin w/ Tylenol 650mg  , Benadryl  50mg  and when I take the benadryl I get so sleepy. I have to drive home to [facility] about 1 1/2. Do I have to take that? Also, I've been having a lot of Bone aches and I cant sleep really good. Is there something I can take for that?" Discussed with pt I will review with MD and call her back with further instructions.

## 2012-04-11 ENCOUNTER — Telehealth: Payer: Self-pay | Admitting: *Deleted

## 2012-04-11 NOTE — Telephone Encounter (Signed)
Pt called states " I think some one is trying to call me" discussed with pt per MD, ok to take decreased dose of benadryl with next herceptin treatment, and to try tylenol, advil or ibuprofen for bone aches. If it does not help pain we can call her in pain medication. Pt verbalized she did not check any of her messages on phone, will try the ibuprofen and will call back if bone aches are still a problem.

## 2012-04-11 NOTE — Telephone Encounter (Signed)
Per MD, unable to reach pt on hm# busy- called cell # lmovm notified pt ok to reduce benadryl on next treatment with herceptin., informe dpt we can call in pain medicaiton if tylenol, advil or ibuprofen does not help.

## 2012-04-13 ENCOUNTER — Ambulatory Visit (HOSPITAL_BASED_OUTPATIENT_CLINIC_OR_DEPARTMENT_OTHER): Payer: Medicare Other | Admitting: Adult Health

## 2012-04-13 ENCOUNTER — Ambulatory Visit (HOSPITAL_BASED_OUTPATIENT_CLINIC_OR_DEPARTMENT_OTHER): Payer: Medicare Other

## 2012-04-13 ENCOUNTER — Encounter: Payer: Self-pay | Admitting: Adult Health

## 2012-04-13 ENCOUNTER — Other Ambulatory Visit (HOSPITAL_BASED_OUTPATIENT_CLINIC_OR_DEPARTMENT_OTHER): Payer: Medicare Other | Admitting: Lab

## 2012-04-13 VITALS — BP 165/77 | HR 88 | Temp 97.7°F | Resp 20 | Ht 65.0 in | Wt 183.5 lb

## 2012-04-13 DIAGNOSIS — C50911 Malignant neoplasm of unspecified site of right female breast: Secondary | ICD-10-CM

## 2012-04-13 DIAGNOSIS — C50419 Malignant neoplasm of upper-outer quadrant of unspecified female breast: Secondary | ICD-10-CM

## 2012-04-13 DIAGNOSIS — C773 Secondary and unspecified malignant neoplasm of axilla and upper limb lymph nodes: Secondary | ICD-10-CM

## 2012-04-13 DIAGNOSIS — Z5112 Encounter for antineoplastic immunotherapy: Secondary | ICD-10-CM

## 2012-04-13 DIAGNOSIS — Z170 Estrogen receptor positive status [ER+]: Secondary | ICD-10-CM

## 2012-04-13 LAB — COMPREHENSIVE METABOLIC PANEL (CC13)
ALT: 29 U/L (ref 0–55)
AST: 16 U/L (ref 5–34)
Albumin: 3.4 g/dL — ABNORMAL LOW (ref 3.5–5.0)
Alkaline Phosphatase: 132 U/L (ref 40–150)
BUN: 18 mg/dL (ref 7.0–26.0)
CO2: 26 mEq/L (ref 22–29)
Calcium: 9.4 mg/dL (ref 8.4–10.4)
Chloride: 103 mEq/L (ref 98–107)
Creatinine: 0.8 mg/dL (ref 0.6–1.1)
Glucose: 137 mg/dl — ABNORMAL HIGH (ref 70–99)
Potassium: 4 mEq/L (ref 3.5–5.1)
Sodium: 139 mEq/L (ref 136–145)
Total Bilirubin: 0.42 mg/dL (ref 0.20–1.20)
Total Protein: 6.8 g/dL (ref 6.4–8.3)

## 2012-04-13 LAB — CBC WITH DIFFERENTIAL/PLATELET
EOS%: 0.4 % (ref 0.0–7.0)
LYMPH%: 10.7 % — ABNORMAL LOW (ref 14.0–49.7)
MCH: 29.7 pg (ref 25.1–34.0)
MCV: 86.5 fL (ref 79.5–101.0)
MONO%: 4.2 % (ref 0.0–14.0)
Platelets: 145 10*3/uL (ref 145–400)
RBC: 4.31 10*6/uL (ref 3.70–5.45)
RDW: 13.3 % (ref 11.2–14.5)
nRBC: 0 % (ref 0–0)

## 2012-04-13 MED ORDER — DIPHENHYDRAMINE HCL 25 MG PO CAPS
25.0000 mg | ORAL_CAPSULE | Freq: Once | ORAL | Status: AC
  Administered 2012-04-13: 25 mg via ORAL

## 2012-04-13 MED ORDER — ACETAMINOPHEN 325 MG PO TABS
650.0000 mg | ORAL_TABLET | Freq: Once | ORAL | Status: AC
  Administered 2012-04-13: 650 mg via ORAL

## 2012-04-13 MED ORDER — SODIUM CHLORIDE 0.9 % IV SOLN
Freq: Once | INTRAVENOUS | Status: AC
  Administered 2012-04-13: 13:00:00 via INTRAVENOUS

## 2012-04-13 MED ORDER — SODIUM CHLORIDE 0.9 % IV SOLN
2.0000 mg/kg | Freq: Once | INTRAVENOUS | Status: AC
  Administered 2012-04-13: 168 mg via INTRAVENOUS
  Filled 2012-04-13: qty 8

## 2012-04-13 MED ORDER — SODIUM CHLORIDE 0.9 % IJ SOLN
10.0000 mL | INTRAMUSCULAR | Status: DC | PRN
  Administered 2012-04-13: 10 mL
  Filled 2012-04-13: qty 10

## 2012-04-13 MED ORDER — HEPARIN SOD (PORK) LOCK FLUSH 100 UNIT/ML IV SOLN
500.0000 [IU] | Freq: Once | INTRAVENOUS | Status: AC | PRN
  Administered 2012-04-13: 500 [IU]
  Filled 2012-04-13: qty 5

## 2012-04-13 NOTE — Progress Notes (Signed)
OFFICE PROGRESS NOTE  CC  [facility], MD [address] [place] [address] Dr. Cyndia Bent Dr. Chipper Herb  DIAGNOSIS: 77 year old female with new diagnosis of invasive ductal carcinoma that is ER positive PR positive HER-2/neu positive by [facility] Testing  PRIOR THERAPY:  #1 patient was originally seenBy me on 02/21/2012 when she was diagnosed with invasive ductal carcinoma of the right breast stage II a with a positive lymph node. She underwent a lumpectomy and sentinel lymph node biopsy.The final pathology revealed a 1.8 cm invasive ductal carcinoma with low grade ductal carcinoma in situ the tumor was grade 2 ER positive PR positive Ki-67 was 11%. HER-2/neu was equivocal by cish.  #2 she had a sentinel lymph node biopsy one node was positive for metastatic disease. Making her a stage II A.  #3 patient and I and her family had an extensive discussion at the original meeting and we sent her tumor for HER-2/neu testing by [facility]. By [facility]  HER-2/neu is positive.  CURRENT THERAPY: TCH cycle 1 Day 15 (weekly Herceptin)  INTERVAL HISTORY: Heather Vega 77 y.o. female returns for follow up for her weekly herceptin.  She is doing well.  She's having occasional epistaxis, mainly after blowing her nose.  Otherwise she's feeling well and denies fevers, chills, nausea, vomiting, shortness of breath, numbness or any other concerns.    MEDICAL HISTORY: Past Medical History  Diagnosis Date  . Hyperlipidemia     doesn't require meds  . Asthma     pt states its "not bad"  . Back pain     buldging disc and pt says 2 are "flat"  . Hemorrhoids   . History of colon polyps   . Diverticulosis   . Cancer     breast right  . Hard of hearing   . Diabetes mellitus without complication     takes Metformin daily,Type II  . Allergy   . Cataract     b/l surgery  . Hypothyroidism     hx of;but has been off of meds >35yrs ago  . Wears glasses   . Hypertension     history,  was on b/p  meds years ago  . PONV (postoperative nausea and vomiting)     ALLERGIES:   has no known allergies.  MEDICATIONS:  Current Outpatient Prescriptions  Medication Sig Dispense Refill  . aspirin 81 MG tablet Take 81 mg by mouth daily.      . carvedilol (COREG) 3.125 MG tablet Take 1 tablet (3.125 mg total) by mouth 2 (two) times daily with a meal.  60 tablet  3  . CINNAMON PO Take 1 tablet by mouth 2 (two) times daily.       Marland Kitchen dexamethasone (DECADRON) 4 MG tablet Take 2 tablets (8 mg total) by mouth 2 (two) times daily with a meal. Take two times a day the day before Taxotere. Then take two times a day starting the day after chemo for 3 days.  30 tablet  1  . lidocaine-prilocaine (EMLA) cream Apply topically as needed.  30 g  7  . LORazepam (ATIVAN) 0.5 MG tablet Take 1 tablet (0.5 mg total) by mouth every 6 (six) hours as needed (Nausea or vomiting).  30 tablet  0  . metFORMIN (GLUCOPHAGE) 1000 MG tablet Take 1,000 mg by mouth 2 (two) times daily with a meal.      . Multiple Vitamins-Minerals (MULTIVITAMIN WITH MINERALS) tablet Take 1 tablet by mouth daily.      Marland Kitchen  naproxen sodium (ANAPROX) 220 MG tablet Take 220 mg by mouth as needed.      . Nutritional Supplements (FIBER FORMULA) POWD Take 30 mLs by mouth daily. tkes 2-3 tablespoons daily for constipation      . ondansetron (ZOFRAN) 8 MG tablet Take 1 tablet (8 mg total) by mouth 2 (two) times daily. Take two times a day starting the day after chemo for 3 days. Then take two times a day as needed for nausea or vomiting.  30 tablet  1  . prochlorperazine (COMPAZINE) 10 MG tablet Take 1 tablet (10 mg total) by mouth every 6 (six) hours as needed (Nausea or vomiting).  30 tablet  1  . prochlorperazine (COMPAZINE) 25 MG suppository Place 1 suppository (25 mg total) rectally every 12 (twelve) hours as needed for nausea.  12 suppository  3  . Vitamins C E (CRANBERRY CONCENTRATE PO) Take 1 tablet by mouth 2 (two) times daily.        No  current facility-administered medications for this visit.   Facility-Administered Medications Ordered in Other Visits  Medication Dose Route Frequency Provider Last Rate Last Dose  . sodium chloride 0.9 % injection 10 mL  10 mL Intracatheter PRN Victorino December, MD   10 mL at 04/13/12 1417    SURGICAL HISTORY:  Past Surgical History  Procedure Date  . Nose surgery 1953  . Tonsillectomy 1955  . Nasal fracture surgery     as a child x 2  . Tonsillectomy   . Breast surgery 70's    right  . Appendectomy   . Bilateral cataract surgery   . Colonoscopy   . Neuroplasty / transposition median nerve at carpal tunnel bilateral   . Cardiac catheterization 20+yrs ago  . Breast lumpectomy with needle localization and axillary sentinel lymph node bx 02/07/2012    Procedure: BREAST LUMPECTOMY WITH NEEDLE LOCALIZATION AND AXILLARY SENTINEL LYMPH NODE BX;  Surgeon: Currie Paris, MD;  Location: [facility] OR;  Service: General;  Laterality: Right;  Right needle loc lumpectomy and Senitel Lymph Node Biopsy   . Abdominal hysterectomy     ovaries intact  . Portacath placement 03/26/2012    Procedure: INSERTION PORT-A-CATH;  Surgeon: Currie Paris, MD;  Location: [facility];  Service: General;  Laterality: Left;    REVIEW OF SYSTEMS:   General: fatigue (-), night sweats (-), fever (-), pain (-) Lymph: palpable nodes (-) HEENT: vision changes (-), mucositis (-), gum bleeding (-), epistaxis (-) Cardiovascular: chest pain (-), palpitations (+) Pulmonary: shortness of breath (-), dyspnea on exertion (-), cough (-), hemoptysis (-) GI:  Early satiety (-), melena (-), dysphagia (-), nausea/vomiting (-), diarrhea (-) GU: dysuria (-), hematuria (-), incontinence (-) Musculoskeletal: joint swelling (-), joint pain (-), back pain (-) Neuro: weakness (-), numbness (-), headache (-), confusion (-) Skin: Rash (-), lesions (-), dryness (-) Psych: depression (-), suicidal/homicidal ideation (-),  feeling of hopelessness (-)   PHYSICAL EXAMINATION: Blood pressure 165/77, pulse 88, temperature 97.7 F (36.5 C), resp. rate 20, height 5\' 5"  (1.651 m), weight 183 lb 8 oz (83.235 kg). Body mass index is 30.54 kg/(m^2). General: Patient is a well appearing female in no acute distress HEENT: PERRLA, sclerae anicteric no conjunctival pallor, MMM, tympanic membranes intact, BLM, no erythema Neck: supple, no palpable adenopathy Lungs: clear to auscultation bilaterally, no wheezes, rhonchi, or rales Cardiovascular: regular rate rhythm, S1, S2, no murmurs, rubs or gallops Abdomen: Soft, non-tender, non-distended, normoactive bowel sounds, no HSM Extremities:  warm and well perfused, no clubbing, cyanosis, or edema Skin: No rashes or lesions Neuro: Non-focal ECOG PERFORMANCE STATUS: 0 - Asymptomatic  LABORATORY DATA: Lab Results  Component Value Date   WBC 15.1* 04/13/2012   HGB 12.8 04/13/2012   HCT 37.3 04/13/2012   MCV 86.5 04/13/2012   PLT 145 04/13/2012      Chemistry      Component Value Date/Time   NA 139 04/13/2012 1101   NA 138 01/31/2012 1030   K 4.0 04/13/2012 1101   K 4.9 01/31/2012 1030   CL 103 04/13/2012 1101   CL 98 01/31/2012 1030   CO2 26 04/13/2012 1101   CO2 28 01/31/2012 1030   BUN 18.0 04/13/2012 1101   BUN 18 01/31/2012 1030   CREATININE 0.8 04/13/2012 1101   CREATININE 0.78 01/31/2012 1030      Component Value Date/Time   CALCIUM 9.4 04/13/2012 1101   CALCIUM 9.8 01/31/2012 1030   ALKPHOS 132 04/13/2012 1101   ALKPHOS 75 01/31/2012 1030   AST 16 04/13/2012 1101   AST 32 01/31/2012 1030   ALT 29 04/13/2012 1101   ALT 23 01/31/2012 1030   BILITOT 0.42 04/13/2012 1101   BILITOT 0.4 01/31/2012 1030       RADIOGRAPHIC STUDIES:  No results found.  ASSESSMENT: 77 year old female with  #1 stage II A. (T1 C. N1 a  cM0) invasive ductal carcinoma Measuring 1.8 cm ER positive PR positive HER-2/neu positive. One sentinel node was positive for metastatic disease.  #2 patient  is a good candidate for adjuvant HER-2-based therapy. We discussed treatment with Taxotere carboplatinum and Herceptin. The Taxotere carboplatinum will be given every 3 weeks with day 2 Neulasta for a total of 6 cycles. The Herceptin will be given weekly for the duration of the chemotherapy and then changed to every 3 weeks to complete one year of Herceptin therapy.  #3 because patient has had a lumpectomy she will also require radiation therapy the patient has been seen by Dr. Ballard Russell regarding this we will refer her back to Dr. Dayton Scrape after she completes her chemotherapy.   PLAN:   #1 Ms. Stensland has tolerated her first cycle of TCH well.  She will proceed with Herceptin today.  She saw Dr. Gala Romney this past Monday and he started her on Carvedilol.  She is tolerating this well.    #2 Ms. Arsenau will f/u with Dr. Rolm Baptise regarding her hearing change on 1/20.    #3 We will see her back next week for her [facility].    All questions were answered. The patient knows to call the clinic with any problems, questions or concerns. We can certainly see the patient much sooner if necessary.  I spent 25 minutes counseling the patient face to face. The total time spent in the appointment was 30 minutes.   Cherie Ouch Lyn Hollingshead, NP Medical Oncology [facility] Phone: [phone] 04/13/2012, 2:31 PM

## 2012-04-13 NOTE — Patient Instructions (Addendum)
Doing well.  Use saline nasal spray twice a day to keep your nares moist.  Please call us if you have any questions or concerns.

## 2012-04-15 NOTE — Progress Notes (Signed)
OFFICE PROGRESS NOTE  CC  [facility], MD [address] [place] [address] Dr. Cyndia Bent Dr. Chipper Herb  DIAGNOSIS: 77 year old female with new diagnosis of invasive ductal carcinoma that is ER positive PR positive HER-2/neu positive by [facility] Testing  PRIOR THERAPY:  #1 patient was originally seenBy me on 02/21/2012 when she was diagnosed with invasive ductal carcinoma of the right breast stage II a with a positive lymph node. She underwent a lumpectomy and sentinel lymph node biopsy.The final pathology revealed a 1.8 cm invasive ductal carcinoma with low grade ductal carcinoma in situ the tumor was grade 2 ER positive PR positive Ki-67 was 11%. HER-2/neu was equivocal by cish.  #2 she had a sentinel lymph node biopsy one node was positive for metastatic disease. Making her a stage II A.  #3 patient and I and her family had an extensive discussion at the original meeting and we sent her tumor for HER-2/neu testing by [facility]. By [facility]  HER-2/neu is positive.  #4 patient is now going to be receiving combination chemotherapy consisting of Taxotere carboplatinum and Herceptin Beginning 03/30/2012 She will receive TCH combination every 21 days. She will receive day 2 Neulasta when she receives [facility]. On other weeks she will receive weekly Herceptin. A total of 6 cycles Of TCH is planned. Once she completes this she will then proceed with radiation therapy and we will plan on changing her Herceptin to every 3 weeks to complete out one year of therapy.  CURRENT THERAPY: Cycle 1 day 1 of TCH With day 2 Neulasta  INTERVAL HISTORY: Heather Vega 77 y.o. female returns forFollowup visit today. Overall she is doing well she is without any significant complaints. She denies any nausea vomiting fevers chills night sweats headaches shortness of breath chest pains palpitation her right breast is a little bit tender from her surgery. Otherwise she has no complaints. She is  accompanied by her best friend And her son.  MEDICAL HISTORY: Past Medical History  Diagnosis Date  . Hyperlipidemia     doesn't require meds  . Asthma     pt states its "not bad"  . Back pain     buldging disc and pt says 2 are "flat"  . Hemorrhoids   . History of colon polyps   . Diverticulosis   . Cancer     breast right  . Hard of hearing   . Diabetes mellitus without complication     takes Metformin daily,Type II  . Allergy   . Cataract     b/l surgery  . Hypothyroidism     hx of;but has been off of meds >2yrs ago  . Wears glasses   . Hypertension     history, was on b/p  meds years ago  . PONV (postoperative nausea and vomiting)     ALLERGIES:   has no known allergies.  MEDICATIONS:  Current Outpatient Prescriptions  Medication Sig Dispense Refill  . aspirin 81 MG tablet Take 81 mg by mouth daily.      Marland Kitchen CINNAMON PO Take 1 tablet by mouth 2 (two) times daily.       Marland Kitchen dexamethasone (DECADRON) 4 MG tablet Take 2 tablets (8 mg total) by mouth 2 (two) times daily with a meal. Take two times a day the day before Taxotere. Then take two times a day starting the day after chemo for 3 days.  30 tablet  1  . lidocaine-prilocaine (EMLA) cream Apply topically as needed.  30  g  7  . LORazepam (ATIVAN) 0.5 MG tablet Take 1 tablet (0.5 mg total) by mouth every 6 (six) hours as needed (Nausea or vomiting).  30 tablet  0  . metFORMIN (GLUCOPHAGE) 1000 MG tablet Take 1,000 mg by mouth 2 (two) times daily with a meal.      . Multiple Vitamins-Minerals (MULTIVITAMIN WITH MINERALS) tablet Take 1 tablet by mouth daily.      . naproxen sodium (ANAPROX) 220 MG tablet Take 220 mg by mouth as needed.      . Nutritional Supplements (FIBER FORMULA) POWD Take 30 mLs by mouth daily. tkes 2-3 tablespoons daily for constipation      . ondansetron (ZOFRAN) 8 MG tablet Take 1 tablet (8 mg total) by mouth 2 (two) times daily. Take two times a day starting the day after chemo for 3 days. Then take  two times a day as needed for nausea or vomiting.  30 tablet  1  . prochlorperazine (COMPAZINE) 10 MG tablet Take 1 tablet (10 mg total) by mouth every 6 (six) hours as needed (Nausea or vomiting).  30 tablet  1  . prochlorperazine (COMPAZINE) 25 MG suppository Place 1 suppository (25 mg total) rectally every 12 (twelve) hours as needed for nausea.  12 suppository  3  . Vitamins C E (CRANBERRY CONCENTRATE PO) Take 1 tablet by mouth 2 (two) times daily.       . carvedilol (COREG) 3.125 MG tablet Take 1 tablet (3.125 mg total) by mouth 2 (two) times daily with a meal.  60 tablet  3    SURGICAL HISTORY:  Past Surgical History  Procedure Date  . Nose surgery 1953  . Tonsillectomy 1955  . Nasal fracture surgery     as a child x 2  . Tonsillectomy   . Breast surgery 70's    right  . Appendectomy   . Bilateral cataract surgery   . Colonoscopy   . Neuroplasty / transposition median nerve at carpal tunnel bilateral   . Cardiac catheterization 20+yrs ago  . Breast lumpectomy with needle localization and axillary sentinel lymph node bx 02/07/2012    Procedure: BREAST LUMPECTOMY WITH NEEDLE LOCALIZATION AND AXILLARY SENTINEL LYMPH NODE BX;  Surgeon: Currie Paris, MD;  Location: [facility] OR;  Service: General;  Laterality: Right;  Right needle loc lumpectomy and Senitel Lymph Node Biopsy   . Abdominal hysterectomy     ovaries intact  . Portacath placement 03/26/2012    Procedure: INSERTION PORT-A-CATH;  Surgeon: Currie Paris, MD;  Location: [facility];  Service: General;  Laterality: Left;    REVIEW OF SYSTEMS:  Pertinent items are noted in HPI.   HEALTH MAINTENANCE:   PHYSICAL EXAMINATION: Blood pressure 182/75, pulse 94, temperature 97.5 F (36.4 C), resp. rate 20, height 5' 1.5" (1.562 m), weight 188 lb 8 oz (85.503 kg). Body mass index is 35.04 kg/(m^2). ECOG PERFORMANCE STATUS: 0 - Asymptomatic   General appearance: alert, cooperative and appears stated  age Resp: clear to auscultation bilaterally Back: symmetric, no curvature. ROM normal. No CVA tenderness. Cardio: regular rate and rhythm GI: soft, non-tender; bowel sounds normal; no masses,  no organomegaly Extremities: extremities normal, atraumatic, no cyanosis or edema Neurologic: Grossly normal   LABORATORY DATA: Lab Results  Component Value Date   WBC 15.1* 04/13/2012   HGB 12.8 04/13/2012   HCT 37.3 04/13/2012   MCV 86.5 04/13/2012   PLT 145 04/13/2012      Chemistry  Component Value Date/Time   NA 139 04/13/2012 1101   NA 138 01/31/2012 1030   K 4.0 04/13/2012 1101   K 4.9 01/31/2012 1030   CL 103 04/13/2012 1101   CL 98 01/31/2012 1030   CO2 26 04/13/2012 1101   CO2 28 01/31/2012 1030   BUN 18.0 04/13/2012 1101   BUN 18 01/31/2012 1030   CREATININE 0.8 04/13/2012 1101   CREATININE 0.78 01/31/2012 1030      Component Value Date/Time   CALCIUM 9.4 04/13/2012 1101   CALCIUM 9.8 01/31/2012 1030   ALKPHOS 132 04/13/2012 1101   ALKPHOS 75 01/31/2012 1030   AST 16 04/13/2012 1101   AST 32 01/31/2012 1030   ALT 29 04/13/2012 1101   ALT 23 01/31/2012 1030   BILITOT 0.42 04/13/2012 1101   BILITOT 0.4 01/31/2012 1030       RADIOGRAPHIC STUDIES:  No results found.  ASSESSMENT: 77 year old female with  #1 stage II A. (T1 C. N1 a  cM0) invasive ductal carcinoma Measuring 1.8 cm ER positive PR positive HER-2/neu positive. One sentinel node was positive for metastatic disease.  #2 patient is a good candidate for adjuvant HER-2-based therapy. We discussed treatment with Taxotere carboplatinum and Herceptin. The Taxotere carboplatinum will be given every 3 weeks with day 2 Neulasta for a total of 6 cycles. The Herceptin will be given weekly for the duration of the chemotherapy and then changed to every 3 weeks to complete one year of Herceptin therapy.  #3 because patient has had a lumpectomy she will also require radiation therapy the patient has been seen by Dr. Ballard Russell regarding  this we will refer her back to Dr. Dayton Scrape after she completes her chemotherapy.   PLAN:  #1 patient today will proceed with cycle 1 of Taxotere carboplatinum and Herceptin. She understands the risks and benefits. Her son accompanied her today to the visit as well as her friend.  #2 she will return in one week's time for Herceptin only.  All questions were answered. The patient knows to call the clinic with any problems, questions or concerns. We can certainly see the patient much sooner if necessary.  I spent 30 minutes counseling the patient face to face. The total time spent in the appointment was 30 minutes.    Drue Second, MD Medical/Oncology [facility] [phone] (beeper) [phone] (Office)

## 2012-04-16 ENCOUNTER — Other Ambulatory Visit: Payer: Self-pay | Admitting: Certified Registered Nurse Anesthetist

## 2012-04-20 ENCOUNTER — Other Ambulatory Visit (HOSPITAL_BASED_OUTPATIENT_CLINIC_OR_DEPARTMENT_OTHER): Payer: Medicare Other | Admitting: Lab

## 2012-04-20 ENCOUNTER — Ambulatory Visit (HOSPITAL_BASED_OUTPATIENT_CLINIC_OR_DEPARTMENT_OTHER): Payer: Medicare Other

## 2012-04-20 ENCOUNTER — Ambulatory Visit (HOSPITAL_BASED_OUTPATIENT_CLINIC_OR_DEPARTMENT_OTHER): Payer: Medicare Other | Admitting: Adult Health

## 2012-04-20 ENCOUNTER — Encounter: Payer: Self-pay | Admitting: Adult Health

## 2012-04-20 VITALS — BP 153/73 | HR 105 | Temp 98.3°F | Resp 20 | Ht 65.0 in | Wt 184.5 lb

## 2012-04-20 DIAGNOSIS — Z5112 Encounter for antineoplastic immunotherapy: Secondary | ICD-10-CM

## 2012-04-20 DIAGNOSIS — Z170 Estrogen receptor positive status [ER+]: Secondary | ICD-10-CM

## 2012-04-20 DIAGNOSIS — C773 Secondary and unspecified malignant neoplasm of axilla and upper limb lymph nodes: Secondary | ICD-10-CM

## 2012-04-20 DIAGNOSIS — Z5111 Encounter for antineoplastic chemotherapy: Secondary | ICD-10-CM

## 2012-04-20 DIAGNOSIS — C50911 Malignant neoplasm of unspecified site of right female breast: Secondary | ICD-10-CM

## 2012-04-20 DIAGNOSIS — R000 Tachycardia, unspecified: Secondary | ICD-10-CM

## 2012-04-20 DIAGNOSIS — E860 Dehydration: Secondary | ICD-10-CM

## 2012-04-20 DIAGNOSIS — C50419 Malignant neoplasm of upper-outer quadrant of unspecified female breast: Secondary | ICD-10-CM

## 2012-04-20 LAB — CBC WITH DIFFERENTIAL/PLATELET
BASO%: 0.1 % (ref 0.0–2.0)
EOS%: 0 % (ref 0.0–7.0)
HCT: 36.4 % (ref 34.8–46.6)
LYMPH%: 7.3 % — ABNORMAL LOW (ref 14.0–49.7)
MCH: 30 pg (ref 25.1–34.0)
MCHC: 33.8 g/dL (ref 31.5–36.0)
NEUT%: 87.9 % — ABNORMAL HIGH (ref 38.4–76.8)
Platelets: 268 10*3/uL (ref 145–400)

## 2012-04-20 LAB — COMPREHENSIVE METABOLIC PANEL (CC13)
AST: 13 U/L (ref 5–34)
Alkaline Phosphatase: 116 U/L (ref 40–150)
BUN: 28.7 mg/dL — ABNORMAL HIGH (ref 7.0–26.0)
Calcium: 9.9 mg/dL (ref 8.4–10.4)
Chloride: 104 mEq/L (ref 98–107)
Creatinine: 0.8 mg/dL (ref 0.6–1.1)

## 2012-04-20 MED ORDER — HEPARIN SOD (PORK) LOCK FLUSH 100 UNIT/ML IV SOLN
500.0000 [IU] | Freq: Once | INTRAVENOUS | Status: AC | PRN
  Administered 2012-04-20: 500 [IU]
  Filled 2012-04-20: qty 5

## 2012-04-20 MED ORDER — ACETAMINOPHEN 325 MG PO TABS
650.0000 mg | ORAL_TABLET | Freq: Once | ORAL | Status: AC
  Administered 2012-04-20: 650 mg via ORAL

## 2012-04-20 MED ORDER — ONDANSETRON 16 MG/50ML IVPB (CHCC)
16.0000 mg | Freq: Once | INTRAVENOUS | Status: AC
  Administered 2012-04-20: 16 mg via INTRAVENOUS

## 2012-04-20 MED ORDER — DIPHENHYDRAMINE HCL 25 MG PO CAPS
25.0000 mg | ORAL_CAPSULE | Freq: Once | ORAL | Status: AC
  Administered 2012-04-20: 25 mg via ORAL

## 2012-04-20 MED ORDER — SODIUM CHLORIDE 0.9 % IV SOLN
Freq: Once | INTRAVENOUS | Status: DC
  Administered 2012-04-20: 12:00:00 via INTRAVENOUS

## 2012-04-20 MED ORDER — DOCETAXEL CHEMO INJECTION 160 MG/16ML
75.0000 mg/m2 | Freq: Once | INTRAVENOUS | Status: AC
  Administered 2012-04-20: 150 mg via INTRAVENOUS
  Filled 2012-04-20: qty 15

## 2012-04-20 MED ORDER — SODIUM CHLORIDE 0.9 % IJ SOLN
10.0000 mL | INTRAMUSCULAR | Status: DC | PRN
  Administered 2012-04-20: 10 mL
  Filled 2012-04-20: qty 10

## 2012-04-20 MED ORDER — SODIUM CHLORIDE 0.9 % IV SOLN: Freq: Once | INTRAVENOUS | Status: DC

## 2012-04-20 MED ORDER — TRASTUZUMAB CHEMO INJECTION 440 MG
2.0000 mg/kg | Freq: Once | INTRAVENOUS | Status: AC
  Administered 2012-04-20: 168 mg via INTRAVENOUS
  Filled 2012-04-20: qty 8

## 2012-04-20 MED ORDER — DEXAMETHASONE SODIUM PHOSPHATE 4 MG/ML IJ SOLN
20.0000 mg | Freq: Once | INTRAMUSCULAR | Status: AC
  Administered 2012-04-20: 20 mg via INTRAVENOUS

## 2012-04-20 MED ORDER — SODIUM CHLORIDE 0.9 % IV SOLN
652.8000 mg | Freq: Once | INTRAVENOUS | Status: AC
  Administered 2012-04-20: 650 mg via INTRAVENOUS
  Filled 2012-04-20: qty 65

## 2012-04-20 NOTE — Patient Instructions (Addendum)
Doing well.  Proceed with chemotherapy.  We will see you back next week for your weekly Herceptin.  Please keep a check of your heart rate over the weekend, and should it continue to be increased, call Dr. Prescott Gum office on Monday.  Please call us if you have any questions or concerns.

## 2012-04-20 NOTE — Progress Notes (Signed)
OFFICE PROGRESS NOTE  CC  [facility], MD [address] [place] [address] Dr. Cyndia Bent Dr. Chipper Herb  DIAGNOSIS: 77 year old female with new diagnosis of invasive ductal carcinoma that is ER positive PR positive HER-2/neu positive by [facility] Testing  PRIOR THERAPY:  #1 patient was originally seenBy me on 02/21/2012 when she was diagnosed with invasive ductal carcinoma of the right breast stage II a with a positive lymph node. She underwent a lumpectomy and sentinel lymph node biopsy.The final pathology revealed a 1.8 cm invasive ductal carcinoma with low grade ductal carcinoma in situ the tumor was grade 2 ER positive PR positive Ki-67 was 11%. HER-2/neu was equivocal by cish.  #2 she had a sentinel lymph node biopsy one node was positive for metastatic disease. Making her a stage II A.  #3 patient and I and her family had an extensive discussion at the original meeting and we sent her tumor for HER-2/neu testing by [facility]. By [facility]  HER-2/neu is positive.  #4 patient is now going to be receiving combination chemotherapy consisting of Taxotere carboplatinum and Herceptin Beginning 03/30/2012 She will receive TCH combination every 21 days. She will receive day 2 Neulasta when she receives [facility]. On other weeks she will receive weekly Herceptin. A total of 6 cycles Of TCH is planned. Once she completes this she will then proceed with radiation therapy and we will plan on changing her Herceptin to every 3 weeks to complete out one year of therapy.  CURRENT THERAPY: Cycle 2 day 1 of TCH With day 2 Neulasta, and weekly herceptin  INTERVAL HISTORY: Heather Vega 77 y.o. female returns for followup visit today.  She continues to due well.  She's had no further hearing changes, and the feeling of something being in her ear has resolved.  She denies numbness, tingling, fevers, chills, or any other concerns.  She's slightly tachycardic today, but admits that she has  missed a couple of doses of Carvedilol.  Otherwise, a 10 point ROS is neg.    MEDICAL HISTORY: Past Medical History  Diagnosis Date  . Hyperlipidemia     doesn't require meds  . Asthma     pt states its "not bad"  . Back pain     buldging disc and pt says 2 are "flat"  . Hemorrhoids   . History of colon polyps   . Diverticulosis   . Cancer     breast right  . Hard of hearing   . Diabetes mellitus without complication     takes Metformin daily,Type II  . Allergy   . Cataract     b/l surgery  . Hypothyroidism     hx of;but has been off of meds >67yrs ago  . Wears glasses   . Hypertension     history, was on b/p  meds years ago  . PONV (postoperative nausea and vomiting)     ALLERGIES:   has no known allergies.  MEDICATIONS:  Current Outpatient Prescriptions  Medication Sig Dispense Refill  . aspirin 81 MG tablet Take 81 mg by mouth daily.      . carvedilol (COREG) 3.125 MG tablet Take 1 tablet (3.125 mg total) by mouth 2 (two) times daily with a meal.  60 tablet  3  . CINNAMON PO Take 1 tablet by mouth 2 (two) times daily.       Marland Kitchen dexamethasone (DECADRON) 4 MG tablet Take 2 tablets (8 mg total) by mouth 2 (two) times daily with a  meal. Take two times a day the day before Taxotere. Then take two times a day starting the day after chemo for 3 days.  30 tablet  1  . lidocaine-prilocaine (EMLA) cream Apply topically as needed.  30 g  7  . metFORMIN (GLUCOPHAGE) 1000 MG tablet Take 1,000 mg by mouth 2 (two) times daily with a meal.      . Multiple Vitamins-Minerals (MULTIVITAMIN WITH MINERALS) tablet Take 1 tablet by mouth daily.      . naproxen sodium (ANAPROX) 220 MG tablet Take 220 mg by mouth as needed.      . Nutritional Supplements (FIBER FORMULA) POWD Take 30 mLs by mouth daily. tkes 2-3 tablespoons daily for constipation      . ondansetron (ZOFRAN) 8 MG tablet Take 1 tablet (8 mg total) by mouth 2 (two) times daily. Take two times a day starting the day after chemo for 3  days. Then take two times a day as needed for nausea or vomiting.  30 tablet  1  . Vitamins C E (CRANBERRY CONCENTRATE PO) Take 1 tablet by mouth 2 (two) times daily.       Marland Kitchen LORazepam (ATIVAN) 0.5 MG tablet Take 1 tablet (0.5 mg total) by mouth every 6 (six) hours as needed (Nausea or vomiting).  30 tablet  0  . prochlorperazine (COMPAZINE) 10 MG tablet Take 1 tablet (10 mg total) by mouth every 6 (six) hours as needed (Nausea or vomiting).  30 tablet  1  . prochlorperazine (COMPAZINE) 25 MG suppository Place 1 suppository (25 mg total) rectally every 12 (twelve) hours as needed for nausea.  12 suppository  3   Current Facility-Administered Medications  Medication Dose Route Frequency Provider Last Rate Last Dose  . 0.9 %  sodium chloride infusion   Intravenous Once Augustin Schooling, NP        SURGICAL HISTORY:  Past Surgical History  Procedure Date  . Nose surgery 1953  . Tonsillectomy 1955  . Nasal fracture surgery     as a child x 2  . Tonsillectomy   . Breast surgery 70's    right  . Appendectomy   . Bilateral cataract surgery   . Colonoscopy   . Neuroplasty / transposition median nerve at carpal tunnel bilateral   . Cardiac catheterization 20+yrs ago  . Breast lumpectomy with needle localization and axillary sentinel lymph node bx 02/07/2012    Procedure: BREAST LUMPECTOMY WITH NEEDLE LOCALIZATION AND AXILLARY SENTINEL LYMPH NODE BX;  Surgeon: Currie Paris, MD;  Location: [facility] OR;  Service: General;  Laterality: Right;  Right needle loc lumpectomy and Senitel Lymph Node Biopsy   . Abdominal hysterectomy     ovaries intact  . Portacath placement 03/26/2012    Procedure: INSERTION PORT-A-CATH;  Surgeon: Currie Paris, MD;  Location: [facility];  Service: General;  Laterality: Left;    REVIEW OF SYSTEMS:   General: fatigue (-), night sweats (-), fever (-), pain (-) Lymph: palpable nodes (-) HEENT: vision changes (-), mucositis (-), gum bleeding (-),  epistaxis (-) Cardiovascular: chest pain (-), palpitations (-) Pulmonary: shortness of breath (-), dyspnea on exertion (-), cough (-), hemoptysis (-) GI:  Early satiety (-), melena (-), dysphagia (-), nausea/vomiting (-), diarrhea (-) GU: dysuria (-), hematuria (-), incontinence (-) Musculoskeletal: joint swelling (-), joint pain (-), back pain (-) Neuro: weakness (-), numbness (-), headache (-), confusion (-) Skin: Rash (-), lesions (-), dryness (-) Psych: depression (-), suicidal/homicidal ideation (-), feeling of hopelessness (-)  PHYSICAL EXAMINATION: Blood pressure 153/73, pulse 105, temperature 98.3 F (36.8 C), temperature source Oral, resp. rate 20, height 5\' 5"  (1.651 m), weight 184 lb 8 oz (83.689 kg). Body mass index is 30.70 kg/(m^2). General: Patient is a well appearing female in no acute distress HEENT: PERRLA, sclerae anicteric no conjunctival pallor, MMM Neck: supple, no palpable adenopathy Lungs: clear to auscultation bilaterally, no wheezes, rhonchi, or rales Cardiovascular: regular rate rhythm, S1, S2, no murmurs, rubs or gallops, HR 97 on recheck Abdomen: Soft, non-tender, non-distended, normoactive bowel sounds, no HSM Extremities: warm and well perfused, no clubbing, cyanosis, or edema Skin: No rashes or lesions Neuro: Non-focal Right breast lumpectomy site without nodularity ECOG PERFORMANCE STATUS: 0 - Asymptomatic      LABORATORY DATA: Lab Results  Component Value Date   WBC 18.7* 04/20/2012   HGB 12.3 04/20/2012   HCT 36.4 04/20/2012   MCV 88.8 04/20/2012   PLT 268 04/20/2012      Chemistry      Component Value Date/Time   NA 139 04/13/2012 1101   NA 138 01/31/2012 1030   K 4.0 04/13/2012 1101   K 4.9 01/31/2012 1030   CL 103 04/13/2012 1101   CL 98 01/31/2012 1030   CO2 26 04/13/2012 1101   CO2 28 01/31/2012 1030   BUN 18.0 04/13/2012 1101   BUN 18 01/31/2012 1030   CREATININE 0.8 04/13/2012 1101   CREATININE 0.78 01/31/2012 1030      Component  Value Date/Time   CALCIUM 9.4 04/13/2012 1101   CALCIUM 9.8 01/31/2012 1030   ALKPHOS 132 04/13/2012 1101   ALKPHOS 75 01/31/2012 1030   AST 16 04/13/2012 1101   AST 32 01/31/2012 1030   ALT 29 04/13/2012 1101   ALT 23 01/31/2012 1030   BILITOT 0.42 04/13/2012 1101   BILITOT 0.4 01/31/2012 1030       RADIOGRAPHIC STUDIES:  No results found.  ASSESSMENT: 77 year old female with  #1 stage II A. (T1 C. N1 a  cM0) invasive ductal carcinoma Measuring 1.8 cm ER positive PR positive HER-2/neu positive. One sentinel node was positive for metastatic disease.  #2 patient is a good candidate for adjuvant HER-2-based therapy. We discussed treatment with Taxotere carboplatinum and Herceptin. The Taxotere carboplatinum will be given every 3 weeks with day 2 Neulasta for a total of 6 cycles. The Herceptin will be given weekly for the duration of the chemotherapy and then changed to every 3 weeks to complete one year of Herceptin therapy.  #3 because patient has had a lumpectomy she will also require radiation therapy the patient has been seen by Dr. Ballard Russell regarding this we will refer her back to Dr. Dayton Scrape after she completes her chemotherapy.  #4 Tachycardia PLAN:  #1 patient today will proceed with cycle 2 of Taxotere carboplatinum and Herceptin.   #2 she will return in one week's time for Herceptin only.  #3 I will give her 1L IV fluids today.  She will keep a record of her HR over the weekend, and call Dr. bensimhon's office on Monday should it continue to be elevated.    All questions were answered. The patient knows to call the clinic with any problems, questions or concerns. We can certainly see the patient much sooner if necessary.  I spent 25 minutes counseling the patient face to face. The total time spent in the appointment was 30 minutes.    Cherie Ouch Lyn Hollingshead, NP Medical Oncology [facility] Phone: [phone]

## 2012-04-20 NOTE — Patient Instructions (Signed)
[facility] Discharge Instructions for Patients Receiving Chemotherapy  Today you received the following chemotherapy agents Taxotere/Herceptin/Carboplatin To help prevent nausea and vomiting after your treatment, we encourage you to take your nausea medication as prescribed.  If you develop nausea and vomiting that is not controlled by your nausea medication, call the clinic. If it is after clinic hours your family physician or the after hours number for the clinic or go to the Emergency Department.   BELOW ARE SYMPTOMS THAT SHOULD BE REPORTED IMMEDIATELY:  *FEVER GREATER THAN 100.5 F  *CHILLS WITH OR WITHOUT FEVER  NAUSEA AND VOMITING THAT IS NOT CONTROLLED WITH YOUR NAUSEA MEDICATION  *UNUSUAL SHORTNESS OF BREATH  *UNUSUAL BRUISING OR BLEEDING  TENDERNESS IN MOUTH AND THROAT WITH OR WITHOUT PRESENCE OF ULCERS  *URINARY PROBLEMS  *BOWEL PROBLEMS  UNUSUAL RASH Items with * indicate a potential emergency and should be followed up as soon as possible.  One of the nurses will contact you 24 hours after your treatment. Please let the nurse know about any problems that you may have experienced. Feel free to call the clinic you have any questions or concerns. The clinic phone number is [phone].   I have been informed and understand all the instructions given to me. I know to contact the clinic, my physician, or go to the Emergency Department if any problems should occur. I do not have any questions at this time, but understand that I may call the clinic during office hours   should I have any questions or need assistance in obtaining follow up care.    __________________________________________  _____________  __________ Signature of Patient or Authorized Representative            Date                   Time    __________________________________________ Nurse's Signature

## 2012-04-21 ENCOUNTER — Ambulatory Visit (HOSPITAL_BASED_OUTPATIENT_CLINIC_OR_DEPARTMENT_OTHER): Payer: Medicare Other

## 2012-04-21 VITALS — BP 164/77 | HR 71 | Temp 97.5°F

## 2012-04-21 DIAGNOSIS — C773 Secondary and unspecified malignant neoplasm of axilla and upper limb lymph nodes: Secondary | ICD-10-CM

## 2012-04-21 DIAGNOSIS — C50419 Malignant neoplasm of upper-outer quadrant of unspecified female breast: Secondary | ICD-10-CM

## 2012-04-21 DIAGNOSIS — C50911 Malignant neoplasm of unspecified site of right female breast: Secondary | ICD-10-CM

## 2012-04-21 MED ORDER — PEGFILGRASTIM INJECTION 6 MG/0.6ML
6.0000 mg | Freq: Once | SUBCUTANEOUS | Status: AC
  Administered 2012-04-21: 6 mg via SUBCUTANEOUS

## 2012-04-27 ENCOUNTER — Other Ambulatory Visit (HOSPITAL_BASED_OUTPATIENT_CLINIC_OR_DEPARTMENT_OTHER): Payer: Medicare Other | Admitting: Lab

## 2012-04-27 ENCOUNTER — Ambulatory Visit (HOSPITAL_BASED_OUTPATIENT_CLINIC_OR_DEPARTMENT_OTHER): Payer: Medicare Other

## 2012-04-27 ENCOUNTER — Ambulatory Visit (HOSPITAL_BASED_OUTPATIENT_CLINIC_OR_DEPARTMENT_OTHER): Payer: Medicare Other | Admitting: Adult Health

## 2012-04-27 ENCOUNTER — Encounter: Payer: Self-pay | Admitting: Adult Health

## 2012-04-27 VITALS — BP 130/75 | HR 90 | Temp 98.4°F | Resp 18 | Ht 65.0 in | Wt 177.5 lb

## 2012-04-27 DIAGNOSIS — C50419 Malignant neoplasm of upper-outer quadrant of unspecified female breast: Secondary | ICD-10-CM

## 2012-04-27 DIAGNOSIS — C773 Secondary and unspecified malignant neoplasm of axilla and upper limb lymph nodes: Secondary | ICD-10-CM

## 2012-04-27 DIAGNOSIS — C50911 Malignant neoplasm of unspecified site of right female breast: Secondary | ICD-10-CM

## 2012-04-27 DIAGNOSIS — Z5112 Encounter for antineoplastic immunotherapy: Secondary | ICD-10-CM

## 2012-04-27 DIAGNOSIS — E860 Dehydration: Secondary | ICD-10-CM

## 2012-04-27 DIAGNOSIS — R197 Diarrhea, unspecified: Secondary | ICD-10-CM

## 2012-04-27 DIAGNOSIS — Z170 Estrogen receptor positive status [ER+]: Secondary | ICD-10-CM

## 2012-04-27 LAB — CBC WITH DIFFERENTIAL/PLATELET
BASO%: 0.3 % (ref 0.0–2.0)
EOS%: 1.7 % (ref 0.0–7.0)
Eosinophils Absolute: 0.1 10*3/uL (ref 0.0–0.5)
LYMPH%: 31.3 % (ref 14.0–49.7)
MCH: 29.7 pg (ref 25.1–34.0)
MCHC: 33.9 g/dL (ref 31.5–36.0)
MCV: 87.6 fL (ref 79.5–101.0)
MONO%: 8.8 % (ref 0.0–14.0)
Platelets: 234 10*3/uL (ref 145–400)
RBC: 4.28 10*6/uL (ref 3.70–5.45)
RDW: 14 % (ref 11.2–14.5)
nRBC: 0 % (ref 0–0)

## 2012-04-27 LAB — COMPREHENSIVE METABOLIC PANEL (CC13)
ALT: 32 U/L (ref 0–55)
Alkaline Phosphatase: 116 U/L (ref 40–150)
CO2: 26 mEq/L (ref 22–29)
Creatinine: 0.8 mg/dL (ref 0.6–1.1)
Total Bilirubin: 0.47 mg/dL (ref 0.20–1.20)

## 2012-04-27 MED ORDER — DIPHENHYDRAMINE HCL 25 MG PO CAPS
25.0000 mg | ORAL_CAPSULE | Freq: Once | ORAL | Status: AC
  Administered 2012-04-27: 25 mg via ORAL

## 2012-04-27 MED ORDER — SODIUM CHLORIDE 0.9 % IV SOLN
Freq: Once | INTRAVENOUS | Status: AC
  Administered 2012-04-27: 13:00:00 via INTRAVENOUS

## 2012-04-27 MED ORDER — TRASTUZUMAB CHEMO INJECTION 440 MG
2.0000 mg/kg | Freq: Once | INTRAVENOUS | Status: AC
  Administered 2012-04-27: 168 mg via INTRAVENOUS
  Filled 2012-04-27: qty 8

## 2012-04-27 MED ORDER — ACETAMINOPHEN 325 MG PO TABS
650.0000 mg | ORAL_TABLET | Freq: Once | ORAL | Status: AC
  Administered 2012-04-27: 650 mg via ORAL

## 2012-04-27 MED ORDER — SODIUM CHLORIDE 0.9 % IJ SOLN
10.0000 mL | INTRAMUSCULAR | Status: DC | PRN
Start: 2012-04-27 — End: 2012-04-27
  Administered 2012-04-27: 10 mL
  Filled 2012-04-27: qty 10

## 2012-04-27 MED ORDER — HEPARIN SOD (PORK) LOCK FLUSH 100 UNIT/ML IV SOLN
500.0000 [IU] | Freq: Once | INTRAVENOUS | Status: AC | PRN
  Administered 2012-04-27: 500 [IU]
  Filled 2012-04-27: qty 5

## 2012-04-27 NOTE — Progress Notes (Signed)
OFFICE PROGRESS NOTE  CC  [facility], MD [address] [place] [address] Dr. Cyndia Bent Dr. Chipper Herb  DIAGNOSIS: 77 year old female with new diagnosis of invasive ductal carcinoma that is ER positive PR positive HER-2/neu positive by [facility] Testing  PRIOR THERAPY:  #1 patient was originally seenBy me on 02/21/2012 when she was diagnosed with invasive ductal carcinoma of the right breast stage II a with a positive lymph node. She underwent a lumpectomy and sentinel lymph node biopsy.The final pathology revealed a 1.8 cm invasive ductal carcinoma with low grade ductal carcinoma in situ the tumor was grade 2 ER positive PR positive Ki-67 was 11%. HER-2/neu was equivocal by cish.  #2 she had a sentinel lymph node biopsy one node was positive for metastatic disease. Making her a stage II A.  #3 patient and I and her family had an extensive discussion at the original meeting and we sent her tumor for HER-2/neu testing by [facility]. By [facility]  HER-2/neu is positive.  #4 patient is now going to be receiving combination chemotherapy consisting of Taxotere carboplatinum and Herceptin Beginning 03/30/2012 She will receive TCH combination every 21 days. She will receive day 2 Neulasta when she receives [facility]. On other weeks she will receive weekly Herceptin. A total of 6 cycles Of TCH is planned. Once she completes this she will then proceed with radiation therapy and we will plan on changing her Herceptin to every 3 weeks to complete out one year of therapy.  CURRENT THERAPY: Cycle 2 day 8 of TCH With day 2 Neulasta, and weekly herceptin  INTERVAL HISTORY: Heather Vega 77 y.o. female returns for followup visit today.  She continues to do well with her chemotherapy.  She has been having 5-6 small formed bowel movements a day.  She does feel mildly dehydrated, though she is drinking several bottles of water per day.  Otherwise she is feeling well and denies fevers, chills,  nausea, vomiting, numbness, or any other concerns.    MEDICAL HISTORY: Past Medical History  Diagnosis Date  . Hyperlipidemia     doesn't require meds  . Asthma     pt states its "not bad"  . Back pain     buldging disc and pt says 2 are "flat"  . Hemorrhoids   . History of colon polyps   . Diverticulosis   . Cancer     breast right  . Hard of hearing   . Diabetes mellitus without complication     takes Metformin daily,Type II  . Allergy   . Cataract     b/l surgery  . Hypothyroidism     hx of;but has been off of meds >50yrs ago  . Wears glasses   . Hypertension     history, was on b/p  meds years ago  . PONV (postoperative nausea and vomiting)     ALLERGIES:   has no known allergies.  MEDICATIONS:  Current Outpatient Prescriptions  Medication Sig Dispense Refill  . aspirin 81 MG tablet Take 81 mg by mouth daily.      . carvedilol (COREG) 3.125 MG tablet Take 1 tablet (3.125 mg total) by mouth 2 (two) times daily with a meal.  60 tablet  3  . CINNAMON PO Take 1 tablet by mouth 2 (two) times daily.       Marland Kitchen dexamethasone (DECADRON) 4 MG tablet Take 2 tablets (8 mg total) by mouth 2 (two) times daily with a meal. Take two times a day  the day before Taxotere. Then take two times a day starting the day after chemo for 3 days.  30 tablet  1  . lidocaine-prilocaine (EMLA) cream Apply topically as needed.  30 g  7  . LORazepam (ATIVAN) 0.5 MG tablet Take 1 tablet (0.5 mg total) by mouth every 6 (six) hours as needed (Nausea or vomiting).  30 tablet  0  . metFORMIN (GLUCOPHAGE) 1000 MG tablet Take 1,000 mg by mouth 2 (two) times daily with a meal.      . Multiple Vitamins-Minerals (MULTIVITAMIN WITH MINERALS) tablet Take 1 tablet by mouth daily.      . naproxen sodium (ANAPROX) 220 MG tablet Take 220 mg by mouth as needed.      . Nutritional Supplements (FIBER FORMULA) POWD Take 30 mLs by mouth daily. tkes 2-3 tablespoons daily for constipation      . ondansetron (ZOFRAN) 8 MG  tablet Take 1 tablet (8 mg total) by mouth 2 (two) times daily. Take two times a day starting the day after chemo for 3 days. Then take two times a day as needed for nausea or vomiting.  30 tablet  1  . prochlorperazine (COMPAZINE) 10 MG tablet Take 1 tablet (10 mg total) by mouth every 6 (six) hours as needed (Nausea or vomiting).  30 tablet  1  . prochlorperazine (COMPAZINE) 25 MG suppository Place 1 suppository (25 mg total) rectally every 12 (twelve) hours as needed for nausea.  12 suppository  3  . Vitamins C E (CRANBERRY CONCENTRATE PO) Take 1 tablet by mouth 2 (two) times daily.        No current facility-administered medications for this visit.   Facility-Administered Medications Ordered in Other Visits  Medication Dose Route Frequency Provider Last Rate Last Dose  . sodium chloride 0.9 % injection 10 mL  10 mL Intracatheter PRN Victorino December, MD   10 mL at 04/27/12 1417    SURGICAL HISTORY:  Past Surgical History  Procedure Date  . Nose surgery 1953  . Tonsillectomy 1955  . Nasal fracture surgery     as a child x 2  . Tonsillectomy   . Breast surgery 70's    right  . Appendectomy   . Bilateral cataract surgery   . Colonoscopy   . Neuroplasty / transposition median nerve at carpal tunnel bilateral   . Cardiac catheterization 20+yrs ago  . Breast lumpectomy with needle localization and axillary sentinel lymph node bx 02/07/2012    Procedure: BREAST LUMPECTOMY WITH NEEDLE LOCALIZATION AND AXILLARY SENTINEL LYMPH NODE BX;  Surgeon: Currie Paris, MD;  Location: [facility];  Service: General;  Laterality: Right;  Right needle loc lumpectomy and Senitel Lymph Node Biopsy   . Abdominal hysterectomy     ovaries intact  . Portacath placement 03/26/2012    Procedure: INSERTION PORT-A-CATH;  Surgeon: Currie Paris, MD;  Location[facility];  Service: General;  Laterality: Left;    REVIEW OF SYSTEMS:   General: fatigue (-), night sweats (-), fever (-), pain  (-) Lymph: palpable nodes (-) HEENT: vision changes (-), mucositis (-), gum bleeding (-), epistaxis (-) Cardiovascular: chest pain (-), palpitations (-) Pulmonary: shortness of breath (-), dyspnea on exertion (-), cough (-), hemoptysis (-) GI:  Early satiety (-), melena (-), dysphagia (-), nausea/vomiting (-), diarrhea (+) GU: dysuria (-), hematuria (-), incontinence (-) Musculoskeletal: joint swelling (-), joint pain (-), back pain (-) Neuro: weakness (-), numbness (-), headache (-), confusion (-) Skin: Rash (-), lesions (-),  dryness (-) Psych: depression (-), suicidal/homicidal ideation (-), feeling of hopelessness (-)  PHYSICAL EXAMINATION: Blood pressure 130/75, pulse 90, temperature 98.4 F (36.9 C), temperature source Oral, resp. rate 18, height 5\' 5"  (1.651 m), weight 177 lb 8 oz (80.513 kg). Body mass index is 29.54 kg/(m^2). General: Patient is a well appearing female in no acute distress HEENT: PERRLA, sclerae anicteric no conjunctival pallor, MMM Neck: supple, no palpable adenopathy Lungs: clear to auscultation bilaterally, no wheezes, rhonchi, or rales Cardiovascular: regular rate rhythm, S1, S2, no murmurs, rubs or gallops Abdomen: Soft, non-tender, non-distended, normoactive bowel sounds, no HSM Extremities: warm and well perfused, no clubbing, cyanosis, or edema Skin: No rashes or lesions Neuro: Non-focal Right breast lumpectomy site without nodularity ECOG PERFORMANCE STATUS: 0 - Asymptomatic      LABORATORY DATA: Lab Results  Component Value Date   WBC 7.9 04/27/2012   HGB 12.7 04/27/2012   HCT 37.5 04/27/2012   MCV 87.6 04/27/2012   PLT 234 04/27/2012      Chemistry      Component Value Date/Time   NA 140 04/20/2012 1016   NA 138 01/31/2012 1030   K 4.1 04/20/2012 1016   K 4.9 01/31/2012 1030   CL 104 04/20/2012 1016   CL 98 01/31/2012 1030   CO2 22 04/20/2012 1016   CO2 28 01/31/2012 1030   BUN 28.7* 04/20/2012 1016   BUN 18 01/31/2012 1030   CREATININE 0.8  04/20/2012 1016   CREATININE 0.78 01/31/2012 1030      Component Value Date/Time   CALCIUM 9.9 04/20/2012 1016   CALCIUM 9.8 01/31/2012 1030   ALKPHOS 116 04/20/2012 1016   ALKPHOS 75 01/31/2012 1030   AST 13 04/20/2012 1016   AST 32 01/31/2012 1030   ALT 23 04/20/2012 1016   ALT 23 01/31/2012 1030   BILITOT 0.35 04/20/2012 1016   BILITOT 0.4 01/31/2012 1030       RADIOGRAPHIC STUDIES:  No results found.  ASSESSMENT: 77 year old female with  #1 stage II A. (T1 C. N1 a  cM0) invasive ductal carcinoma Measuring 1.8 cm ER positive PR positive HER-2/neu positive. One sentinel node was positive for metastatic disease.  #2 patient is a good candidate for adjuvant HER-2-based therapy. We discussed treatment with Taxotere carboplatinum and Herceptin. The Taxotere carboplatinum will be given every 3 weeks with day 2 Neulasta for a total of 6 cycles. The Herceptin will be given weekly for the duration of the chemotherapy and then changed to every 3 weeks to complete one year of Herceptin therapy.  #3 because patient has had a lumpectomy she will also require radiation therapy the patient has been seen by Dr. Ballard Russell regarding this we will refer her back to Dr. Dayton Scrape after she completes her chemotherapy.  #4 Dehydration  PLAN:   #1Ms. Heather Vega will proceed with weekly Herceptin today.    #2 I will give her 1L IV fluids today with the Herceptin due to her diarrhea.    #3 We will see her back next week for her next dose of Herceptin.    All questions were answered. The patient knows to call the clinic with any problems, questions or concerns. We can certainly see the patient much sooner if necessary.  I spent 25 minutes counseling the patient face to face. The total time spent in the appointment was 30 minutes.  Cherie Ouch Lyn Hollingshead, NP Medical Oncology [facility] Phone: [phone]

## 2012-04-27 NOTE — Patient Instructions (Signed)
Doing well.  Proceed with Herceptin.  We will also give you IV fluids today for dehydration.  Please call us if you have any questions or concerns.

## 2012-04-27 NOTE — Patient Instructions (Addendum)
 Cancer Center Discharge Instructions for Patients Receiving Chemotherapy  Today you received the following chemotherapy agents herceptin  To help prevent nausea and vomiting after your treatment, we encourage you to take your nausea medication as needed   If you develop nausea and vomiting that is not controlled by your nausea medication, call the clinic. If it is after clinic hours your family physician or the after hours number for the clinic or go to the Emergency Department.   BELOW ARE SYMPTOMS THAT SHOULD BE REPORTED IMMEDIATELY:  *FEVER GREATER THAN 100.5 F  *CHILLS WITH OR WITHOUT FEVER  NAUSEA AND VOMITING THAT IS NOT CONTROLLED WITH YOUR NAUSEA MEDICATION  *UNUSUAL SHORTNESS OF BREATH  *UNUSUAL BRUISING OR BLEEDING  TENDERNESS IN MOUTH AND THROAT WITH OR WITHOUT PRESENCE OF ULCERS  *URINARY PROBLEMS  *BOWEL PROBLEMS  UNUSUAL RASH Items with * indicate a potential emergency and should be followed up as soon as possible.  The clinic phone number is [phone].

## 2012-05-04 ENCOUNTER — Ambulatory Visit (HOSPITAL_BASED_OUTPATIENT_CLINIC_OR_DEPARTMENT_OTHER): Payer: Medicare Other

## 2012-05-04 ENCOUNTER — Encounter: Payer: Self-pay | Admitting: Adult Health

## 2012-05-04 ENCOUNTER — Other Ambulatory Visit (HOSPITAL_BASED_OUTPATIENT_CLINIC_OR_DEPARTMENT_OTHER): Payer: Medicare Other | Admitting: Lab

## 2012-05-04 ENCOUNTER — Ambulatory Visit (HOSPITAL_BASED_OUTPATIENT_CLINIC_OR_DEPARTMENT_OTHER): Payer: Medicare Other | Admitting: Adult Health

## 2012-05-04 VITALS — BP 134/69 | HR 84 | Temp 97.9°F | Resp 20 | Ht 65.0 in | Wt 179.0 lb

## 2012-05-04 DIAGNOSIS — Z5112 Encounter for antineoplastic immunotherapy: Secondary | ICD-10-CM

## 2012-05-04 DIAGNOSIS — C773 Secondary and unspecified malignant neoplasm of axilla and upper limb lymph nodes: Secondary | ICD-10-CM

## 2012-05-04 DIAGNOSIS — C50419 Malignant neoplasm of upper-outer quadrant of unspecified female breast: Secondary | ICD-10-CM

## 2012-05-04 DIAGNOSIS — C50911 Malignant neoplasm of unspecified site of right female breast: Secondary | ICD-10-CM

## 2012-05-04 DIAGNOSIS — I10 Essential (primary) hypertension: Secondary | ICD-10-CM

## 2012-05-04 DIAGNOSIS — Z170 Estrogen receptor positive status [ER+]: Secondary | ICD-10-CM

## 2012-05-04 LAB — COMPREHENSIVE METABOLIC PANEL (CC13)
ALT: 25 U/L (ref 0–55)
AST: 13 U/L (ref 5–34)
Alkaline Phosphatase: 138 U/L (ref 40–150)
CO2: 27 mEq/L (ref 22–29)
Creatinine: 0.7 mg/dL (ref 0.6–1.1)
Sodium: 138 mEq/L (ref 136–145)
Total Bilirubin: 0.47 mg/dL (ref 0.20–1.20)
Total Protein: 6.3 g/dL — ABNORMAL LOW (ref 6.4–8.3)

## 2012-05-04 LAB — CBC WITH DIFFERENTIAL/PLATELET
BASO%: 0.2 % (ref 0.0–2.0)
EOS%: 0.1 % (ref 0.0–7.0)
LYMPH%: 10.8 % — ABNORMAL LOW (ref 14.0–49.7)
MCH: 30.3 pg (ref 25.1–34.0)
MCHC: 33.8 g/dL (ref 31.5–36.0)
MONO#: 0.6 10*3/uL (ref 0.1–0.9)
MONO%: 4.2 % (ref 0.0–14.0)
NEUT%: 84.7 % — ABNORMAL HIGH (ref 38.4–76.8)
Platelets: 145 10*3/uL (ref 145–400)
RBC: 3.86 10*6/uL (ref 3.70–5.45)
WBC: 15.4 10*3/uL — ABNORMAL HIGH (ref 3.9–10.3)

## 2012-05-04 MED ORDER — ACETAMINOPHEN 325 MG PO TABS
650.0000 mg | ORAL_TABLET | Freq: Once | ORAL | Status: AC
  Administered 2012-05-04: 650 mg via ORAL

## 2012-05-04 MED ORDER — TRASTUZUMAB CHEMO INJECTION 440 MG
2.0000 mg/kg | Freq: Once | INTRAVENOUS | Status: AC
  Administered 2012-05-04: 168 mg via INTRAVENOUS
  Filled 2012-05-04: qty 8

## 2012-05-04 MED ORDER — SODIUM CHLORIDE 0.9 % IJ SOLN
10.0000 mL | INTRAMUSCULAR | Status: DC | PRN
  Administered 2012-05-04: 10 mL
  Filled 2012-05-04: qty 10

## 2012-05-04 MED ORDER — SODIUM CHLORIDE 0.9 % IV SOLN
Freq: Once | INTRAVENOUS | Status: AC
  Administered 2012-05-04: 13:00:00 via INTRAVENOUS

## 2012-05-04 MED ORDER — DIPHENHYDRAMINE HCL 25 MG PO CAPS
25.0000 mg | ORAL_CAPSULE | Freq: Once | ORAL | Status: AC
  Administered 2012-05-04: 25 mg via ORAL

## 2012-05-04 MED ORDER — HEPARIN SOD (PORK) LOCK FLUSH 100 UNIT/ML IV SOLN
500.0000 [IU] | Freq: Once | INTRAVENOUS | Status: AC | PRN
  Administered 2012-05-04: 500 [IU]
  Filled 2012-05-04: qty 5

## 2012-05-04 NOTE — Progress Notes (Signed)
OFFICE PROGRESS NOTE  CC  [facility], MD [address] [place] [address] Dr. Cyndia Bent Dr. Chipper Herb  DIAGNOSIS: 77 year old female with new diagnosis of invasive ductal carcinoma that is ER positive PR positive HER-2/neu positive by [facility] Testing  PRIOR THERAPY:  #1 patient was originally seenBy me on 02/21/2012 when she was diagnosed with invasive ductal carcinoma of the right breast stage II a with a positive lymph node. She underwent a lumpectomy and sentinel lymph node biopsy.The final pathology revealed a 1.8 cm invasive ductal carcinoma with low grade ductal carcinoma in situ the tumor was grade 2 ER positive PR positive Ki-67 was 11%. HER-2/neu was equivocal by cish.  #2 she had a sentinel lymph node biopsy one node was positive for metastatic disease. Making her a stage II A.  #3 patient and I and her family had an extensive discussion at the original meeting and we sent her tumor for HER-2/neu testing by [facility]. By [facility]  HER-2/neu is positive.  #4 patient is now going to be receiving combination chemotherapy consisting of Taxotere carboplatinum and Herceptin Beginning 03/30/2012 She will receive TCH combination every 21 days. She will receive day 2 Neulasta when she receives [facility]. On other weeks she will receive weekly Herceptin. A total of 6 cycles Of TCH is planned. Once she completes this she will then proceed with radiation therapy and we will plan on changing her Herceptin to every 3 weeks to complete out one year of therapy.  CURRENT THERAPY: Cycle 2 day 15 of TCH With day 2 Neulasta, and weekly herceptin  INTERVAL HISTORY: Heather Vega 77 y.o. female returns for followup visit today.  She is doing well.  She has some mild queeziness at night that prevents her from sleeping.  She takes one Ativan tablet and is able to relieve the feeling and fall asleep.  Otherwise she's doing well, keeping up with her fluid intake and is without fevers,  chills, nausea, vomiting or any other concerns.    MEDICAL HISTORY: Past Medical History  Diagnosis Date  . Hyperlipidemia     doesn't require meds  . Asthma     pt states its "not bad"  . Back pain     buldging disc and pt says 2 are "flat"  . Hemorrhoids   . History of colon polyps   . Diverticulosis   . Cancer     breast right  . Hard of hearing   . Diabetes mellitus without complication     takes Metformin daily,Type II  . Allergy   . Cataract     b/l surgery  . Hypothyroidism     hx of;but has been off of meds >52yrs ago  . Wears glasses   . Hypertension     history, was on b/p  meds years ago  . PONV (postoperative nausea and vomiting)     ALLERGIES:   has no known allergies.  MEDICATIONS:  Current Outpatient Prescriptions  Medication Sig Dispense Refill  . aspirin 81 MG tablet Take 81 mg by mouth daily.      . carvedilol (COREG) 3.125 MG tablet Take 1 tablet (3.125 mg total) by mouth 2 (two) times daily with a meal.  60 tablet  3  . CINNAMON PO Take 1 tablet by mouth 2 (two) times daily.       Marland Kitchen dexamethasone (DECADRON) 4 MG tablet Take 2 tablets (8 mg total) by mouth 2 (two) times daily with a meal. Take two times  a day the day before Taxotere. Then take two times a day starting the day after chemo for 3 days.  30 tablet  1  . lidocaine-prilocaine (EMLA) cream Apply topically as needed.  30 g  7  . LORazepam (ATIVAN) 0.5 MG tablet Take 1 tablet (0.5 mg total) by mouth every 6 (six) hours as needed (Nausea or vomiting).  30 tablet  0  . metFORMIN (GLUCOPHAGE) 1000 MG tablet Take 1,000 mg by mouth 2 (two) times daily with a meal.      . Multiple Vitamins-Minerals (MULTIVITAMIN WITH MINERALS) tablet Take 1 tablet by mouth daily.      . naproxen sodium (ANAPROX) 220 MG tablet Take 220 mg by mouth as needed.      . Nutritional Supplements (FIBER FORMULA) POWD Take 30 mLs by mouth daily. tkes 2-3 tablespoons daily for constipation      . ondansetron (ZOFRAN) 8 MG tablet  Take 1 tablet (8 mg total) by mouth 2 (two) times daily. Take two times a day starting the day after chemo for 3 days. Then take two times a day as needed for nausea or vomiting.  30 tablet  1  . prochlorperazine (COMPAZINE) 10 MG tablet Take 1 tablet (10 mg total) by mouth every 6 (six) hours as needed (Nausea or vomiting).  30 tablet  1  . prochlorperazine (COMPAZINE) 25 MG suppository Place 1 suppository (25 mg total) rectally every 12 (twelve) hours as needed for nausea.  12 suppository  3  . Vitamins C E (CRANBERRY CONCENTRATE PO) Take 1 tablet by mouth 2 (two) times daily.        No current facility-administered medications for this visit.   Facility-Administered Medications Ordered in Other Visits  Medication Dose Route Frequency Provider Last Rate Last Dose  . sodium chloride 0.9 % injection 10 mL  10 mL Intracatheter PRN Victorino December, MD   10 mL at 05/04/12 1424    SURGICAL HISTORY:  Past Surgical History  Procedure Date  . Nose surgery 1953  . Tonsillectomy 1955  . Nasal fracture surgery     as a child x 2  . Tonsillectomy   . Breast surgery 70's    right  . Appendectomy   . Bilateral cataract surgery   . Colonoscopy   . Neuroplasty / transposition median nerve at carpal tunnel bilateral   . Cardiac catheterization 20+yrs ago  . Breast lumpectomy with needle localization and axillary sentinel lymph node bx 02/07/2012    Procedure: BREAST LUMPECTOMY WITH NEEDLE LOCALIZATION AND AXILLARY SENTINEL LYMPH NODE BX;  Surgeon: Currie Paris, MD;  Location: [facility] OR;  Service: General;  Laterality: Right;  Right needle loc lumpectomy and Senitel Lymph Node Biopsy   . Abdominal hysterectomy     ovaries intact  . Portacath placement 03/26/2012    Procedure: INSERTION PORT-A-CATH;  Surgeon: Currie Paris, MD;  Location: [facility];  Service: General;  Laterality: Left;    REVIEW OF SYSTEMS:   General: fatigue (-), night sweats (-), fever (-), pain  (-) Lymph: palpable nodes (-) HEENT: vision changes (-), mucositis (-), gum bleeding (-), epistaxis (-) Cardiovascular: chest pain (-), palpitations (-) Pulmonary: shortness of breath (-), dyspnea on exertion (-), cough (-), hemoptysis (-) GI:  Early satiety (-), melena (-), dysphagia (-), nausea/vomiting (-), diarrhea (+) GU: dysuria (-), hematuria (-), incontinence (-) Musculoskeletal: joint swelling (-), joint pain (-), back pain (-) Neuro: weakness (-), numbness (-), headache (-), confusion (-) Skin: Rash (-),  lesions (-), dryness (-) Psych: depression (-), suicidal/homicidal ideation (-), feeling of hopelessness (-)  PHYSICAL EXAMINATION: Blood pressure 134/69, pulse 84, temperature 97.9 F (36.6 C), temperature source Oral, resp. rate 20, height 5\' 5"  (1.651 m), weight 179 lb (81.194 kg). Body mass index is 29.79 kg/(m^2). General: Patient is a well appearing female in no acute distress HEENT: PERRLA, sclerae anicteric no conjunctival pallor, MMM Neck: supple, no palpable adenopathy Lungs: clear to auscultation bilaterally, no wheezes, rhonchi, or rales Cardiovascular: regular rate rhythm, S1, S2, no murmurs, rubs or gallops Abdomen: Soft, non-tender, non-distended, normoactive bowel sounds, no HSM Extremities: warm and well perfused, no clubbing, cyanosis, or edema Skin: No rashes or lesions Neuro: Non-focal Right breast lumpectomy site without nodularity ECOG PERFORMANCE STATUS: 0 - Asymptomatic      LABORATORY DATA: Lab Results  Component Value Date   WBC 15.4* 05/04/2012   HGB 11.7 05/04/2012   HCT 34.6* 05/04/2012   MCV 89.7 05/04/2012   PLT 145 05/04/2012      Chemistry      Component Value Date/Time   NA 138 05/04/2012 1107   NA 138 01/31/2012 1030   K 4.1 05/04/2012 1107   K 4.9 01/31/2012 1030   CL 102 05/04/2012 1107   CL 98 01/31/2012 1030   CO2 27 05/04/2012 1107   CO2 28 01/31/2012 1030   BUN 16.2 05/04/2012 1107   BUN 18 01/31/2012 1030   CREATININE 0.7 05/04/2012 1107    CREATININE 0.78 01/31/2012 1030      Component Value Date/Time   CALCIUM 9.1 05/04/2012 1107   CALCIUM 9.8 01/31/2012 1030   ALKPHOS 138 05/04/2012 1107   ALKPHOS 75 01/31/2012 1030   AST 13 05/04/2012 1107   AST 32 01/31/2012 1030   ALT 25 05/04/2012 1107   ALT 23 01/31/2012 1030   BILITOT 0.47 05/04/2012 1107   BILITOT 0.4 01/31/2012 1030       RADIOGRAPHIC STUDIES:  No results found.  ASSESSMENT: 76 year old female with  #1 stage II A. (T1 C. N1 a  cM0) invasive ductal carcinoma Measuring 1.8 cm ER positive PR positive HER-2/neu positive. One sentinel node was positive for metastatic disease.  #2 patient is a good candidate for adjuvant HER-2-based therapy. We discussed treatment with Taxotere carboplatinum and Herceptin. The Taxotere carboplatinum will be given every 3 weeks with day 2 Neulasta for a total of 6 cycles. The Herceptin will be given weekly for the duration of the chemotherapy and then changed to every 3 weeks to complete one year of Herceptin therapy.  #3 because patient has had a lumpectomy she will also require radiation therapy the patient has been seen by Dr. Ballard Russell regarding this we will refer her back to Dr. Dayton Scrape after she completes her chemotherapy.   PLAN:   #1Ms. Arsenau will proceed with weekly Herceptin today.  She will continue to drink her fluids, her diarrhea has resolved.  I reviewed her labs with her in detail.    #2 We will see her back next week for her next dose of TCH.  All questions were answered. The patient knows to call the clinic with any problems, questions or concerns. We can certainly see the patient much sooner if necessary.  I spent 25 minutes counseling the patient face to face. The total time spent in the appointment was 30 minutes.  Cherie Ouch Lyn Hollingshead, NP Medical Oncology [facility] Phone: [phone]

## 2012-05-04 NOTE — Patient Instructions (Signed)
[facility] Discharge Instructions for Patients Receiving Chemotherapy  Today you received the following chemotherapy agent: Herceptin   To help prevent nausea and vomiting after your treatment, we encourage you to take your nausea medications as needed:  Zofran 8 mg twice daily as needed  Compazine 10 mg every 6 hours as needed  Ativan 0.5 mg every 6 hours as needed Begin taking it at 1st sign of nausea and take it as often as prescribed.   If you develop nausea and vomiting that is not controlled by your nausea medication, call the clinic. If it is after clinic hours your family physician or the after hours number for the clinic or go to the Emergency Department.   BELOW ARE SYMPTOMS THAT SHOULD BE REPORTED IMMEDIATELY:  *FEVER GREATER THAN 100.5 F  *CHILLS WITH OR WITHOUT FEVER  NAUSEA AND VOMITING THAT IS NOT CONTROLLED WITH YOUR NAUSEA MEDICATION  *UNUSUAL SHORTNESS OF BREATH  *UNUSUAL BRUISING OR BLEEDING  TENDERNESS IN MOUTH AND THROAT WITH OR WITHOUT PRESENCE OF ULCERS  *URINARY PROBLEMS  *BOWEL PROBLEMS  UNUSUAL RASH Items with * indicate a potential emergency and should be followed up as soon as possible.   Feel free to call the clinic you have any questions or concerns. The clinic phone number is [phone].   I have been informed and understand all the instructions given to me. I know to contact the clinic, my physician, or go to the Emergency Department if any problems should occur. I do not have any questions at this time, but understand that I may call the clinic during office hours   should I have any questions or need assistance in obtaining follow up care.    __________________________________________  _____________  __________ Signature of Patient or Authorized Representative            Date                   Time    __________________________________________ Nurse's Signature

## 2012-05-04 NOTE — Patient Instructions (Signed)
Doing well.  Proceed with Herceptin.  Please call us if you have any questions or concerns.   We will see you back next week for your full chemotherapy.

## 2012-05-11 ENCOUNTER — Ambulatory Visit (HOSPITAL_BASED_OUTPATIENT_CLINIC_OR_DEPARTMENT_OTHER): Payer: Medicare Other | Admitting: Adult Health

## 2012-05-11 ENCOUNTER — Encounter: Payer: Self-pay | Admitting: Adult Health

## 2012-05-11 ENCOUNTER — Other Ambulatory Visit (HOSPITAL_BASED_OUTPATIENT_CLINIC_OR_DEPARTMENT_OTHER): Payer: Medicare Other | Admitting: Lab

## 2012-05-11 ENCOUNTER — Ambulatory Visit (HOSPITAL_BASED_OUTPATIENT_CLINIC_OR_DEPARTMENT_OTHER): Payer: Medicare Other

## 2012-05-11 ENCOUNTER — Other Ambulatory Visit: Payer: Self-pay | Admitting: Oncology

## 2012-05-11 VITALS — BP 150/75 | HR 99 | Temp 97.7°F | Resp 20 | Ht 65.0 in | Wt 183.6 lb

## 2012-05-11 DIAGNOSIS — C773 Secondary and unspecified malignant neoplasm of axilla and upper limb lymph nodes: Secondary | ICD-10-CM

## 2012-05-11 DIAGNOSIS — Z5111 Encounter for antineoplastic chemotherapy: Secondary | ICD-10-CM

## 2012-05-11 DIAGNOSIS — C50419 Malignant neoplasm of upper-outer quadrant of unspecified female breast: Secondary | ICD-10-CM

## 2012-05-11 DIAGNOSIS — Z5112 Encounter for antineoplastic immunotherapy: Secondary | ICD-10-CM

## 2012-05-11 DIAGNOSIS — C50911 Malignant neoplasm of unspecified site of right female breast: Secondary | ICD-10-CM

## 2012-05-11 DIAGNOSIS — Z170 Estrogen receptor positive status [ER+]: Secondary | ICD-10-CM

## 2012-05-11 LAB — CBC WITH DIFFERENTIAL/PLATELET
BASO%: 0.1 % (ref 0.0–2.0)
Eosinophils Absolute: 0 10*3/uL (ref 0.0–0.5)
HCT: 33 % — ABNORMAL LOW (ref 34.8–46.6)
LYMPH%: 7.8 % — ABNORMAL LOW (ref 14.0–49.7)
MCHC: 33.3 g/dL (ref 31.5–36.0)
MONO#: 0.9 10*3/uL (ref 0.1–0.9)
NEUT%: 87 % — ABNORMAL HIGH (ref 38.4–76.8)
Platelets: 209 10*3/uL (ref 145–400)
WBC: 18.4 10*3/uL — ABNORMAL HIGH (ref 3.9–10.3)

## 2012-05-11 LAB — COMPREHENSIVE METABOLIC PANEL (CC13)
BUN: 20.6 mg/dL (ref 7.0–26.0)
CO2: 25 mEq/L (ref 22–29)
Calcium: 9.6 mg/dL (ref 8.4–10.4)
Chloride: 103 mEq/L (ref 98–107)
Creatinine: 0.7 mg/dL (ref 0.6–1.1)

## 2012-05-11 MED ORDER — SODIUM CHLORIDE 0.9 % IJ SOLN
10.0000 mL | INTRAMUSCULAR | Status: DC | PRN
  Administered 2012-05-11: 10 mL
  Filled 2012-05-11: qty 10

## 2012-05-11 MED ORDER — DOCETAXEL CHEMO INJECTION 160 MG/16ML
75.0000 mg/m2 | Freq: Once | INTRAVENOUS | Status: AC
  Administered 2012-05-11: 150 mg via INTRAVENOUS
  Filled 2012-05-11: qty 15

## 2012-05-11 MED ORDER — ONDANSETRON 16 MG/50ML IVPB (CHCC)
16.0000 mg | Freq: Once | INTRAVENOUS | Status: AC
  Administered 2012-05-11: 16 mg via INTRAVENOUS

## 2012-05-11 MED ORDER — TRASTUZUMAB CHEMO INJECTION 440 MG
2.0000 mg/kg | Freq: Once | INTRAVENOUS | Status: AC
  Administered 2012-05-11: 168 mg via INTRAVENOUS
  Filled 2012-05-11: qty 8

## 2012-05-11 MED ORDER — SODIUM CHLORIDE 0.9 % IV SOLN
652.8000 mg | Freq: Once | INTRAVENOUS | Status: AC
  Administered 2012-05-11: 650 mg via INTRAVENOUS
  Filled 2012-05-11: qty 65

## 2012-05-11 MED ORDER — SODIUM CHLORIDE 0.9 % IV SOLN
Freq: Once | INTRAVENOUS | Status: AC
  Administered 2012-05-11: 13:00:00 via INTRAVENOUS

## 2012-05-11 MED ORDER — DIPHENHYDRAMINE HCL 25 MG PO CAPS
25.0000 mg | ORAL_CAPSULE | Freq: Once | ORAL | Status: AC
  Administered 2012-05-11: 25 mg via ORAL

## 2012-05-11 MED ORDER — ACETAMINOPHEN 325 MG PO TABS
650.0000 mg | ORAL_TABLET | Freq: Once | ORAL | Status: AC
  Administered 2012-05-11: 650 mg via ORAL

## 2012-05-11 MED ORDER — HEPARIN SOD (PORK) LOCK FLUSH 100 UNIT/ML IV SOLN
500.0000 [IU] | Freq: Once | INTRAVENOUS | Status: AC | PRN
  Administered 2012-05-11: 500 [IU]
  Filled 2012-05-11: qty 5

## 2012-05-11 MED ORDER — DEXAMETHASONE SODIUM PHOSPHATE 4 MG/ML IJ SOLN
20.0000 mg | Freq: Once | INTRAMUSCULAR | Status: AC
  Administered 2012-05-11: 20 mg via INTRAVENOUS

## 2012-05-11 NOTE — Patient Instructions (Addendum)
Doing well.  Proceed with chemotherapy.  Please call us if you have any questions or concerns.    

## 2012-05-11 NOTE — Patient Instructions (Signed)
[facility] Discharge Instructions for Patients Receiving Chemotherapy  Today you received the following chemotherapy agents Taxotere, Carboplatin and Herceptin.  To help prevent nausea and vomiting after your treatment, we encourage you to take your nausea medication as ordered per MD.    If you develop nausea and vomiting that is not controlled by your nausea medication, call the clinic. If it is after clinic hours your family physician or the after hours number for the clinic or go to the Emergency Department.   BELOW ARE SYMPTOMS THAT SHOULD BE REPORTED IMMEDIATELY:  *FEVER GREATER THAN 100.5 F  *CHILLS WITH OR WITHOUT FEVER  NAUSEA AND VOMITING THAT IS NOT CONTROLLED WITH YOUR NAUSEA MEDICATION  *UNUSUAL SHORTNESS OF BREATH  *UNUSUAL BRUISING OR BLEEDING  TENDERNESS IN MOUTH AND THROAT WITH OR WITHOUT PRESENCE OF ULCERS  *URINARY PROBLEMS  *BOWEL PROBLEMS  UNUSUAL RASH Items with * indicate a potential emergency and should be followed up as soon as possible.   Please let the nurse know about any problems that you may have experienced. Feel free to call the clinic you have any questions or concerns. The clinic phone number is [phone].   I have been informed and understand all the instructions given to me. I know to contact the clinic, my physician, or go to the Emergency Department if any problems should occur. I do not have any questions at this time, but understand that I may call the clinic during office hours   should I have any questions or need assistance in obtaining follow up care.    __________________________________________  _____________  __________ Signature of Patient or Authorized Representative            Date                   Time    __________________________________________ Nurse's Signature

## 2012-05-11 NOTE — Progress Notes (Signed)
OFFICE PROGRESS NOTE  CC  [facility], MD [address] [place] [address] Dr. Cyndia Bent Dr. Chipper Herb  DIAGNOSIS: 77 year old female with new diagnosis of invasive ductal carcinoma that is ER positive PR positive HER-2/neu positive by hermark Testing  PRIOR THERAPY:  #1 patient was originally seenBy me on 02/21/2012 when she was diagnosed with invasive ductal carcinoma of the right breast stage II a with a positive lymph node. She underwent a lumpectomy and sentinel lymph node biopsy.The final pathology revealed a 1.8 cm invasive ductal carcinoma with low grade ductal carcinoma in situ the tumor was grade 2 ER positive PR positive Ki-67 was 11%. HER-2/neu was equivocal by cish.  #2 she had a sentinel lymph node biopsy one node was positive for metastatic disease. Making her a stage II A.  #3 patient and I and her family had an extensive discussion at the original meeting and we sent her tumor for HER-2/neu testing by [facility]. By hermark  HER-2/neu is positive.  #4 patient is now going to be receiving combination chemotherapy consisting of Taxotere carboplatinum and Herceptin Beginning 03/30/2012 She will receive TCH combination every 21 days. She will receive day 2 Neulasta when she receives [facility]. On other weeks she will receive weekly Herceptin. A total of 6 cycles Of TCH is planned. Once she completes this she will then proceed with radiation therapy and we will plan on changing her Herceptin to every 3 weeks to complete out one year of therapy.  CURRENT THERAPY: Cycle 3 day 1 of TCH With day 2 Neulasta, and weekly herceptin  INTERVAL HISTORY: Heather Vega 77 y.o. female returns for followup visit today.  She is doing well. She has not experienced any more night time queeziness.  She has some mild facial flushing that occurred after taking her Dexamethasone.  Additionally, she is mildly fatigued.  Otherwise, a 10 point ROS is neg.    MEDICAL HISTORY: Past  Medical History  Diagnosis Date  . Hyperlipidemia     doesn't require meds  . Asthma     pt states its "not bad"  . Back pain     buldging disc and pt says 2 are "flat"  . Hemorrhoids   . History of colon polyps   . Diverticulosis   . Cancer     breast right  . Hard of hearing   . Diabetes mellitus without complication     takes Metformin daily,Type II  . Allergy   . Cataract     b/l surgery  . Hypothyroidism     hx of;but has been off of meds >18yrs ago  . Wears glasses   . Hypertension     history, was on b/p  meds years ago  . PONV (postoperative nausea and vomiting)     ALLERGIES:  has No Known Allergies.  MEDICATIONS:  Current Outpatient Prescriptions  Medication Sig Dispense Refill  . aspirin 81 MG tablet Take 81 mg by mouth daily.      . carvedilol (COREG) 3.125 MG tablet Take 1 tablet (3.125 mg total) by mouth 2 (two) times daily with a meal.  60 tablet  3  . CINNAMON PO Take 1 tablet by mouth 2 (two) times daily.       Marland Kitchen dexamethasone (DECADRON) 4 MG tablet Take 2 tablets (8 mg total) by mouth 2 (two) times daily with a meal. Take two times a day the day before Taxotere. Then take two times a day starting the day after  chemo for 3 days.  30 tablet  1  . lidocaine-prilocaine (EMLA) cream Apply topically as needed.  30 g  7  . LORazepam (ATIVAN) 0.5 MG tablet Take 1 tablet (0.5 mg total) by mouth every 6 (six) hours as needed (Nausea or vomiting).  30 tablet  0  . metFORMIN (GLUCOPHAGE) 1000 MG tablet Take 1,000 mg by mouth 2 (two) times daily with a meal.      . Multiple Vitamins-Minerals (MULTIVITAMIN WITH MINERALS) tablet Take 1 tablet by mouth daily.      . naproxen sodium (ANAPROX) 220 MG tablet Take 220 mg by mouth as needed.      . ondansetron (ZOFRAN) 8 MG tablet Take 1 tablet (8 mg total) by mouth 2 (two) times daily. Take two times a day starting the day after chemo for 3 days. Then take two times a day as needed for nausea or vomiting.  30 tablet  1  .  prochlorperazine (COMPAZINE) 10 MG tablet Take 1 tablet (10 mg total) by mouth every 6 (six) hours as needed (Nausea or vomiting).  30 tablet  1  . prochlorperazine (COMPAZINE) 25 MG suppository Place 1 suppository (25 mg total) rectally every 12 (twelve) hours as needed for nausea.  12 suppository  3  . Vitamins C E (CRANBERRY CONCENTRATE PO) Take 1 tablet by mouth 2 (two) times daily.       . Nutritional Supplements (FIBER FORMULA) POWD Take 30 mLs by mouth daily. tkes 2-3 tablespoons daily for constipation       No current facility-administered medications for this visit.    SURGICAL HISTORY:  Past Surgical History  Procedure Laterality Date  . Nose surgery  1953  . Tonsillectomy  1955  . Nasal fracture surgery      as a child x 2  . Tonsillectomy    . Breast surgery  70's    right  . Appendectomy    . Bilateral cataract surgery    . Colonoscopy    . Neuroplasty / transposition median nerve at carpal tunnel bilateral    . Cardiac catheterization  20+yrs ago  . Breast lumpectomy with needle localization and axillary sentinel lymph node bx  02/07/2012    Procedure: BREAST LUMPECTOMY WITH NEEDLE LOCALIZATION AND AXILLARY SENTINEL LYMPH NODE BX;  Surgeon: Currie Paris, MD;  Location: [facility] OR;  Service: General;  Laterality: Right;  Right needle loc lumpectomy and Senitel Lymph Node Biopsy   . Abdominal hysterectomy      ovaries intact  . Portacath placement  03/26/2012    Procedure: INSERTION PORT-A-CATH;  Surgeon: Currie Paris, MD;  Location: [facility];  Service: General;  Laterality: Left;    REVIEW OF SYSTEMS:   General: fatigue (+), night sweats (-), fever (-), pain (-) Lymph: palpable nodes (-) HEENT: vision changes (-), mucositis (-), gum bleeding (-), epistaxis (-) Cardiovascular: chest pain (-), palpitations (-) Pulmonary: shortness of breath (-), dyspnea on exertion (-), cough (-), hemoptysis (-) GI:  Early satiety (-), melena (-), dysphagia  (-), nausea/vomiting (-), diarrhea (-) GU: dysuria (-), hematuria (-), incontinence (-) Musculoskeletal: joint swelling (-), joint pain (-), back pain (-) Neuro: weakness (-), numbness (-), headache (-), confusion (-) Skin: Rash (-), lesions (-), dryness (-) Psych: depression (-), suicidal/homicidal ideation (-), feeling of hopelessness (-)  PHYSICAL EXAMINATION: Blood pressure 150/75, pulse 99, temperature 97.7 F (36.5 C), resp. rate 20, height 5\' 5"  (1.651 m), weight 183 lb 9 oz (83.263 kg). Body mass  index is 30.55 kg/(m^2). General: Patient is a well appearing female in no acute distress HEENT: PERRLA, sclerae anicteric no conjunctival pallor, MMM Neck: supple, no palpable adenopathy Lungs: clear to auscultation bilaterally, no wheezes, rhonchi, or rales Cardiovascular: regular rate rhythm, S1, S2, no murmurs, rubs or gallops Abdomen: Soft, non-tender, non-distended, normoactive bowel sounds, no HSM Extremities: warm and well perfused, no clubbing, cyanosis, or edema Skin: No rashes or lesions Neuro: Non-focal Right breast lumpectomy site without nodularity ECOG PERFORMANCE STATUS: 0 - Asymptomatic      LABORATORY DATA: Lab Results  Component Value Date   WBC 18.4* 05/11/2012   HGB 11.0* 05/11/2012   HCT 33.0* 05/11/2012   MCV 91.2 05/11/2012   PLT 209 05/11/2012      Chemistry      Component Value Date/Time   NA 138 05/04/2012 1107   NA 138 01/31/2012 1030   K 4.1 05/04/2012 1107   K 4.9 01/31/2012 1030   CL 102 05/04/2012 1107   CL 98 01/31/2012 1030   CO2 27 05/04/2012 1107   CO2 28 01/31/2012 1030   BUN 16.2 05/04/2012 1107   BUN 18 01/31/2012 1030   CREATININE 0.7 05/04/2012 1107   CREATININE 0.78 01/31/2012 1030      Component Value Date/Time   CALCIUM 9.1 05/04/2012 1107   CALCIUM 9.8 01/31/2012 1030   ALKPHOS 138 05/04/2012 1107   ALKPHOS 75 01/31/2012 1030   AST 13 05/04/2012 1107   AST 32 01/31/2012 1030   ALT 25 05/04/2012 1107   ALT 23 01/31/2012 1030   BILITOT 0.47 05/04/2012  1107   BILITOT 0.4 01/31/2012 1030       RADIOGRAPHIC STUDIES:  No results found.  ASSESSMENT: 77 year old female with  #1 stage II A. (T1 C. N1 a  cM0) invasive ductal carcinoma Measuring 1.8 cm ER positive PR positive HER-2/neu positive. One sentinel node was positive for metastatic disease.  #2 patient is a good candidate for adjuvant HER-2-based therapy. We discussed treatment with Taxotere carboplatinum and Herceptin. The Taxotere carboplatinum will be given every 3 weeks with day 2 Neulasta for a total of 6 cycles. The Herceptin will be given weekly for the duration of the chemotherapy and then changed to every 3 weeks to complete one year of Herceptin therapy.  #3 because patient has had a lumpectomy she will also require radiation therapy the patient has been seen by Dr. Ballard Russell regarding this we will refer her back to Dr. Dayton Scrape after she completes her chemotherapy.   PLAN:   #1Ms. Arsenau will proceed with her [facility] today.  She is doing well.  Her lab work is stable and I explained it to her in detail.    Her son had a lot of questions regarding the reliability of the hermark testing, and his mother's relapse risk in the future.  I discussed this with him, and offered reassurance.    #2 We will see her back next week for her next dose weekly Herceptin.  All questions were answered. The patient knows to call the clinic with any problems, questions or concerns. We can certainly see the patient much sooner if necessary.  I spent 25 minutes counseling the patient face to face. The total time spent in the appointment was 30 minutes.  Cherie Ouch Lyn Hollingshead, NP Medical Oncology [facility] Phone: [phone]

## 2012-05-12 ENCOUNTER — Ambulatory Visit (HOSPITAL_BASED_OUTPATIENT_CLINIC_OR_DEPARTMENT_OTHER): Payer: Medicare Other

## 2012-05-12 VITALS — BP 150/72 | HR 79 | Temp 98.3°F

## 2012-05-12 DIAGNOSIS — C773 Secondary and unspecified malignant neoplasm of axilla and upper limb lymph nodes: Secondary | ICD-10-CM

## 2012-05-12 DIAGNOSIS — C50419 Malignant neoplasm of upper-outer quadrant of unspecified female breast: Secondary | ICD-10-CM

## 2012-05-12 DIAGNOSIS — C50911 Malignant neoplasm of unspecified site of right female breast: Secondary | ICD-10-CM

## 2012-05-12 MED ORDER — PEGFILGRASTIM INJECTION 6 MG/0.6ML
6.0000 mg | Freq: Once | SUBCUTANEOUS | Status: AC
  Administered 2012-05-12: 6 mg via SUBCUTANEOUS

## 2012-05-13 ENCOUNTER — Other Ambulatory Visit: Payer: Self-pay

## 2012-05-18 ENCOUNTER — Ambulatory Visit (HOSPITAL_BASED_OUTPATIENT_CLINIC_OR_DEPARTMENT_OTHER): Payer: Medicare Other | Admitting: Adult Health

## 2012-05-18 ENCOUNTER — Ambulatory Visit (HOSPITAL_BASED_OUTPATIENT_CLINIC_OR_DEPARTMENT_OTHER): Payer: Medicare Other

## 2012-05-18 ENCOUNTER — Other Ambulatory Visit (HOSPITAL_BASED_OUTPATIENT_CLINIC_OR_DEPARTMENT_OTHER): Payer: Medicare Other | Admitting: Lab

## 2012-05-18 ENCOUNTER — Encounter: Payer: Self-pay | Admitting: Adult Health

## 2012-05-18 VITALS — BP 117/73 | HR 88 | Temp 98.5°F | Resp 20 | Ht 65.0 in | Wt 179.1 lb

## 2012-05-18 LAB — CBC WITH DIFFERENTIAL/PLATELET
BASO%: 0.2 % (ref 0.0–2.0)
Eosinophils Absolute: 0 10*3/uL (ref 0.0–0.5)
MCHC: 33.9 g/dL (ref 31.5–36.0)
MONO#: 1.2 10*3/uL — ABNORMAL HIGH (ref 0.1–0.9)
NEUT#: 8.8 10*3/uL — ABNORMAL HIGH (ref 1.5–6.5)
RBC: 3.74 10*6/uL (ref 3.70–5.45)
RDW: 16.1 % — ABNORMAL HIGH (ref 11.2–14.5)
WBC: 13 10*3/uL — ABNORMAL HIGH (ref 3.9–10.3)
lymph#: 2.9 10*3/uL (ref 0.9–3.3)
nRBC: 0 % (ref 0–0)

## 2012-05-18 LAB — COMPREHENSIVE METABOLIC PANEL (CC13)
Albumin: 3.5 g/dL (ref 3.5–5.0)
Alkaline Phosphatase: 133 U/L (ref 40–150)
BUN: 23 mg/dL (ref 7.0–26.0)
Calcium: 9 mg/dL (ref 8.4–10.4)
Chloride: 102 mEq/L (ref 98–107)
Creatinine: 0.7 mg/dL (ref 0.6–1.1)
Glucose: 103 mg/dl — ABNORMAL HIGH (ref 70–99)
Potassium: 4 mEq/L (ref 3.5–5.1)

## 2012-05-18 MED ORDER — ACETAMINOPHEN 325 MG PO TABS
650.0000 mg | ORAL_TABLET | Freq: Once | ORAL | Status: AC
  Administered 2012-05-18: 650 mg via ORAL

## 2012-05-18 MED ORDER — DIPHENHYDRAMINE HCL 25 MG PO CAPS
25.0000 mg | ORAL_CAPSULE | Freq: Once | ORAL | Status: AC
  Administered 2012-05-18: 25 mg via ORAL

## 2012-05-18 MED ORDER — SODIUM CHLORIDE 0.9 % IV SOLN
Freq: Once | INTRAVENOUS | Status: AC
  Administered 2012-05-18: 14:00:00 via INTRAVENOUS

## 2012-05-18 MED ORDER — HEPARIN SOD (PORK) LOCK FLUSH 100 UNIT/ML IV SOLN
500.0000 [IU] | Freq: Once | INTRAVENOUS | Status: AC | PRN
  Administered 2012-05-18: 500 [IU]
  Filled 2012-05-18: qty 5

## 2012-05-18 MED ORDER — SODIUM CHLORIDE 0.9 % IJ SOLN
10.0000 mL | INTRAMUSCULAR | Status: DC | PRN
  Administered 2012-05-18: 10 mL
  Filled 2012-05-18: qty 10

## 2012-05-18 MED ORDER — TRASTUZUMAB CHEMO INJECTION 440 MG
2.0000 mg/kg | Freq: Once | INTRAVENOUS | Status: AC
  Administered 2012-05-18: 168 mg via INTRAVENOUS
  Filled 2012-05-18: qty 8

## 2012-05-18 NOTE — Patient Instructions (Addendum)
Doing well.  Proceed with Herceptin.  We will give you IV fluids today and you can take aleve, or tylenol if needed for the knee pain.  Please call us if you have any questions or concerns.

## 2012-05-18 NOTE — Patient Instructions (Addendum)
[facility] Discharge Instructions for Patients Receiving Chemotherapy  Today you received the following chemotherapy agents: Herceptin, IV fluids   BELOW ARE SYMPTOMS THAT SHOULD BE REPORTED IMMEDIATELY:  *FEVER GREATER THAN 100.5 F  *CHILLS WITH OR WITHOUT FEVER  NAUSEA AND VOMITING THAT IS NOT CONTROLLED WITH YOUR NAUSEA MEDICATION  *UNUSUAL SHORTNESS OF BREATH  *UNUSUAL BRUISING OR BLEEDING  TENDERNESS IN MOUTH AND THROAT WITH OR WITHOUT PRESENCE OF ULCERS  *URINARY PROBLEMS  *BOWEL PROBLEMS  UNUSUAL RASH Items with * indicate a potential emergency and should be followed up as soon as possible.   Feel free to call the clinic you have any questions or concerns. The clinic phone number is [phone].

## 2012-05-18 NOTE — Progress Notes (Signed)
OFFICE PROGRESS NOTE  CC  [facility], MD [address] [place] [address] Dr. Cyndia Bent Dr. Chipper Herb  DIAGNOSIS: 77 year old female with new diagnosis of invasive ductal carcinoma that is ER positive PR positive HER-2/neu positive by [facility] Testing  PRIOR THERAPY:  #1 patient was originally seenBy me on 02/21/2012 when she was diagnosed with invasive ductal carcinoma of the right breast stage II a with a positive lymph node. She underwent a lumpectomy and sentinel lymph node biopsy.The final pathology revealed a 1.8 cm invasive ductal carcinoma with low grade ductal carcinoma in situ the tumor was grade 2 ER positive PR positive Ki-67 was 11%. HER-2/neu was equivocal by cish.  #2 she had a sentinel lymph node biopsy one node was positive for metastatic disease. Making her a stage II A.  #3 patient and I and her family had an extensive discussion at the original meeting and we sent her tumor for HER-2/neu testing by [facility]. By [facility]  HER-2/neu is positive.  #4 patient is now going to be receiving combination chemotherapy consisting of Taxotere carboplatinum and Herceptin Beginning 03/30/2012 She will receive TCH combination every 21 days. She will receive day 2 Neulasta when she receives [facility]. On other weeks she will receive weekly Herceptin. A total of 6 cycles Of TCH is planned. Once she completes this she will then proceed with radiation therapy and we will plan on changing her Herceptin to every 3 weeks to complete out one year of therapy.  CURRENT THERAPY: Cycle 3 day 8 of TCH With day 2 Neulasta, and weekly herceptin  INTERVAL HISTORY: Heather Vega 77 y.o. female returns for followup visit today.  She tolerated cycle 3 moderately well.  She did start to experience knee pain and constipation with this cycle.  She took a stool softener and was able to relieve her constipation, but now has 3-4 loose bowel movements per day.  She's otherwise feeling well,  she denies fevers, chills, numbness, or any other concerns.  She's ready to proceed with Herceptin today.   MEDICAL HISTORY: Past Medical History  Diagnosis Date  . Hyperlipidemia     doesn't require meds  . Asthma     pt states its "not bad"  . Back pain     buldging disc and pt says 2 are "flat"  . Hemorrhoids   . History of colon polyps   . Diverticulosis   . Cancer     breast right  . Hard of hearing   . Diabetes mellitus without complication     takes Metformin daily,Type II  . Allergy   . Cataract     b/l surgery  . Hypothyroidism     hx of;but has been off of meds >75yrs ago  . Wears glasses   . Hypertension     history, was on b/p  meds years ago  . PONV (postoperative nausea and vomiting)     ALLERGIES:  has No Known Allergies.  MEDICATIONS:  Current Outpatient Prescriptions  Medication Sig Dispense Refill  . aspirin 81 MG tablet Take 81 mg by mouth daily.      . carvedilol (COREG) 3.125 MG tablet Take 1 tablet (3.125 mg total) by mouth 2 (two) times daily with a meal.  60 tablet  3  . CINNAMON PO Take 1 tablet by mouth 2 (two) times daily.       Marland Kitchen dexamethasone (DECADRON) 4 MG tablet Take 2 tablets (8 mg total) by mouth 2 (two) times daily  with a meal. Take two times a day the day before Taxotere. Then take two times a day starting the day after chemo for 3 days.  30 tablet  1  . lidocaine-prilocaine (EMLA) cream Apply topically as needed.  30 g  7  . LORazepam (ATIVAN) 0.5 MG tablet Take 1 tablet (0.5 mg total) by mouth every 6 (six) hours as needed (Nausea or vomiting).  30 tablet  0  . metFORMIN (GLUCOPHAGE) 1000 MG tablet Take 1,000 mg by mouth 2 (two) times daily with a meal.      . Multiple Vitamins-Minerals (MULTIVITAMIN WITH MINERALS) tablet Take 1 tablet by mouth daily.      . naproxen sodium (ANAPROX) 220 MG tablet Take 220 mg by mouth as needed.      . Nutritional Supplements (FIBER FORMULA) POWD Take 30 mLs by mouth daily. tkes 2-3 tablespoons daily  for constipation      . ondansetron (ZOFRAN) 8 MG tablet Take 1 tablet (8 mg total) by mouth 2 (two) times daily. Take two times a day starting the day after chemo for 3 days. Then take two times a day as needed for nausea or vomiting.  30 tablet  1  . prochlorperazine (COMPAZINE) 10 MG tablet Take 1 tablet (10 mg total) by mouth every 6 (six) hours as needed (Nausea or vomiting).  30 tablet  1  . prochlorperazine (COMPAZINE) 25 MG suppository Place 1 suppository (25 mg total) rectally every 12 (twelve) hours as needed for nausea.  12 suppository  3  . Vitamins C E (CRANBERRY CONCENTRATE PO) Take 1 tablet by mouth 2 (two) times daily.        No current facility-administered medications for this visit.   Facility-Administered Medications Ordered in Other Visits  Medication Dose Route Frequency Provider Last Rate Last Dose  . heparin lock flush 100 unit/mL  500 Units Intracatheter Once PRN Victorino December, MD      . sodium chloride 0.9 % injection 10 mL  10 mL Intracatheter PRN Victorino December, MD        SURGICAL HISTORY:  Past Surgical History  Procedure Laterality Date  . Nose surgery  1953  . Tonsillectomy  1955  . Nasal fracture surgery      as a child x 2  . Tonsillectomy    . Breast surgery  70's    right  . Appendectomy    . Bilateral cataract surgery    . Colonoscopy    . Neuroplasty / transposition median nerve at carpal tunnel bilateral    . Cardiac catheterization  20+yrs ago  . Breast lumpectomy with needle localization and axillary sentinel lymph node bx  02/07/2012    Procedure: BREAST LUMPECTOMY WITH NEEDLE LOCALIZATION AND AXILLARY SENTINEL LYMPH NODE BX;  Surgeon: Currie Paris, MD;  Location: [facility] OR;  Service: General;  Laterality: Right;  Right needle loc lumpectomy and Senitel Lymph Node Biopsy   . Abdominal hysterectomy      ovaries intact  . Portacath placement  03/26/2012    Procedure: INSERTION PORT-A-CATH;  Surgeon: Currie Paris, MD;  Location: [facility];  Service: General;  Laterality: Left;    REVIEW OF SYSTEMS:   General: fatigue (+), night sweats (-), fever (-), pain (-) Lymph: palpable nodes (-) HEENT: vision changes (-), mucositis (-), gum bleeding (-), epistaxis (-) Cardiovascular: chest pain (-), palpitations (-) Pulmonary: shortness of breath (-), dyspnea on exertion (-), cough (-), hemoptysis (-) GI:  Early  satiety (-), melena (-), dysphagia (-), nausea/vomiting (-), diarrhea (-) GU: dysuria (-), hematuria (-), incontinence (-) Musculoskeletal: joint swelling (-), joint pain (+), back pain (-) Neuro: weakness (-), numbness (-), headache (-), confusion (-) Skin: Rash (-), lesions (-), dryness (-) Psych: depression (-), suicidal/homicidal ideation (-), feeling of hopelessness (-)  PHYSICAL EXAMINATION: Blood pressure 117/73, pulse 88, temperature 98.5 F (36.9 C), temperature source Oral, resp. rate 20, height 5\' 5"  (1.651 m), weight 179 lb 1.6 oz (81.239 kg). Body mass index is 29.8 kg/(m^2). General: Patient is a well appearing female in no acute distress HEENT: PERRLA, sclerae anicteric no conjunctival pallor, MMM Neck: supple, no palpable adenopathy Lungs: clear to auscultation bilaterally, no wheezes, rhonchi, or rales Cardiovascular: regular rate rhythm, S1, S2, no murmurs, rubs or gallops Abdomen: Soft, non-tender, non-distended, normoactive bowel sounds, no HSM Extremities: warm and well perfused, no clubbing, cyanosis, or edema Skin: No rashes or lesions Neuro: Non-focal Right breast lumpectomy site without nodularity ECOG PERFORMANCE STATUS: 0 - Asymptomatic      LABORATORY DATA: Lab Results  Component Value Date   WBC 13.0* 05/18/2012   HGB 11.5* 05/18/2012   HCT 33.9* 05/18/2012   MCV 90.6 05/18/2012   PLT 322 05/18/2012      Chemistry      Component Value Date/Time   NA 137 05/18/2012 1049   NA 138 01/31/2012 1030   K 4.0 05/18/2012 1049   K 4.9 01/31/2012 1030   CL 102 05/18/2012 1049    CL 98 01/31/2012 1030   CO2 27 05/18/2012 1049   CO2 28 01/31/2012 1030   BUN 23.0 05/18/2012 1049   BUN 18 01/31/2012 1030   CREATININE 0.7 05/18/2012 1049   CREATININE 0.78 01/31/2012 1030      Component Value Date/Time   CALCIUM 9.0 05/18/2012 1049   CALCIUM 9.8 01/31/2012 1030   ALKPHOS 133 05/18/2012 1049   ALKPHOS 75 01/31/2012 1030   AST 18 05/18/2012 1049   AST 32 01/31/2012 1030   ALT 38 05/18/2012 1049   ALT 23 01/31/2012 1030   BILITOT 0.36 05/18/2012 1049   BILITOT 0.4 01/31/2012 1030       RADIOGRAPHIC STUDIES:  No results found.  ASSESSMENT: 77 year old female with  #1 stage II A. (T1 C. N1 a  cM0) invasive ductal carcinoma Measuring 1.8 cm ER positive PR positive HER-2/neu positive. One sentinel node was positive for metastatic disease.  #2 patient is a good candidate for adjuvant HER-2-based therapy. We discussed treatment with Taxotere carboplatinum and Herceptin. The Taxotere carboplatinum will be given every 3 weeks with day 2 Neulasta for a total of 6 cycles. The Herceptin will be given weekly for the duration of the chemotherapy and then changed to every 3 weeks to complete one year of Herceptin therapy.  #3 because patient has had a lumpectomy she will also require radiation therapy the patient has been seen by Dr. Ballard Russell regarding this we will refer her back to Dr. Dayton Scrape after she completes her chemotherapy.   PLAN:   #1Ms. Vega will proceed with Herceptin today.  I instructed her that she could take Aleve or Tylenol if needed for the pain in her knees as it was likely due to the Neulasta.  She will also receive IV fluids today with herceptin.    #2 We will see her back next week for her next dose weekly Herceptin.  All questions were answered. The patient knows to call the clinic with any problems, questions or concerns.  We can certainly see the patient much sooner if necessary.  I spent 25 minutes counseling the patient face to face. The total time spent  in the appointment was 30 minutes.  Cherie Ouch Lyn Hollingshead, NP Medical Oncology [facility] Phone: [phone]

## 2012-05-22 ENCOUNTER — Encounter: Payer: Self-pay | Admitting: *Deleted

## 2012-05-25 ENCOUNTER — Other Ambulatory Visit (HOSPITAL_BASED_OUTPATIENT_CLINIC_OR_DEPARTMENT_OTHER): Payer: Medicare Other | Admitting: Lab

## 2012-05-25 ENCOUNTER — Encounter: Payer: Self-pay | Admitting: Adult Health

## 2012-05-25 ENCOUNTER — Ambulatory Visit (HOSPITAL_BASED_OUTPATIENT_CLINIC_OR_DEPARTMENT_OTHER): Payer: Medicare Other | Admitting: Adult Health

## 2012-05-25 ENCOUNTER — Ambulatory Visit (HOSPITAL_BASED_OUTPATIENT_CLINIC_OR_DEPARTMENT_OTHER): Payer: Medicare Other

## 2012-05-25 VITALS — BP 145/81 | HR 88 | Temp 98.6°F | Resp 20 | Ht 65.0 in | Wt 183.2 lb

## 2012-05-25 DIAGNOSIS — C50419 Malignant neoplasm of upper-outer quadrant of unspecified female breast: Secondary | ICD-10-CM

## 2012-05-25 LAB — COMPREHENSIVE METABOLIC PANEL (CC13)
ALT: 22 U/L (ref 0–55)
Albumin: 3.2 g/dL — ABNORMAL LOW (ref 3.5–5.0)
Alkaline Phosphatase: 124 U/L (ref 40–150)
CO2: 24 mEq/L (ref 22–29)
Glucose: 119 mg/dl — ABNORMAL HIGH (ref 70–99)
Potassium: 4.2 mEq/L (ref 3.5–5.1)
Sodium: 138 mEq/L (ref 136–145)
Total Bilirubin: 0.44 mg/dL (ref 0.20–1.20)
Total Protein: 6.2 g/dL — ABNORMAL LOW (ref 6.4–8.3)

## 2012-05-25 LAB — CBC WITH DIFFERENTIAL/PLATELET
Basophils Absolute: 0 10*3/uL (ref 0.0–0.1)
Eosinophils Absolute: 0 10*3/uL (ref 0.0–0.5)
HCT: 32.6 % — ABNORMAL LOW (ref 34.8–46.6)
HGB: 11 g/dL — ABNORMAL LOW (ref 11.6–15.9)
NEUT#: 10.6 10*3/uL — ABNORMAL HIGH (ref 1.5–6.5)
RDW: 17.4 % — ABNORMAL HIGH (ref 11.2–14.5)
lymph#: 2 10*3/uL (ref 0.9–3.3)

## 2012-05-25 MED ORDER — SODIUM CHLORIDE 0.9 % IJ SOLN
10.0000 mL | INTRAMUSCULAR | Status: DC | PRN
  Administered 2012-05-25: 10 mL
  Filled 2012-05-25: qty 10

## 2012-05-25 MED ORDER — HEPARIN SOD (PORK) LOCK FLUSH 100 UNIT/ML IV SOLN
500.0000 [IU] | Freq: Once | INTRAVENOUS | Status: AC | PRN
  Administered 2012-05-25: 500 [IU]
  Filled 2012-05-25: qty 5

## 2012-05-25 MED ORDER — ACETAMINOPHEN 325 MG PO TABS
650.0000 mg | ORAL_TABLET | Freq: Once | ORAL | Status: AC
  Administered 2012-05-25: 650 mg via ORAL

## 2012-05-25 MED ORDER — TRASTUZUMAB CHEMO INJECTION 440 MG
2.0000 mg/kg | Freq: Once | INTRAVENOUS | Status: AC
  Administered 2012-05-25: 168 mg via INTRAVENOUS
  Filled 2012-05-25: qty 8

## 2012-05-25 MED ORDER — DIPHENHYDRAMINE HCL 25 MG PO CAPS
25.0000 mg | ORAL_CAPSULE | Freq: Once | ORAL | Status: AC
  Administered 2012-05-25: 25 mg via ORAL

## 2012-05-25 MED ORDER — SODIUM CHLORIDE 0.9 % IV SOLN
Freq: Once | INTRAVENOUS | Status: AC
  Administered 2012-05-25: 12:00:00 via INTRAVENOUS

## 2012-05-25 NOTE — Patient Instructions (Addendum)
Doing well.  Proceed with Herceptin.  We will see you back next week for your chemotherapy.  Please call us if you have any questions or concerns.

## 2012-05-25 NOTE — Patient Instructions (Signed)
[facility] Discharge Instructions for Patients Receiving Chemotherapy  Today you received the following chemotherapy agents Herceptin.  To help prevent nausea and vomiting after your treatment, we encourage you to take your nausea medication.   If you develop nausea and vomiting that is not controlled by your nausea medication, call the clinic. If it is after clinic hours your family physician or the after hours number for the clinic or go to the Emergency Department.   BELOW ARE SYMPTOMS THAT SHOULD BE REPORTED IMMEDIATELY:  *FEVER GREATER THAN 100.5 F  *CHILLS WITH OR WITHOUT FEVER  NAUSEA AND VOMITING THAT IS NOT CONTROLLED WITH YOUR NAUSEA MEDICATION  *UNUSUAL SHORTNESS OF BREATH  *UNUSUAL BRUISING OR BLEEDING  TENDERNESS IN MOUTH AND THROAT WITH OR WITHOUT PRESENCE OF ULCERS  *URINARY PROBLEMS  *BOWEL PROBLEMS  UNUSUAL RASH Items with * indicate a potential emergency and should be followed up as soon as possible.  One of the nurses will contact you 24 hours after your treatment. Please let the nurse know about any problems that you may have experienced. Feel free to call the clinic you have any questions or concerns. The clinic phone number is [phone].   I have been informed and understand all the instructions given to me. I know to contact the clinic, my physician, or go to the Emergency Department if any problems should occur. I do not have any questions at this time, but understand that I may call the clinic during office hours   should I have any questions or need assistance in obtaining follow up care.    __________________________________________  _____________  __________ Signature of Patient or Authorized Representative            Date                   Time    __________________________________________ Nurse's Signature    

## 2012-05-25 NOTE — Progress Notes (Signed)
OFFICE PROGRESS NOTE  CC  [facility], MD [address] [place] [address] Dr. Cyndia Bent Dr. Chipper Herb  DIAGNOSIS: 77 year old female with new diagnosis of invasive ductal carcinoma that is ER positive PR positive HER-2/neu positive by [facility] Testing  PRIOR THERAPY:  #1 patient was originally seenBy me on 02/21/2012 when she was diagnosed with invasive ductal carcinoma of the right breast stage II a with a positive lymph node. She underwent a lumpectomy and sentinel lymph node biopsy.The final pathology revealed a 1.8 cm invasive ductal carcinoma with low grade ductal carcinoma in situ the tumor was grade 2 ER positive PR positive Ki-67 was 11%. HER-2/neu was equivocal by cish.  #2 she had a sentinel lymph node biopsy one node was positive for metastatic disease. Making her a stage II A.  #3 patient and I and her family had an extensive discussion at the original meeting and we sent her tumor for HER-2/neu testing by [facility]. By [facility]  HER-2/neu is positive.  #4 patient is now going to be receiving combination chemotherapy consisting of Taxotere carboplatinum and Herceptin Beginning 03/30/2012 She will receive TCH combination every 21 days. She will receive day 2 Neulasta when she receives [facility]. On other weeks she will receive weekly Herceptin. A total of 6 cycles Of TCH is planned. Once she completes this she will then proceed with radiation therapy and we will plan on changing her Herceptin to every 3 weeks to complete out one year of therapy.  CURRENT THERAPY: Cycle 3 day 15 of TCH With day 2 Neulasta, and weekly herceptin  INTERVAL HISTORY: Heather Vega 77 y.o. female returns for followup visit today. She's doing well today.  She was pretty tired earlier this week, and ate some beef and it helped.  She is drinking 1 boost per day and enjoying it.  She did have an episode of nausea, vomiting, and diarrhea x 24 hours that she thinks is related to eating at a  [employer].   It has resolved and she is feeling better.  She denies fevers, chills, further n/v/d, constipation, numbness/tingling, or any other concerns.    MEDICAL HISTORY: Past Medical History  Diagnosis Date  . Hyperlipidemia     doesn't require meds  . Asthma     pt states its "not bad"  . Back pain     buldging disc and pt says 2 are "flat"  . Hemorrhoids   . History of colon polyps   . Diverticulosis   . Cancer     breast right  . Hard of hearing   . Diabetes mellitus without complication     takes Metformin daily,Type II  . Allergy   . Cataract     b/l surgery  . Hypothyroidism     hx of;but has been off of meds >58yrs ago  . Wears glasses   . Hypertension     history, was on b/p  meds years ago  . PONV (postoperative nausea and vomiting)     ALLERGIES:  has No Known Allergies.  MEDICATIONS:  Current Outpatient Prescriptions  Medication Sig Dispense Refill  . aspirin 81 MG tablet Take 81 mg by mouth daily.      . carvedilol (COREG) 3.125 MG tablet Take 1 tablet (3.125 mg total) by mouth 2 (two) times daily with a meal.  60 tablet  3  . CINNAMON PO Take 1 tablet by mouth 2 (two) times daily.       Marland Kitchen dexamethasone (DECADRON)  4 MG tablet Take 2 tablets (8 mg total) by mouth 2 (two) times daily with a meal. Take two times a day the day before Taxotere. Then take two times a day starting the day after chemo for 3 days.  30 tablet  1  . lidocaine-prilocaine (EMLA) cream Apply topically as needed.  30 g  7  . LORazepam (ATIVAN) 0.5 MG tablet Take 1 tablet (0.5 mg total) by mouth every 6 (six) hours as needed (Nausea or vomiting).  30 tablet  0  . metFORMIN (GLUCOPHAGE) 1000 MG tablet Take 1,000 mg by mouth 2 (two) times daily with a meal.      . Multiple Vitamins-Minerals (MULTIVITAMIN WITH MINERALS) tablet Take 1 tablet by mouth daily.      . naproxen sodium (ANAPROX) 220 MG tablet Take 220 mg by mouth as needed.      . Nutritional Supplements (FIBER FORMULA)  POWD Take 30 mLs by mouth daily. tkes 2-3 tablespoons daily for constipation      . ondansetron (ZOFRAN) 8 MG tablet Take 1 tablet (8 mg total) by mouth 2 (two) times daily. Take two times a day starting the day after chemo for 3 days. Then take two times a day as needed for nausea or vomiting.  30 tablet  1  . prochlorperazine (COMPAZINE) 10 MG tablet Take 1 tablet (10 mg total) by mouth every 6 (six) hours as needed (Nausea or vomiting).  30 tablet  1  . prochlorperazine (COMPAZINE) 25 MG suppository Place 1 suppository (25 mg total) rectally every 12 (twelve) hours as needed for nausea.  12 suppository  3  . Vitamins C E (CRANBERRY CONCENTRATE PO) Take 1 tablet by mouth 2 (two) times daily.        No current facility-administered medications for this visit.   Facility-Administered Medications Ordered in Other Visits  Medication Dose Route Frequency Provider Last Rate Last Dose  . heparin lock flush 100 unit/mL  500 Units Intracatheter Once PRN Victorino December, MD      . sodium chloride 0.9 % injection 10 mL  10 mL Intracatheter PRN Victorino December, MD      . trastuzumab (HERCEPTIN) 168 mg in sodium chloride 0.9 % 250 mL chemo infusion  2 mg/kg (Treatment Plan Actual) Intravenous Once Victorino December, MD        SURGICAL HISTORY:  Past Surgical History  Procedure Laterality Date  . Nose surgery  1953  . Tonsillectomy  1955  . Nasal fracture surgery      as a child x 2  . Tonsillectomy    . Breast surgery  70's    right  . Appendectomy    . Bilateral cataract surgery    . Colonoscopy    . Neuroplasty / transposition median nerve at carpal tunnel bilateral    . Cardiac catheterization  20+yrs ago  . Breast lumpectomy with needle localization and axillary sentinel lymph node bx  02/07/2012    Procedure: BREAST LUMPECTOMY WITH NEEDLE LOCALIZATION AND AXILLARY SENTINEL LYMPH NODE BX;  Surgeon: Currie Paris, MD;  Location: [facility] OR;  Service: General;  Laterality: Right;  Right needle loc  lumpectomy and Senitel Lymph Node Biopsy   . Abdominal hysterectomy      ovaries intact  . Portacath placement  03/26/2012    Procedure: INSERTION PORT-A-CATH;  Surgeon: Currie Paris, MD;  Location: [facility];  Service: General;  Laterality: Left;    REVIEW OF SYSTEMS:   General:  fatigue (+), night sweats (-), fever (-), pain (-) Lymph: palpable nodes (-) HEENT: vision changes (-), mucositis (-), gum bleeding (-), epistaxis (-) Cardiovascular: chest pain (-), palpitations (-) Pulmonary: shortness of breath (-), dyspnea on exertion (-), cough (-), hemoptysis (-) GI:  Early satiety (-), melena (-), dysphagia (-), nausea/vomiting (-), diarrhea (-) GU: dysuria (-), hematuria (-), incontinence (-) Musculoskeletal: joint swelling (-), joint pain (+), back pain (-) Neuro: weakness (-), numbness (-), headache (-), confusion (-) Skin: Rash (-), lesions (-), dryness (-) Psych: depression (-), suicidal/homicidal ideation (-), feeling of hopelessness (-)  PHYSICAL EXAMINATION: Blood pressure 145/81, pulse 88, temperature 98.6 F (37 C), temperature source Oral, resp. rate 20, height 5\' 5"  (1.651 m), weight 183 lb 3.2 oz (83.099 kg). Body mass index is 30.49 kg/(m^2). General: Patient is a well appearing female in no acute distress HEENT: PERRLA, sclerae anicteric no conjunctival pallor, MMM Neck: supple, no palpable adenopathy Lungs: clear to auscultation bilaterally, no wheezes, rhonchi, or rales Cardiovascular: regular rate rhythm, S1, S2, no murmurs, rubs or gallops Abdomen: Soft, non-tender, non-distended, normoactive bowel sounds, no HSM Extremities: warm and well perfused, no clubbing, cyanosis, or edema Skin: No rashes or lesions Neuro: Non-focal Right breast lumpectomy site without nodularity ECOG PERFORMANCE STATUS: 0 - Asymptomatic      LABORATORY DATA: Lab Results  Component Value Date   WBC 13.3* 05/25/2012   HGB 11.0* 05/25/2012   HCT 32.6* 05/25/2012    MCV 92.1 05/25/2012   PLT 161 05/25/2012      Chemistry      Component Value Date/Time   NA 137 05/18/2012 1049   NA 138 01/31/2012 1030   K 4.0 05/18/2012 1049   K 4.9 01/31/2012 1030   CL 102 05/18/2012 1049   CL 98 01/31/2012 1030   CO2 27 05/18/2012 1049   CO2 28 01/31/2012 1030   BUN 23.0 05/18/2012 1049   BUN 18 01/31/2012 1030   CREATININE 0.7 05/18/2012 1049   CREATININE 0.78 01/31/2012 1030      Component Value Date/Time   CALCIUM 9.0 05/18/2012 1049   CALCIUM 9.8 01/31/2012 1030   ALKPHOS 133 05/18/2012 1049   ALKPHOS 75 01/31/2012 1030   AST 18 05/18/2012 1049   AST 32 01/31/2012 1030   ALT 38 05/18/2012 1049   ALT 23 01/31/2012 1030   BILITOT 0.36 05/18/2012 1049   BILITOT 0.4 01/31/2012 1030       RADIOGRAPHIC STUDIES:  No results found.  ASSESSMENT: 76 year old female with  #1 stage II A. (T1 C. N1 a  cM0) invasive ductal carcinoma Measuring 1.8 cm ER positive PR positive HER-2/neu positive. One sentinel node was positive for metastatic disease.  #2 patient is a good candidate for adjuvant HER-2-based therapy. We discussed treatment with Taxotere carboplatinum and Herceptin. The Taxotere carboplatinum will be given every 3 weeks with day 2 Neulasta for a total of 6 cycles. The Herceptin will be given weekly for the duration of the chemotherapy and then changed to every 3 weeks to complete one year of Herceptin therapy.  #3 because patient has had a lumpectomy she will also require radiation therapy the patient has been seen by Dr. Ballard Russell regarding this we will refer her back to Dr. Dayton Scrape after she completes her chemotherapy.   PLAN:   #1Ms. Arsenau will proceed with Herceptin today.  She is feeling well today.  I wished her happy birthday next week.    #2 We will see her back next week for  her next chemotherapy cycle.   All questions were answered. The patient knows to call the clinic with any problems, questions or concerns. We can certainly see the patient much  sooner if necessary.  I spent 15 minutes counseling the patient face to face. The total time spent in the appointment was 30 minutes.  Cherie Ouch Lyn Hollingshead, NP Medical Oncology [facility] Phone: [phone]

## 2012-06-01 ENCOUNTER — Ambulatory Visit (HOSPITAL_BASED_OUTPATIENT_CLINIC_OR_DEPARTMENT_OTHER): Payer: Medicare Other | Admitting: Oncology

## 2012-06-01 ENCOUNTER — Ambulatory Visit (HOSPITAL_BASED_OUTPATIENT_CLINIC_OR_DEPARTMENT_OTHER): Payer: Medicare Other

## 2012-06-01 ENCOUNTER — Encounter: Payer: Self-pay | Admitting: Oncology

## 2012-06-01 ENCOUNTER — Other Ambulatory Visit: Payer: Self-pay | Admitting: Oncology

## 2012-06-01 ENCOUNTER — Other Ambulatory Visit: Payer: Medicare Other | Admitting: Lab

## 2012-06-01 VITALS — BP 147/81 | HR 89 | Temp 97.5°F | Wt 183.9 lb

## 2012-06-01 DIAGNOSIS — C50419 Malignant neoplasm of upper-outer quadrant of unspecified female breast: Secondary | ICD-10-CM

## 2012-06-01 DIAGNOSIS — C773 Secondary and unspecified malignant neoplasm of axilla and upper limb lymph nodes: Secondary | ICD-10-CM

## 2012-06-01 DIAGNOSIS — Z170 Estrogen receptor positive status [ER+]: Secondary | ICD-10-CM

## 2012-06-01 DIAGNOSIS — Z5112 Encounter for antineoplastic immunotherapy: Secondary | ICD-10-CM

## 2012-06-01 LAB — CBC WITH DIFFERENTIAL/PLATELET
BASO%: 0.1 % (ref 0.0–2.0)
EOS%: 0 % (ref 0.0–7.0)
LYMPH%: 7.7 % — ABNORMAL LOW (ref 14.0–49.7)
MCH: 31.2 pg (ref 25.1–34.0)
MCHC: 33.3 g/dL (ref 31.5–36.0)
MCV: 93.5 fL (ref 79.5–101.0)
MONO%: 6.3 % (ref 0.0–14.0)
NEUT#: 14.6 10*3/uL — ABNORMAL HIGH (ref 1.5–6.5)
Platelets: 193 10*3/uL (ref 145–400)
RBC: 3.21 10*6/uL — ABNORMAL LOW (ref 3.70–5.45)
RDW: 18.3 % — ABNORMAL HIGH (ref 11.2–14.5)
nRBC: 0 % (ref 0–0)

## 2012-06-01 LAB — COMPREHENSIVE METABOLIC PANEL (CC13)
ALT: 25 U/L (ref 0–55)
Alkaline Phosphatase: 98 U/L (ref 40–150)
CO2: 23 mEq/L (ref 22–29)
Creatinine: 0.7 mg/dL (ref 0.6–1.1)
Sodium: 140 mEq/L (ref 136–145)
Total Bilirubin: 0.35 mg/dL (ref 0.20–1.20)
Total Protein: 6.7 g/dL (ref 6.4–8.3)

## 2012-06-01 MED ORDER — ACETAMINOPHEN 325 MG PO TABS
650.0000 mg | ORAL_TABLET | Freq: Once | ORAL | Status: AC
  Administered 2012-06-01: 650 mg via ORAL

## 2012-06-01 MED ORDER — HEPARIN SOD (PORK) LOCK FLUSH 100 UNIT/ML IV SOLN
500.0000 [IU] | Freq: Once | INTRAVENOUS | Status: AC | PRN
  Administered 2012-06-01: 500 [IU]
  Filled 2012-06-01: qty 5

## 2012-06-01 MED ORDER — DIPHENHYDRAMINE HCL 25 MG PO CAPS
25.0000 mg | ORAL_CAPSULE | Freq: Once | ORAL | Status: AC
  Administered 2012-06-01: 25 mg via ORAL

## 2012-06-01 MED ORDER — SODIUM CHLORIDE 0.9 % IV SOLN
652.8000 mg | Freq: Once | INTRAVENOUS | Status: AC
  Administered 2012-06-01: 650 mg via INTRAVENOUS
  Filled 2012-06-01: qty 65

## 2012-06-01 MED ORDER — DOCETAXEL CHEMO INJECTION 160 MG/16ML
75.0000 mg/m2 | Freq: Once | INTRAVENOUS | Status: AC
  Administered 2012-06-01: 150 mg via INTRAVENOUS
  Filled 2012-06-01: qty 15

## 2012-06-01 MED ORDER — TRASTUZUMAB CHEMO INJECTION 440 MG
2.0000 mg/kg | Freq: Once | INTRAVENOUS | Status: AC
  Administered 2012-06-01: 168 mg via INTRAVENOUS
  Filled 2012-06-01: qty 8

## 2012-06-01 MED ORDER — DEXAMETHASONE SODIUM PHOSPHATE 4 MG/ML IJ SOLN
20.0000 mg | Freq: Once | INTRAMUSCULAR | Status: AC
  Administered 2012-06-01: 20 mg via INTRAVENOUS

## 2012-06-01 MED ORDER — ONDANSETRON 16 MG/50ML IVPB (CHCC)
16.0000 mg | Freq: Once | INTRAVENOUS | Status: AC
  Administered 2012-06-01: 16 mg via INTRAVENOUS

## 2012-06-01 MED ORDER — SODIUM CHLORIDE 0.9 % IV SOLN
Freq: Once | INTRAVENOUS | Status: AC
  Administered 2012-06-01: 12:00:00 via INTRAVENOUS

## 2012-06-01 MED ORDER — DEXAMETHASONE 4 MG PO TABS: 8.0000 mg | ORAL_TABLET | Freq: Two times a day (BID) | ORAL | Status: DC

## 2012-06-01 MED ORDER — SODIUM CHLORIDE 0.9 % IJ SOLN
10.0000 mL | INTRAMUSCULAR | Status: DC | PRN
  Administered 2012-06-01: 10 mL
  Filled 2012-06-01: qty 10

## 2012-06-01 NOTE — Progress Notes (Signed)
OFFICE PROGRESS NOTE  CC  [facility], MD [address] [place] [address] Dr. Cyndia Bent Dr. Chipper Herb  DIAGNOSIS: 77 year old female with new diagnosis of invasive ductal carcinoma that is ER positive PR positive HER-2/neu positive by [facility] Testing  PRIOR THERAPY:  #1 patient was originally seenBy me on 02/21/2012 when she was diagnosed with invasive ductal carcinoma of the right breast stage II a with a positive lymph node. She underwent a lumpectomy and sentinel lymph node biopsy.The final pathology revealed a 1.8 cm invasive ductal carcinoma with low grade ductal carcinoma in situ the tumor was grade 2 ER positive PR positive Ki-67 was 11%. HER-2/neu was equivocal by cish.  #2 she had a sentinel lymph node biopsy one node was positive for metastatic disease. Making her a stage II A.  #3 patient and I and her family had an extensive discussion at the original meeting and we sent her tumor for HER-2/neu testing by [facility]. By [facility]  HER-2/neu is positive.  #4 patient is now going to be receiving combination chemotherapy consisting of Taxotere carboplatinum and Herceptin Beginning 03/30/2012 She will receive TCH combination every 21 days. She will receive day 2 Neulasta when she receives [facility]. On other weeks she will receive weekly Herceptin. A total of 6 cycles Of TCH is planned. Once she completes this she will then proceed with radiation therapy and we will plan on changing her Herceptin to every 3 weeks to complete out one year of therapy.  CURRENT THERAPY: Cycle 4 day 1 of TCH With day 2 Neulasta, and weekly herceptin  INTERVAL HISTORY: Heather Vega 77 y.o. female returns for followup visit today. She's doing well today. She is drinking 1 boost per day and enjoying it.  She has not had any episode of nausea, vomiting, and diarrhea .  She denies fevers, chills, further n/v/d, constipation, numbness/tingling, or any other concerns.    MEDICAL  HISTORY: Past Medical History  Diagnosis Date  . Hyperlipidemia     doesn't require meds  . Asthma     pt states its "not bad"  . Back pain     buldging disc and pt says 2 are "flat"  . Hemorrhoids   . History of colon polyps   . Diverticulosis   . Cancer     breast right  . Hard of hearing   . Diabetes mellitus without complication     takes Metformin daily,Type II  . Allergy   . Cataract     b/l surgery  . Hypothyroidism     hx of;but has been off of meds >3yrs ago  . Wears glasses   . Hypertension     history, was on b/p  meds years ago  . PONV (postoperative nausea and vomiting)     ALLERGIES:  has No Known Allergies.  MEDICATIONS:  Current Outpatient Prescriptions  Medication Sig Dispense Refill  . aspirin 81 MG tablet Take 81 mg by mouth daily.      . carvedilol (COREG) 3.125 MG tablet Take 1 tablet (3.125 mg total) by mouth 2 (two) times daily with a meal.  60 tablet  3  . CINNAMON PO Take 1 tablet by mouth 2 (two) times daily.       Marland Kitchen dexamethasone (DECADRON) 4 MG tablet Take 2 tablets (8 mg total) by mouth 2 (two) times daily with a meal. Take two times a day the day before Taxotere. Then take two times a day starting the day after chemo  for 3 days.  30 tablet  1  . lidocaine-prilocaine (EMLA) cream Apply topically as needed.  30 g  7  . LORazepam (ATIVAN) 0.5 MG tablet Take 1 tablet (0.5 mg total) by mouth every 6 (six) hours as needed (Nausea or vomiting).  30 tablet  0  . metFORMIN (GLUCOPHAGE) 1000 MG tablet Take 1,000 mg by mouth 2 (two) times daily with a meal.      . Multiple Vitamins-Minerals (MULTIVITAMIN WITH MINERALS) tablet Take 1 tablet by mouth daily.      . naproxen sodium (ANAPROX) 220 MG tablet Take 220 mg by mouth as needed.      . Nutritional Supplements (FIBER FORMULA) POWD Take 30 mLs by mouth daily. tkes 2-3 tablespoons daily for constipation      . ondansetron (ZOFRAN) 8 MG tablet Take 1 tablet (8 mg total) by mouth 2 (two) times daily. Take  two times a day starting the day after chemo for 3 days. Then take two times a day as needed for nausea or vomiting.  30 tablet  1  . prochlorperazine (COMPAZINE) 10 MG tablet Take 1 tablet (10 mg total) by mouth every 6 (six) hours as needed (Nausea or vomiting).  30 tablet  1  . prochlorperazine (COMPAZINE) 25 MG suppository Place 1 suppository (25 mg total) rectally every 12 (twelve) hours as needed for nausea.  12 suppository  3  . Vitamins C E (CRANBERRY CONCENTRATE PO) Take 1 tablet by mouth 2 (two) times daily.        No current facility-administered medications for this visit.    SURGICAL HISTORY:  Past Surgical History  Procedure Laterality Date  . Nose surgery  1953  . Tonsillectomy  1955  . Nasal fracture surgery      as a child x 2  . Tonsillectomy    . Breast surgery  70's    right  . Appendectomy    . Bilateral cataract surgery    . Colonoscopy    . Neuroplasty / transposition median nerve at carpal tunnel bilateral    . Cardiac catheterization  20+yrs ago  . Breast lumpectomy with needle localization and axillary sentinel lymph node bx  02/07/2012    Procedure: BREAST LUMPECTOMY WITH NEEDLE LOCALIZATION AND AXILLARY SENTINEL LYMPH NODE BX;  Surgeon: Currie Paris, MD;  Location: [facility] OR;  Service: General;  Laterality: Right;  Right needle loc lumpectomy and Senitel Lymph Node Biopsy   . Abdominal hysterectomy      ovaries intact  . Portacath placement  03/26/2012    Procedure: INSERTION PORT-A-CATH;  Surgeon: Currie Paris, MD;  Location: [facility];  Service: General;  Laterality: Left;    REVIEW OF SYSTEMS:   General: fatigue (+), night sweats (-), fever (-), pain (-) Lymph: palpable nodes (-) HEENT: vision changes (-), mucositis (-), gum bleeding (-), epistaxis (-) Cardiovascular: chest pain (-), palpitations (-) Pulmonary: shortness of breath (-), dyspnea on exertion (-), cough (-), hemoptysis (-) GI:  Early satiety (-), melena (-),  dysphagia (-), nausea/vomiting (-), diarrhea (-) GU: dysuria (-), hematuria (-), incontinence (-) Musculoskeletal: joint swelling (-), joint pain (+), back pain (-) Neuro: weakness (-), numbness (-), headache (-), confusion (-) Skin: Rash (-), lesions (-), dryness (-) Psych: depression (-), suicidal/homicidal ideation (-), feeling of hopelessness (-)  PHYSICAL EXAMINATION: Blood pressure 147/81, pulse 89, temperature 97.5 F (36.4 C), temperature source Oral, weight 183 lb 14.4 oz (83.416 kg). Body mass index is 30.6 kg/(m^2). General: Patient  is a well appearing female in no acute distress HEENT: PERRLA, sclerae anicteric no conjunctival pallor, MMM Neck: supple, no palpable adenopathy Lungs: clear to auscultation bilaterally, no wheezes, rhonchi, or rales Cardiovascular: regular rate rhythm, S1, S2, no murmurs, rubs or gallops Abdomen: Soft, non-tender, non-distended, normoactive bowel sounds, no HSM Extremities: warm and well perfused, no clubbing, cyanosis, or edema Skin: No rashes or lesions Neuro: Non-focal Right breast lumpectomy site without nodularity ECOG PERFORMANCE STATUS: 0 - Asymptomatic      LABORATORY DATA: Lab Results  Component Value Date   WBC 17.0* 06/01/2012   HGB 10.0* 06/01/2012   HCT 30.0* 06/01/2012   MCV 93.5 06/01/2012   PLT 193 06/01/2012      Chemistry      Component Value Date/Time   NA 138 05/25/2012 1053   NA 138 01/31/2012 1030   K 4.2 05/25/2012 1053   K 4.9 01/31/2012 1030   CL 103 05/25/2012 1053   CL 98 01/31/2012 1030   CO2 24 05/25/2012 1053   CO2 28 01/31/2012 1030   BUN 20.8 05/25/2012 1053   BUN 18 01/31/2012 1030   CREATININE 0.7 05/25/2012 1053   CREATININE 0.78 01/31/2012 1030      Component Value Date/Time   CALCIUM 9.0 05/25/2012 1053   CALCIUM 9.8 01/31/2012 1030   ALKPHOS 124 05/25/2012 1053   ALKPHOS 75 01/31/2012 1030   AST 15 05/25/2012 1053   AST 32 01/31/2012 1030   ALT 22 05/25/2012 1053   ALT 23 01/31/2012 1030   BILITOT 0.44  05/25/2012 1053   BILITOT 0.4 01/31/2012 1030       RADIOGRAPHIC STUDIES:  No results found.  ASSESSMENT: 77 year old female with  #1 stage II A. (T1 C. N1 a  cM0) invasive ductal carcinoma Measuring 1.8 cm ER positive PR positive HER-2/neu positive. One sentinel node was positive for metastatic disease.  #2 patient is a good candidate for adjuvant HER-2-based therapy. We discussed treatment with Taxotere carboplatinum and Herceptin. The Taxotere carboplatinum will be given every 3 weeks with day 2 Neulasta for a total of 6 cycles. The Herceptin will be given weekly for the duration of the chemotherapy and then changed to every 3 weeks to complete one year of Herceptin therapy. Patient's TCH was begun on 03/30/2012 that will be completed on 07/13/2012.  #3 because patient has had a lumpectomy she will also require radiation therapy the patient has been seen by Dr. Ballard Russell regarding this we will refer her back to Dr. Dayton Scrape after she completes her chemotherapy.   PLAN:  #1 patient will proceed with cycle 1 day 1 of TCH today. She will come back tomorrow for day 2 Neulasta. Overall she is tolerating her chemotherapy very well.  #2 patient will return in one week's time for lab and Herceptin as well as followup. All questions were answered. The patient knows to call the clinic with any problems, questions or concerns. We can certainly see the patient much sooner if necessary.  I spent 25 minutes counseling the patient face to face. The total time spent in the appointment was 30 minutes.

## 2012-06-01 NOTE — Patient Instructions (Addendum)
[facility] Discharge Instructions for Patients Receiving Chemotherapy  Today you received the following chemotherapy agents Taxotere, Carboplatin and Herceptin.  To help prevent nausea and vomiting after your treatment, we encourage you to take your nausea medication as prescribed.    If you develop nausea and vomiting that is not controlled by your nausea medication, call the clinic. If it is after clinic hours your family physician or the after hours number for the clinic or go to the Emergency Department.   BELOW ARE SYMPTOMS THAT SHOULD BE REPORTED IMMEDIATELY:  *FEVER GREATER THAN 100.5 F  *CHILLS WITH OR WITHOUT FEVER  NAUSEA AND VOMITING THAT IS NOT CONTROLLED WITH YOUR NAUSEA MEDICATION  *UNUSUAL SHORTNESS OF BREATH  *UNUSUAL BRUISING OR BLEEDING  TENDERNESS IN MOUTH AND THROAT WITH OR WITHOUT PRESENCE OF ULCERS  *URINARY PROBLEMS  *BOWEL PROBLEMS  UNUSUAL RASH Items with * indicate a potential emergency and should be followed up as soon as possible.  Please let the nurse know about any problems that you may have experienced. Feel free to call the clinic you have any questions or concerns. The clinic phone number is [phone].   I have been informed and understand all the instructions given to me. I know to contact the clinic, my physician, or go to the Emergency Department if any problems should occur. I do not have any questions at this time, but understand that I may call the clinic during office hours   should I have any questions or need assistance in obtaining follow up care.    __________________________________________  _____________  __________ Signature of Patient or Authorized Representative            Date                   Time    __________________________________________ Nurse's Signature

## 2012-06-02 ENCOUNTER — Ambulatory Visit (HOSPITAL_BASED_OUTPATIENT_CLINIC_OR_DEPARTMENT_OTHER): Payer: Medicare Other

## 2012-06-02 VITALS — BP 154/70 | HR 74 | Temp 98.1°F | Resp 16

## 2012-06-02 DIAGNOSIS — C773 Secondary and unspecified malignant neoplasm of axilla and upper limb lymph nodes: Secondary | ICD-10-CM

## 2012-06-02 MED ORDER — PEGFILGRASTIM INJECTION 6 MG/0.6ML
6.0000 mg | Freq: Once | SUBCUTANEOUS | Status: AC
  Administered 2012-06-02: 6 mg via SUBCUTANEOUS

## 2012-06-04 ENCOUNTER — Other Ambulatory Visit: Payer: Self-pay | Admitting: Certified Registered Nurse Anesthetist

## 2012-06-08 ENCOUNTER — Other Ambulatory Visit (HOSPITAL_BASED_OUTPATIENT_CLINIC_OR_DEPARTMENT_OTHER): Payer: Medicare Other | Admitting: Lab

## 2012-06-08 ENCOUNTER — Ambulatory Visit (HOSPITAL_BASED_OUTPATIENT_CLINIC_OR_DEPARTMENT_OTHER): Payer: Medicare Other

## 2012-06-08 ENCOUNTER — Other Ambulatory Visit: Payer: Self-pay | Admitting: Oncology

## 2012-06-08 ENCOUNTER — Ambulatory Visit (HOSPITAL_BASED_OUTPATIENT_CLINIC_OR_DEPARTMENT_OTHER): Payer: Medicare Other | Admitting: Adult Health

## 2012-06-08 ENCOUNTER — Other Ambulatory Visit: Payer: Self-pay | Admitting: *Deleted

## 2012-06-08 ENCOUNTER — Encounter: Payer: Self-pay | Admitting: Adult Health

## 2012-06-08 VITALS — BP 131/75 | HR 116 | Temp 98.7°F | Resp 20 | Ht 65.0 in | Wt 178.7 lb

## 2012-06-08 DIAGNOSIS — Z5112 Encounter for antineoplastic immunotherapy: Secondary | ICD-10-CM

## 2012-06-08 DIAGNOSIS — C50911 Malignant neoplasm of unspecified site of right female breast: Secondary | ICD-10-CM

## 2012-06-08 DIAGNOSIS — C50419 Malignant neoplasm of upper-outer quadrant of unspecified female breast: Secondary | ICD-10-CM

## 2012-06-08 LAB — COMPREHENSIVE METABOLIC PANEL (CC13)
ALT: 31 U/L (ref 0–55)
CO2: 28 mEq/L (ref 22–29)
Calcium: 9.4 mg/dL (ref 8.4–10.4)
Chloride: 99 mEq/L (ref 98–107)
Creatinine: 0.7 mg/dL (ref 0.6–1.1)
Total Protein: 6.4 g/dL (ref 6.4–8.3)

## 2012-06-08 LAB — CBC WITH DIFFERENTIAL/PLATELET
BASO%: 0.2 % (ref 0.0–2.0)
HCT: 34.4 % — ABNORMAL LOW (ref 34.8–46.6)
LYMPH%: 27.9 % (ref 14.0–49.7)
MCH: 31.5 pg (ref 25.1–34.0)
MCHC: 33.7 g/dL (ref 31.5–36.0)
MCV: 93.5 fL (ref 79.5–101.0)
MONO#: 1 10*3/uL — ABNORMAL HIGH (ref 0.1–0.9)
MONO%: 10.7 % (ref 0.0–14.0)
NEUT%: 60.8 % (ref 38.4–76.8)
Platelets: 376 10*3/uL (ref 145–400)
WBC: 9.6 10*3/uL (ref 3.9–10.3)

## 2012-06-08 MED ORDER — SODIUM CHLORIDE 0.9 % IV SOLN
INTRAVENOUS | Status: DC
  Administered 2012-06-08: 15:00:00 via INTRAVENOUS

## 2012-06-08 MED ORDER — TRASTUZUMAB CHEMO INJECTION 440 MG
2.0000 mg/kg | Freq: Once | INTRAVENOUS | Status: AC
  Administered 2012-06-08: 168 mg via INTRAVENOUS
  Filled 2012-06-08: qty 8

## 2012-06-08 MED ORDER — SODIUM CHLORIDE 0.9 % IJ SOLN
10.0000 mL | INTRAMUSCULAR | Status: DC | PRN
  Administered 2012-06-08: 10 mL
  Filled 2012-06-08: qty 10

## 2012-06-08 MED ORDER — SODIUM CHLORIDE 0.9 % IV SOLN
Freq: Once | INTRAVENOUS | Status: AC
  Administered 2012-06-08: 14:00:00 via INTRAVENOUS

## 2012-06-08 MED ORDER — ACETAMINOPHEN 325 MG PO TABS
650.0000 mg | ORAL_TABLET | Freq: Once | ORAL | Status: AC
  Administered 2012-06-08: 650 mg via ORAL

## 2012-06-08 MED ORDER — DIPHENHYDRAMINE HCL 25 MG PO CAPS
25.0000 mg | ORAL_CAPSULE | Freq: Once | ORAL | Status: AC
  Administered 2012-06-08: 25 mg via ORAL

## 2012-06-08 MED ORDER — HEPARIN SOD (PORK) LOCK FLUSH 100 UNIT/ML IV SOLN
500.0000 [IU] | Freq: Once | INTRAVENOUS | Status: AC | PRN
  Administered 2012-06-08: 500 [IU]
  Filled 2012-06-08: qty 5

## 2012-06-08 NOTE — Patient Instructions (Addendum)
[facility] Discharge Instructions for Patients Receiving Chemotherapy  Today you received the following chemotherapy agents Herceptin.  To help prevent nausea and vomiting after your treatment, we encourage you to take your nausea medication as prescribed.   If you develop nausea and vomiting that is not controlled by your nausea medication, call the clinic. If it is after clinic hours your family physician or the after hours number for the clinic or go to the Emergency Department.   BELOW ARE SYMPTOMS THAT SHOULD BE REPORTED IMMEDIATELY:  *FEVER GREATER THAN 100.5 F  *CHILLS WITH OR WITHOUT FEVER  NAUSEA AND VOMITING THAT IS NOT CONTROLLED WITH YOUR NAUSEA MEDICATION  *UNUSUAL SHORTNESS OF BREATH  *UNUSUAL BRUISING OR BLEEDING  TENDERNESS IN MOUTH AND THROAT WITH OR WITHOUT PRESENCE OF ULCERS  *URINARY PROBLEMS  *BOWEL PROBLEMS  UNUSUAL RASH Items with * indicate a potential emergency and should be followed up as soon as possible.  Feel free to call the clinic you have any questions or concerns. The clinic phone number is [phone].   I have been informed and understand all the instructions given to me. I know to contact the clinic, my physician, or go to the Emergency Department if any problems should occur. I do not have any questions at this time, but understand that I may call the clinic during office hours   should I have any questions or need assistance in obtaining follow up care.    __________________________________________  _____________  __________ Signature of Patient or Authorized Representative            Date                   Time    __________________________________________ Nurse's Signature   Dehydration, Adult Dehydration is when you lose more fluids from the body than you take in. Vital organs like the kidneys, brain, and heart cannot function without a proper amount of fluids and salt. Any loss of fluids from the body can  cause dehydration.  CAUSES   Vomiting.  Diarrhea.  Excessive sweating.  Excessive urine output.  Fever. SYMPTOMS  Mild dehydration  Thirst.  Dry lips.  Slightly dry mouth. Moderate dehydration  Very dry mouth.  Sunken eyes.  Skin does not bounce back quickly when lightly pinched and released.  Dark urine and decreased urine production.  Decreased tear production.  Headache. Severe dehydration  Very dry mouth.  Extreme thirst.  Rapid, weak pulse (more than 100 beats per minute at rest).  Cold hands and feet.  Not able to sweat in spite of heat and temperature.  Rapid breathing.  Blue lips.  Confusion and lethargy.  Difficulty being awakened.  Minimal urine production.  No tears. DIAGNOSIS  Your caregiver will diagnose dehydration based on your symptoms and your exam. Blood and urine tests will help confirm the diagnosis. The diagnostic evaluation should also identify the cause of dehydration. TREATMENT  Treatment of mild or moderate dehydration can often be done at home by increasing the amount of fluids that you drink. It is best to drink small amounts of fluid more often. Drinking too much at one time can make vomiting worse. Refer to the home care instructions below. Severe dehydration needs to be treated at the hospital where you will probably be given intravenous (IV) fluids that contain water and electrolytes. HOME CARE INSTRUCTIONS   Ask your caregiver about specific rehydration instructions.  Drink enough fluids to keep your urine clear or pale yellow.  Drink small amounts frequently if you have nausea and vomiting.  Eat as you normally do.  Avoid:  Foods or drinks high in sugar.  Carbonated drinks.  Juice.  Extremely hot or cold fluids.  Drinks with caffeine.  Fatty, greasy foods.  Alcohol.  Tobacco.  Overeating.  Gelatin desserts.  Wash your hands well to avoid spreading bacteria and viruses.  Only take  over-the-counter or prescription medicines for pain, discomfort, or fever as directed by your caregiver.  Ask your caregiver if you should continue all prescribed and over-the-counter medicines.  Keep all follow-up appointments with your caregiver. SEEK MEDICAL CARE IF:  You have abdominal pain and it increases or stays in one area (localizes).  You have a rash, stiff neck, or severe headache.  You are irritable, sleepy, or difficult to awaken.  You are weak, dizzy, or extremely thirsty. SEEK IMMEDIATE MEDICAL CARE IF:   You are unable to keep fluids down or you get worse despite treatment.  You have frequent episodes of vomiting or diarrhea.  You have blood or green matter (bile) in your vomit.  You have blood in your stool or your stool looks black and tarry.  You have not urinated in 6 to 8 hours, or you have only urinated a small amount of very dark urine.  You have a fever.  You faint. MAKE SURE YOU:   Understand these instructions.  Will watch your condition.  Will get help right away if you are not doing well or get worse. Document Released: 03/14/2005 Document Revised: 06/06/2011 Document Reviewed: 11/01/2010 [facility] Patient Information 2013 [place], [place].

## 2012-06-08 NOTE — Progress Notes (Signed)
OFFICE PROGRESS NOTE  CC  [facility], MD [address] [place] [address] Dr. Cyndia Bent Dr. Chipper Herb  DIAGNOSIS: 77 year old female with new diagnosis of invasive ductal carcinoma that is ER positive PR positive HER-2/neu positive by [facility]  PRIOR THERAPY:  #1 patient was originally seenBy me on 02/21/2012 when she was diagnosed with invasive ductal carcinoma of the right breast stage II a with a positive lymph node. She underwent a lumpectomy and sentinel lymph node biopsy.The final pathology revealed a 1.8 cm invasive ductal carcinoma with low grade ductal carcinoma in situ the tumor was grade 2 ER positive PR positive Ki-67 was 11%. HER-2/neu was equivocal by cish.  #2 she had a sentinel lymph node biopsy one node was positive for metastatic disease. Making her a stage II A.  #3 patient and I and her family had an extensive discussion at the original meeting and we sent her tumor for HER-2/neu testing by [facility]. By [facility]  HER-2/neu is positive.  #4 patient is now going to be receiving combination chemotherapy consisting of Taxotere carboplatinum and Herceptin Beginning 03/30/2012 She will receive TCH combination every 21 days. She will receive day 2 Neulasta when she receives [facility]. On other weeks she will receive weekly Herceptin. A total of 6 cycles Of TCH is planned. Once she completes this she will then proceed with radiation therapy and we will plan on changing her Herceptin to every 3 weeks to complete out one year of therapy.  CURRENT THERAPY: Cycle 4 day 8 of TCH With day 2 Neulasta, and weekly herceptin  INTERVAL HISTORY: Haruye Lainez Erman 77 y.o. female returns for followup visit today. She's doing well today.  She does feel tired and weak.  She was constipated earlier and a suppository helped.  She's also been nauseated, and taking her antiemetics.  She's had bone pain in her knees, that she didn't take tylenol or aleve for.  This is her usual  neulasta pain.  She denies fevers, chills, pain or any other concerns.    MEDICAL HISTORY: Past Medical History  Diagnosis Date  . Hyperlipidemia     doesn't require meds  . Asthma     pt states its "not bad"  . Back pain     buldging disc and pt says 2 are "flat"  . Hemorrhoids   . History of colon polyps   . Diverticulosis   . Cancer     breast right  . Hard of hearing   . Diabetes mellitus without complication     takes Metformin daily,Type II  . Allergy   . Cataract     b/l surgery  . Hypothyroidism     hx of;but has been off of meds >22yrs ago  . Wears glasses   . Hypertension     history, was on b/p  meds years ago  . PONV (postoperative nausea and vomiting)     ALLERGIES:  has No Known Allergies.  MEDICATIONS:  Current Outpatient Prescriptions  Medication Sig Dispense Refill  . aspirin 81 MG tablet Take 81 mg by mouth daily.      . carvedilol (COREG) 3.125 MG tablet Take 1 tablet (3.125 mg total) by mouth 2 (two) times daily with a meal.  60 tablet  3  . CINNAMON PO Take 1 tablet by mouth 2 (two) times daily.       Marland Kitchen dexamethasone (DECADRON) 4 MG tablet Take 2 tablets (8 mg total) by mouth 2 (two) times daily with  a meal. Take two times a day the day before Taxotere. Then take two times a day starting the day after chemo for 3 days.  60 tablet  6  . lidocaine-prilocaine (EMLA) cream Apply topically as needed.  30 g  7  . LORazepam (ATIVAN) 0.5 MG tablet Take 1 tablet (0.5 mg total) by mouth every 6 (six) hours as needed (Nausea or vomiting).  30 tablet  0  . metFORMIN (GLUCOPHAGE) 1000 MG tablet Take 1,000 mg by mouth 2 (two) times daily with a meal.      . Multiple Vitamins-Minerals (MULTIVITAMIN WITH MINERALS) tablet Take 1 tablet by mouth daily.      . naproxen sodium (ANAPROX) 220 MG tablet Take 220 mg by mouth as needed.      . Nutritional Supplements (FIBER FORMULA) POWD Take 30 mLs by mouth daily. tkes 2-3 tablespoons daily for constipation      .  ondansetron (ZOFRAN) 8 MG tablet Take 1 tablet (8 mg total) by mouth 2 (two) times daily. Take two times a day starting the day after chemo for 3 days. Then take two times a day as needed for nausea or vomiting.  30 tablet  1  . prochlorperazine (COMPAZINE) 10 MG tablet Take 1 tablet (10 mg total) by mouth every 6 (six) hours as needed (Nausea or vomiting).  30 tablet  1  . prochlorperazine (COMPAZINE) 25 MG suppository Place 1 suppository (25 mg total) rectally every 12 (twelve) hours as needed for nausea.  12 suppository  3  . Vitamins C E (CRANBERRY CONCENTRATE PO) Take 1 tablet by mouth 2 (two) times daily.        No current facility-administered medications for this visit.    SURGICAL HISTORY:  Past Surgical History  Procedure Laterality Date  . Nose surgery  1953  . Tonsillectomy  1955  . Nasal fracture surgery      as a child x 2  . Tonsillectomy    . Breast surgery  70's    right  . Appendectomy    . Bilateral cataract surgery    . Colonoscopy    . Neuroplasty / transposition median nerve at carpal tunnel bilateral    . Cardiac catheterization  20+yrs ago  . Breast lumpectomy with needle localization and axillary sentinel lymph node bx  02/07/2012    Procedure: BREAST LUMPECTOMY WITH NEEDLE LOCALIZATION AND AXILLARY SENTINEL LYMPH NODE BX;  Surgeon: Currie Paris, MD;  Location: [facility] OR;  Service: General;  Laterality: Right;  Right needle loc lumpectomy and Senitel Lymph Node Biopsy   . Abdominal hysterectomy      ovaries intact  . Portacath placement  03/26/2012    Procedure: INSERTION PORT-A-CATH;  Surgeon: Currie Paris, MD;  Location: [facility];  Service: General;  Laterality: Left;    REVIEW OF SYSTEMS:   General: fatigue (+), night sweats (-), fever (-), pain (-) Lymph: palpable nodes (-) HEENT: vision changes (-), mucositis (-), gum bleeding (-), epistaxis (-) Cardiovascular: chest pain (-), palpitations (-) Pulmonary: shortness of breath  (-), dyspnea on exertion (-), cough (-), hemoptysis (-) GI:  Early satiety (-), melena (-), dysphagia (-), nausea/vomiting (+), diarrhea (-) GU: dysuria (-), hematuria (-), incontinence (-) Musculoskeletal: joint swelling (-), joint pain (+), back pain (-) Neuro: weakness (-), numbness (-), headache (-), confusion (-) Skin: Rash (-), lesions (-), dryness (-) Psych: depression (-), suicidal/homicidal ideation (-), feeling of hopelessness (-)  PHYSICAL EXAMINATION: Blood pressure 131/75, pulse 116, temperature  98.7 F (37.1 C), temperature source Oral, resp. rate 20, height 5\' 5"  (1.651 m), weight 178 lb 11.2 oz (81.058 kg). Body mass index is 29.74 kg/(m^2). General: Patient is a well appearing female in no acute distress HEENT: PERRLA, sclerae anicteric no conjunctival pallor, MMM Neck: supple, no palpable adenopathy Lungs: clear to auscultation bilaterally, no wheezes, rhonchi, or rales Cardiovascular: regular rate rhythm, S1, S2, no murmurs, rubs or gallops Abdomen: Soft, non-tender, non-distended, normoactive bowel sounds, no HSM Extremities: warm and well perfused, no clubbing, cyanosis, or edema Skin: No rashes or lesions Neuro: Non-focal Right breast lumpectomy site without nodularity ECOG PERFORMANCE STATUS: 0 - Asymptomatic      LABORATORY DATA: Lab Results  Component Value Date   WBC 9.6 06/08/2012   HGB 11.6 06/08/2012   HCT 34.4* 06/08/2012   MCV 93.5 06/08/2012   PLT 376 06/08/2012      Chemistry      Component Value Date/Time   NA 140 06/01/2012 1019   NA 138 01/31/2012 1030   K 4.1 06/01/2012 1019   K 4.9 01/31/2012 1030   CL 104 06/01/2012 1019   CL 98 01/31/2012 1030   CO2 23 06/01/2012 1019   CO2 28 01/31/2012 1030   BUN 21.8 06/01/2012 1019   BUN 18 01/31/2012 1030   CREATININE 0.7 06/01/2012 1019   CREATININE 0.78 01/31/2012 1030      Component Value Date/Time   CALCIUM 9.7 06/01/2012 1019   CALCIUM 9.8 01/31/2012 1030   ALKPHOS 98 06/01/2012 1019   ALKPHOS 75 01/31/2012  1030   AST 15 06/01/2012 1019   AST 32 01/31/2012 1030   ALT 25 06/01/2012 1019   ALT 23 01/31/2012 1030   BILITOT 0.35 06/01/2012 1019   BILITOT 0.4 01/31/2012 1030       RADIOGRAPHIC STUDIES:  No results found.  ASSESSMENT: 77 year old female with  #1 stage II A. (T1 C. N1 a  cM0) invasive ductal carcinoma Measuring 1.8 cm ER positive PR positive HER-2/neu positive. One sentinel node was positive for metastatic disease.  #2 patient is a good candidate for adjuvant HER-2-based therapy. We discussed treatment with Taxotere carboplatinum and Herceptin. The Taxotere carboplatinum will be given every 3 weeks with day 2 Neulasta for a total of 6 cycles. The Herceptin will be given weekly for the duration of the chemotherapy and then changed to every 3 weeks to complete one year of Herceptin therapy.  #3 because patient has had a lumpectomy she will also require radiation therapy the patient has been seen by Dr. Ballard Russell regarding this we will refer her back to Dr. Dayton Scrape after she completes her chemotherapy.   PLAN:   #1Ms. Arsenau will proceed with Herceptin today.  I will also give her IV fluids.     #2 We will see her back next week for weekly herceptin.  I also sent appt dates to our schedulers to schedule.   All questions were answered. The patient knows to call the clinic with any problems, questions or concerns. We can certainly see the patient much sooner if necessary.  I spent 25 minutes counseling the patient face to face. The total time spent in the appointment was 30 minutes.  Cherie Ouch Lyn Hollingshead, NP Medical Oncology [facility] Phone: [phone]

## 2012-06-08 NOTE — Patient Instructions (Signed)
Doing well.  Proceed with chemotherapy.  We will add on IV fluids today.

## 2012-06-11 ENCOUNTER — Telehealth: Payer: Self-pay | Admitting: *Deleted

## 2012-06-15 ENCOUNTER — Encounter: Payer: Self-pay | Admitting: Adult Health

## 2012-06-15 ENCOUNTER — Telehealth: Payer: Self-pay | Admitting: *Deleted

## 2012-06-15 ENCOUNTER — Ambulatory Visit (HOSPITAL_BASED_OUTPATIENT_CLINIC_OR_DEPARTMENT_OTHER): Payer: Medicare Other

## 2012-06-15 ENCOUNTER — Ambulatory Visit (HOSPITAL_BASED_OUTPATIENT_CLINIC_OR_DEPARTMENT_OTHER): Payer: Medicare Other | Admitting: Adult Health

## 2012-06-15 ENCOUNTER — Other Ambulatory Visit (HOSPITAL_BASED_OUTPATIENT_CLINIC_OR_DEPARTMENT_OTHER): Payer: Medicare Other | Admitting: Lab

## 2012-06-15 VITALS — BP 144/78 | HR 83 | Temp 97.5°F | Resp 20 | Ht 65.0 in | Wt 185.1 lb

## 2012-06-15 DIAGNOSIS — E860 Dehydration: Secondary | ICD-10-CM

## 2012-06-15 DIAGNOSIS — Z5112 Encounter for antineoplastic immunotherapy: Secondary | ICD-10-CM

## 2012-06-15 DIAGNOSIS — Z170 Estrogen receptor positive status [ER+]: Secondary | ICD-10-CM

## 2012-06-15 DIAGNOSIS — C50419 Malignant neoplasm of upper-outer quadrant of unspecified female breast: Secondary | ICD-10-CM

## 2012-06-15 DIAGNOSIS — C50911 Malignant neoplasm of unspecified site of right female breast: Secondary | ICD-10-CM

## 2012-06-15 DIAGNOSIS — I889 Nonspecific lymphadenitis, unspecified: Secondary | ICD-10-CM

## 2012-06-15 DIAGNOSIS — R4182 Altered mental status, unspecified: Secondary | ICD-10-CM

## 2012-06-15 LAB — CBC WITH DIFFERENTIAL/PLATELET
Basophils Absolute: 0 10*3/uL (ref 0.0–0.1)
EOS%: 0.2 % (ref 0.0–7.0)
HCT: 30.6 % — ABNORMAL LOW (ref 34.8–46.6)
HGB: 10.1 g/dL — ABNORMAL LOW (ref 11.6–15.9)
LYMPH%: 13.5 % — ABNORMAL LOW (ref 14.0–49.7)
MCH: 31.7 pg (ref 25.1–34.0)
MONO#: 0.8 10*3/uL (ref 0.1–0.9)
NEUT%: 81 % — ABNORMAL HIGH (ref 38.4–76.8)
Platelets: 171 10*3/uL (ref 145–400)
lymph#: 2.1 10*3/uL (ref 0.9–3.3)

## 2012-06-15 LAB — COMPREHENSIVE METABOLIC PANEL (CC13)
Albumin: 3.5 g/dL (ref 3.5–5.0)
BUN: 22.8 mg/dL (ref 7.0–26.0)
Calcium: 9 mg/dL (ref 8.4–10.4)
Chloride: 104 mEq/L (ref 98–107)
Creatinine: 0.7 mg/dL (ref 0.6–1.1)
Glucose: 134 mg/dl — ABNORMAL HIGH (ref 70–99)
Potassium: 4.5 mEq/L (ref 3.5–5.1)

## 2012-06-15 MED ORDER — SODIUM CHLORIDE 0.9 % IJ SOLN
10.0000 mL | INTRAMUSCULAR | Status: DC | PRN
  Administered 2012-06-15: 10 mL
  Filled 2012-06-15: qty 10

## 2012-06-15 MED ORDER — SODIUM CHLORIDE 0.9 % IV SOLN: Freq: Once | INTRAVENOUS | Status: AC

## 2012-06-15 MED ORDER — HEPARIN SOD (PORK) LOCK FLUSH 100 UNIT/ML IV SOLN
500.0000 [IU] | Freq: Once | INTRAVENOUS | Status: AC | PRN
  Administered 2012-06-15: 500 [IU]
  Filled 2012-06-15: qty 5

## 2012-06-15 MED ORDER — SODIUM CHLORIDE 0.9 % IV SOLN
Freq: Once | INTRAVENOUS | Status: AC
  Administered 2012-06-15: 10:00:00 via INTRAVENOUS

## 2012-06-15 MED ORDER — LORAZEPAM 0.5 MG PO TABS: 0.5000 mg | ORAL_TABLET | Freq: Four times a day (QID) | ORAL | Status: DC | PRN

## 2012-06-15 MED ORDER — ACETAMINOPHEN 325 MG PO TABS
650.0000 mg | ORAL_TABLET | Freq: Once | ORAL | Status: AC
  Administered 2012-06-15: 650 mg via ORAL

## 2012-06-15 MED ORDER — TRASTUZUMAB CHEMO INJECTION 440 MG
2.0000 mg/kg | Freq: Once | INTRAVENOUS | Status: AC
  Administered 2012-06-15: 168 mg via INTRAVENOUS
  Filled 2012-06-15: qty 8

## 2012-06-15 MED ORDER — AZITHROMYCIN 250 MG PO TABS: ORAL_TABLET | ORAL | Status: DC

## 2012-06-15 MED ORDER — DIPHENHYDRAMINE HCL 25 MG PO CAPS
25.0000 mg | ORAL_CAPSULE | Freq: Once | ORAL | Status: AC
  Administered 2012-06-15: 25 mg via ORAL

## 2012-06-15 NOTE — Patient Instructions (Addendum)
Doing well.  Proceed with Herceptin.  I have given you a Zpak for your swollen lymph nodes and refilled your Lorazepam.  Take one Super B Complex daily for the toe pain.

## 2012-06-15 NOTE — Telephone Encounter (Signed)
appts made and printed...d/t was given for injections and Dr. Gala Romney.

## 2012-06-15 NOTE — Progress Notes (Signed)
OFFICE PROGRESS NOTE  CC  [facility], MD [address] [place] [address] Dr. Cyndia Bent Dr. Chipper Herb  DIAGNOSIS: 77 year old female with new diagnosis of invasive ductal carcinoma that is ER positive PR positive HER-2/neu positive by [facility] Testing  PRIOR THERAPY:  #1 patient was originally seenBy me on 02/21/2012 when she was diagnosed with invasive ductal carcinoma of the right breast stage II a with a positive lymph node. She underwent a lumpectomy and sentinel lymph node biopsy.The final pathology revealed a 1.8 cm invasive ductal carcinoma with low grade ductal carcinoma in situ the tumor was grade 2 ER positive PR positive Ki-67 was 11%. HER-2/neu was equivocal by cish.  #2 she had a sentinel lymph node biopsy one node was positive for metastatic disease. Making her a stage II A.  #3 patient and I and her family had an extensive discussion at the original meeting and we sent her tumor for HER-2/neu testing by [facility]. By [facility]  HER-2/neu is positive.  #4 patient is now going to be receiving combination chemotherapy consisting of Taxotere carboplatinum and Herceptin Beginning 03/30/2012 She will receive TCH combination every 21 days. She will receive day 2 Neulasta when she receives [facility]. On other weeks she will receive weekly Herceptin. A total of 6 cycles Of TCH is planned. Once she completes this she will then proceed with radiation therapy and we will plan on changing her Herceptin to every 3 weeks to complete out one year of therapy.  CURRENT THERAPY: Cycle 4 day 15 of TCH With day 2 Neulasta, and weekly herceptin  INTERVAL HISTORY: Rayvn Rickerson Mealor 77 y.o. female returns for followup visit today. She's doing well today. She continues to feel tired.  She has a swollen gland in her right cervical chain that is tender.  This started yesterday.  She is also requesting a refill on her Ativan today, as it is helping tremendously for her nausea.  She has  occasional toe pain but no numbness.  She denies fevers, chills, vomiting, constipation, diarrhea, numbness, or any other concerns.    MEDICAL HISTORY: Past Medical History  Diagnosis Date  . Hyperlipidemia     doesn't require meds  . Asthma     pt states its "not bad"  . Back pain     buldging disc and pt says 2 are "flat"  . Hemorrhoids   . History of colon polyps   . Diverticulosis   . Cancer     breast right  . Hard of hearing   . Diabetes mellitus without complication     takes Metformin daily,Type II  . Allergy   . Cataract     b/l surgery  . Hypothyroidism     hx of;but has been off of meds >82yrs ago  . Wears glasses   . Hypertension     history, was on b/p  meds years ago  . PONV (postoperative nausea and vomiting)     ALLERGIES:  has No Known Allergies.  MEDICATIONS:  Current Outpatient Prescriptions  Medication Sig Dispense Refill  . aspirin 81 MG tablet Take 81 mg by mouth daily.      . carvedilol (COREG) 3.125 MG tablet Take 1 tablet (3.125 mg total) by mouth 2 (two) times daily with a meal.  60 tablet  3  . CINNAMON PO Take 1 tablet by mouth 2 (two) times daily.       Marland Kitchen dexamethasone (DECADRON) 4 MG tablet Take 2 tablets (8 mg total) by  mouth 2 (two) times daily with a meal. Take two times a day the day before Taxotere. Then take two times a day starting the day after chemo for 3 days.  60 tablet  6  . lidocaine-prilocaine (EMLA) cream Apply topically as needed.  30 g  7  . LORazepam (ATIVAN) 0.5 MG tablet Take 1 tablet (0.5 mg total) by mouth every 6 (six) hours as needed (Nausea or vomiting).  30 tablet  0  . metFORMIN (GLUCOPHAGE) 1000 MG tablet Take 1,000 mg by mouth 2 (two) times daily with a meal.      . Multiple Vitamins-Minerals (MULTIVITAMIN WITH MINERALS) tablet Take 1 tablet by mouth daily.      . naproxen sodium (ANAPROX) 220 MG tablet Take 220 mg by mouth as needed.      . Nutritional Supplements (FIBER FORMULA) POWD Take 30 mLs by mouth daily.  tkes 2-3 tablespoons daily for constipation      . ondansetron (ZOFRAN) 8 MG tablet Take 1 tablet (8 mg total) by mouth 2 (two) times daily. Take two times a day starting the day after chemo for 3 days. Then take two times a day as needed for nausea or vomiting.  30 tablet  1  . prochlorperazine (COMPAZINE) 10 MG tablet Take 1 tablet (10 mg total) by mouth every 6 (six) hours as needed (Nausea or vomiting).  30 tablet  1  . prochlorperazine (COMPAZINE) 25 MG suppository Place 1 suppository (25 mg total) rectally every 12 (twelve) hours as needed for nausea.  12 suppository  3  . Vitamins C E (CRANBERRY CONCENTRATE PO) Take 1 tablet by mouth 2 (two) times daily.        No current facility-administered medications for this visit.    SURGICAL HISTORY:  Past Surgical History  Procedure Laterality Date  . Nose surgery  1953  . Tonsillectomy  1955  . Nasal fracture surgery      as a child x 2  . Tonsillectomy    . Breast surgery  70's    right  . Appendectomy    . Bilateral cataract surgery    . Colonoscopy    . Neuroplasty / transposition median nerve at carpal tunnel bilateral    . Cardiac catheterization  20+yrs ago  . Breast lumpectomy with needle localization and axillary sentinel lymph node bx  02/07/2012    Procedure: BREAST LUMPECTOMY WITH NEEDLE LOCALIZATION AND AXILLARY SENTINEL LYMPH NODE BX;  Surgeon: Currie Paris, MD;  Location: [facility] OR;  Service: General;  Laterality: Right;  Right needle loc lumpectomy and Senitel Lymph Node Biopsy   . Abdominal hysterectomy      ovaries intact  . Portacath placement  03/26/2012    Procedure: INSERTION PORT-A-CATH;  Surgeon: Currie Paris, MD;  Location: [facility];  Service: General;  Laterality: Left;    REVIEW OF SYSTEMS:   General: fatigue (+), night sweats (-), fever (-), pain (-) Lymph: palpable nodes (-) HEENT: vision changes (-), mucositis (-), gum bleeding (-), epistaxis (-) Cardiovascular: chest pain  (-), palpitations (-) Pulmonary: shortness of breath (-), dyspnea on exertion (-), cough (-), hemoptysis (-) GI:  Early satiety (-), melena (-), dysphagia (-), nausea/vomiting (+), diarrhea (-) GU: dysuria (-), hematuria (-), incontinence (-) Musculoskeletal: joint swelling (-), joint pain (+), back pain (-) Neuro: weakness (-), numbness (-), headache (-), confusion (-) Skin: Rash (-), lesions (-), dryness (-) Psych: depression (-), suicidal/homicidal ideation (-), feeling of hopelessness (-)  PHYSICAL EXAMINATION:  Blood pressure 144/78, pulse 83, temperature 97.5 F (36.4 C), temperature source Oral, resp. rate 20, height 5\' 5"  (1.651 m), weight 185 lb 1.6 oz (83.961 kg). Body mass index is 30.8 kg/(m^2). General: Patient is a well appearing female in no acute distress HEENT: PERRLA, sclerae anicteric no conjunctival pallor, MMM Neck: supple, right swollen lymph node in cervical chain, tender to palpation, approx .5 cm Lungs: clear to auscultation bilaterally, no wheezes, rhonchi, or rales Cardiovascular: regular rate rhythm, S1, S2, no murmurs, rubs or gallops Abdomen: Soft, non-tender, non-distended, normoactive bowel sounds, no HSM Extremities: warm and well perfused, no clubbing, cyanosis, or edema Skin: No rashes or lesions Neuro: Non-focal Right breast lumpectomy site without nodularity ECOG PERFORMANCE STATUS: 0 - Asymptomatic      LABORATORY DATA: Lab Results  Component Value Date   WBC 15.3* 06/15/2012   HGB 10.1* 06/15/2012   HCT 30.6* 06/15/2012   MCV 95.9 06/15/2012   PLT 171 06/15/2012      Chemistry      Component Value Date/Time   NA 138 06/08/2012 1059   NA 138 01/31/2012 1030   K 4.0 06/08/2012 1059   K 4.9 01/31/2012 1030   CL 99 06/08/2012 1059   CL 98 01/31/2012 1030   CO2 28 06/08/2012 1059   CO2 28 01/31/2012 1030   BUN 20.5 06/08/2012 1059   BUN 18 01/31/2012 1030   CREATININE 0.7 06/08/2012 1059   CREATININE 0.78 01/31/2012 1030      Component Value  Date/Time   CALCIUM 9.4 06/08/2012 1059   CALCIUM 9.8 01/31/2012 1030   ALKPHOS 123 06/08/2012 1059   ALKPHOS 75 01/31/2012 1030   AST 18 06/08/2012 1059   AST 32 01/31/2012 1030   ALT 31 06/08/2012 1059   ALT 23 01/31/2012 1030   BILITOT 0.32 06/08/2012 1059   BILITOT 0.4 01/31/2012 1030       RADIOGRAPHIC STUDIES:  No results found.  ASSESSMENT: 77 year old female with  #1 stage II A. (T1 C. N1 a  cM0) invasive ductal carcinoma Measuring 1.8 cm ER positive PR positive HER-2/neu positive. One sentinel node was positive for metastatic disease.  #2 patient is a good candidate for adjuvant HER-2-based therapy. We discussed treatment with Taxotere carboplatinum and Herceptin. The Taxotere carboplatinum will be given every 3 weeks with day 2 Neulasta for a total of 6 cycles. The Herceptin will be given weekly for the duration of the chemotherapy and then changed to every 3 weeks to complete one year of Herceptin therapy.  #3 because patient has had a lumpectomy she will also require radiation therapy the patient has been seen by Dr. Ballard Russell regarding this we will refer her back to Dr. Dayton Scrape after she completes her chemotherapy.  #4 lymphadenitis  PLAN:   #1Ms. Arsenau will proceed with Herceptin today.  I have requested f/u with Dr. Gala Romney.  I also refilled her Ativan.  We will give her IV fluids today with the Herceptin.  #2 I prescribed a Zpak for her tender lymph nodes, she recently was on Amoxicillin and didn't tolerate it very well.    #3 We will see her back next week for chemotherapy.  I have added on her Neulasta injections to the POF.    All questions were answered. The patient knows to call the clinic with any problems, questions or concerns. We can certainly see the patient much sooner if necessary.  I spent 25 minutes counseling the patient face to face. The total time spent  in the appointment was 30 minutes.  Cherie Ouch Lyn Hollingshead, NP Medical Oncology [facility] Phone: [phone]

## 2012-06-15 NOTE — Patient Instructions (Signed)
[facility] Discharge Instructions for Patients Receiving Chemotherapy  Today you received the following chemotherapy agents Herceptin To help prevent nausea and vomiting after your treatment, we encourage you to take your nausea medication as prescribed.  If you develop nausea and vomiting that is not controlled by your nausea medication, call the clinic. If it is after clinic hours your family physician or the after hours number for the clinic or go to the Emergency Department.   BELOW ARE SYMPTOMS THAT SHOULD BE REPORTED IMMEDIATELY:  *FEVER GREATER THAN 100.5 F  *CHILLS WITH OR WITHOUT FEVER  NAUSEA AND VOMITING THAT IS NOT CONTROLLED WITH YOUR NAUSEA MEDICATION  *UNUSUAL SHORTNESS OF BREATH  *UNUSUAL BRUISING OR BLEEDING  TENDERNESS IN MOUTH AND THROAT WITH OR WITHOUT PRESENCE OF ULCERS  *URINARY PROBLEMS  *BOWEL PROBLEMS  UNUSUAL RASH Items with * indicate a potential emergency and should be followed up as soon as possible.  One of the nurses will contact you 24 hours after your treatment. Please let the nurse know about any problems that you may have experienced. Feel free to call the clinic you have any questions or concerns. The clinic phone number is [phone].   I have been informed and understand all the instructions given to me. I know to contact the clinic, my physician, or go to the Emergency Department if any problems should occur. I do not have any questions at this time, but understand that I may call the clinic during office hours   should I have any questions or need assistance in obtaining follow up care.    __________________________________________  _____________  __________ Signature of Patient or Authorized Representative            Date                   Time    __________________________________________ Nurse's Signature    

## 2012-06-22 ENCOUNTER — Other Ambulatory Visit (HOSPITAL_BASED_OUTPATIENT_CLINIC_OR_DEPARTMENT_OTHER): Payer: Medicare Other | Admitting: Lab

## 2012-06-22 ENCOUNTER — Ambulatory Visit (HOSPITAL_BASED_OUTPATIENT_CLINIC_OR_DEPARTMENT_OTHER): Payer: Medicare Other | Admitting: Adult Health

## 2012-06-22 ENCOUNTER — Ambulatory Visit (HOSPITAL_BASED_OUTPATIENT_CLINIC_OR_DEPARTMENT_OTHER): Payer: Medicare Other

## 2012-06-22 ENCOUNTER — Encounter: Payer: Self-pay | Admitting: Adult Health

## 2012-06-22 VITALS — BP 146/74 | HR 103 | Temp 97.7°F | Resp 20 | Ht 65.0 in | Wt 187.0 lb

## 2012-06-22 DIAGNOSIS — Z170 Estrogen receptor positive status [ER+]: Secondary | ICD-10-CM

## 2012-06-22 DIAGNOSIS — Z5112 Encounter for antineoplastic immunotherapy: Secondary | ICD-10-CM

## 2012-06-22 DIAGNOSIS — C50419 Malignant neoplasm of upper-outer quadrant of unspecified female breast: Secondary | ICD-10-CM

## 2012-06-22 DIAGNOSIS — C50911 Malignant neoplasm of unspecified site of right female breast: Secondary | ICD-10-CM

## 2012-06-22 DIAGNOSIS — E860 Dehydration: Secondary | ICD-10-CM

## 2012-06-22 DIAGNOSIS — Z5111 Encounter for antineoplastic chemotherapy: Secondary | ICD-10-CM

## 2012-06-22 LAB — COMPREHENSIVE METABOLIC PANEL (CC13)
ALT: 20 U/L (ref 0–55)
AST: 13 U/L (ref 5–34)
Albumin: 3.6 g/dL (ref 3.5–5.0)
Calcium: 9.1 mg/dL (ref 8.4–10.4)
Chloride: 102 mEq/L (ref 98–107)
Potassium: 4.1 mEq/L (ref 3.5–5.1)
Total Protein: 6.6 g/dL (ref 6.4–8.3)

## 2012-06-22 LAB — CBC WITH DIFFERENTIAL/PLATELET
BASO%: 0.2 % (ref 0.0–2.0)
HCT: 30 % — ABNORMAL LOW (ref 34.8–46.6)
MCHC: 33.7 g/dL (ref 31.5–36.0)
MONO#: 0.9 10*3/uL (ref 0.1–0.9)
NEUT%: 87 % — ABNORMAL HIGH (ref 38.4–76.8)
RBC: 3.11 10*6/uL — ABNORMAL LOW (ref 3.70–5.45)
WBC: 17.1 10*3/uL — ABNORMAL HIGH (ref 3.9–10.3)
lymph#: 1.3 10*3/uL (ref 0.9–3.3)
nRBC: 0 % (ref 0–0)

## 2012-06-22 MED ORDER — HEPARIN SOD (PORK) LOCK FLUSH 100 UNIT/ML IV SOLN
500.0000 [IU] | Freq: Once | INTRAVENOUS | Status: AC | PRN
  Administered 2012-06-22: 500 [IU]
  Filled 2012-06-22: qty 5

## 2012-06-22 MED ORDER — ACETAMINOPHEN 325 MG PO TABS
650.0000 mg | ORAL_TABLET | Freq: Once | ORAL | Status: AC
  Administered 2012-06-22: 650 mg via ORAL

## 2012-06-22 MED ORDER — DIPHENHYDRAMINE HCL 25 MG PO CAPS
25.0000 mg | ORAL_CAPSULE | Freq: Once | ORAL | Status: AC
  Administered 2012-06-22: 25 mg via ORAL

## 2012-06-22 MED ORDER — DEXAMETHASONE SODIUM PHOSPHATE 4 MG/ML IJ SOLN
20.0000 mg | Freq: Once | INTRAMUSCULAR | Status: AC
  Administered 2012-06-22: 20 mg via INTRAVENOUS

## 2012-06-22 MED ORDER — SODIUM CHLORIDE 0.9 % IV SOLN
75.0000 mg/m2 | Freq: Once | INTRAVENOUS | Status: AC
  Administered 2012-06-22: 150 mg via INTRAVENOUS
  Filled 2012-06-22: qty 15

## 2012-06-22 MED ORDER — SODIUM CHLORIDE 0.9 % IJ SOLN
10.0000 mL | INTRAMUSCULAR | Status: DC | PRN
  Administered 2012-06-22: 10 mL
  Filled 2012-06-22: qty 10

## 2012-06-22 MED ORDER — SODIUM CHLORIDE 0.9 % IV SOLN
Freq: Once | INTRAVENOUS | Status: AC
  Administered 2012-06-22: 10:00:00 via INTRAVENOUS

## 2012-06-22 MED ORDER — ONDANSETRON 16 MG/50ML IVPB (CHCC)
16.0000 mg | Freq: Once | INTRAVENOUS | Status: AC
  Administered 2012-06-22: 16 mg via INTRAVENOUS

## 2012-06-22 MED ORDER — SODIUM CHLORIDE 0.9 % IV SOLN
652.8000 mg | Freq: Once | INTRAVENOUS | Status: AC
  Administered 2012-06-22: 650 mg via INTRAVENOUS
  Filled 2012-06-22: qty 65

## 2012-06-22 MED ORDER — TRASTUZUMAB CHEMO INJECTION 440 MG
2.0000 mg/kg | Freq: Once | INTRAVENOUS | Status: AC
  Administered 2012-06-22: 168 mg via INTRAVENOUS
  Filled 2012-06-22: qty 8

## 2012-06-22 NOTE — Patient Instructions (Addendum)
[facility] Discharge Instructions for Patients Receiving Chemotherapy  Today you received the following chemotherapy agents Taxotere, Carboplatin and Herceptin.  To help prevent nausea and vomiting after your treatment, we encourage you to take your nausea medication as prescribed.    If you develop nausea and vomiting that is not controlled by your nausea medication, call the clinic. If it is after clinic hours your family physician or the after hours number for the clinic or go to the Emergency Department.   BELOW ARE SYMPTOMS THAT SHOULD BE REPORTED IMMEDIATELY:  *FEVER GREATER THAN 100.5 F  *CHILLS WITH OR WITHOUT FEVER  NAUSEA AND VOMITING THAT IS NOT CONTROLLED WITH YOUR NAUSEA MEDICATION  *UNUSUAL SHORTNESS OF BREATH  *UNUSUAL BRUISING OR BLEEDING  TENDERNESS IN MOUTH AND THROAT WITH OR WITHOUT PRESENCE OF ULCERS  *URINARY PROBLEMS  *BOWEL PROBLEMS  UNUSUAL RASH Items with * indicate a potential emergency and should be followed up as soon as possible.  Please let the nurse know about any problems that you may have experienced. Feel free to call the clinic you have any questions or concerns. The clinic phone number is [phone].   I have been informed and understand all the instructions given to me. I know to contact the clinic, my physician, or go to the Emergency Department if any problems should occur. I do not have any questions at this time, but understand that I may call the clinic during office hours   should I have any questions or need assistance in obtaining follow up care.    __________________________________________  _____________  __________ Signature of Patient or Authorized Representative            Date                   Time    __________________________________________ Nurse's Signature    

## 2012-06-22 NOTE — Patient Instructions (Addendum)
Doing well.  Proceed with chemotherapy.  Continue taking Super B complex daily.  Please call us if you have any questions or concerns.

## 2012-06-22 NOTE — Progress Notes (Addendum)
OFFICE PROGRESS NOTE  CC  [facility], Heather Vega [address] [place] [address] Dr. Cyndia Bent Dr. Chipper Herb  DIAGNOSIS: 77 year old female with new diagnosis of invasive ductal carcinoma that is ER positive PR positive HER-2/neu positive by [facility] Testing  PRIOR THERAPY:  #1 patient was originally seenBy me on 02/21/2012 when she was diagnosed with invasive ductal carcinoma of the right breast stage II a with a positive lymph node. She underwent a lumpectomy and sentinel lymph node biopsy.The final pathology revealed a 1.8 cm invasive ductal carcinoma with low grade ductal carcinoma in situ the tumor was grade 2 ER positive PR positive Ki-67 was 11%. HER-2/neu was equivocal by cish.  #2 she had a sentinel lymph node biopsy one node was positive for metastatic disease. Making her a stage II A.  #3 patient and I and her family had an extensive discussion at the original meeting and we sent her tumor for HER-2/neu testing by [facility]. By [facility]  HER-2/neu is positive.  #4 patient is now going to be receiving combination chemotherapy consisting of Taxotere carboplatinum and Herceptin Beginning 03/30/2012 She will receive TCH combination every 21 days. She will receive day 2 Neulasta when she receives [facility]. On other weeks she will receive weekly Herceptin. A total of 6 cycles Of TCH is planned. Once she completes this she will then proceed with radiation therapy and we will plan on changing her Herceptin to every 3 weeks to complete out one year of therapy.  CURRENT THERAPY: Cycle 5 day 1 of TCH With day 2 Neulasta, and weekly herceptin  INTERVAL HISTORY: Heather Vega 77 y.o. female returns for followup visit today. She's doing well today.  She started taking Super b complex two nights ago.  Her numbness has since resolved.  She denies fevers, chills, nausea, vomiting, diarrhea, constipation, palpitations, chest pain or any other concerns.    MEDICAL HISTORY: Past  Medical History  Diagnosis Date  . Hyperlipidemia     doesn't require meds  . Asthma     pt states its "not bad"  . Back pain     buldging disc and pt says 2 are "flat"  . Hemorrhoids   . History of colon polyps   . Diverticulosis   . Cancer     breast right  . Hard of hearing   . Diabetes mellitus without complication     takes Metformin daily,Type II  . Allergy   . Cataract     b/l surgery  . Hypothyroidism     hx of;but has been off of meds >69yrs ago  . Wears glasses   . Hypertension     history, was on b/p  meds years ago  . PONV (postoperative nausea and vomiting)     ALLERGIES:  has No Known Allergies.  MEDICATIONS:  Current Outpatient Prescriptions  Medication Sig Dispense Refill  . aspirin 81 MG tablet Take 81 mg by mouth daily.      . B Complex-C (SUPER B COMPLEX PO) Take 1 capsule by mouth daily.      . carvedilol (COREG) 3.125 MG tablet Take 1 tablet (3.125 mg total) by mouth 2 (two) times daily with a meal.  60 tablet  3  . CINNAMON PO Take 1 tablet by mouth 2 (two) times daily.       Marland Kitchen dexamethasone (DECADRON) 4 MG tablet Take 2 tablets (8 mg total) by mouth 2 (two) times daily with a meal. Take two times a day the  day before Taxotere. Then take two times a day starting the day after chemo for 3 days.  60 tablet  6  . lidocaine-prilocaine (EMLA) cream Apply topically as needed.  30 g  7  . LORazepam (ATIVAN) 0.5 MG tablet Take 1 tablet (0.5 mg total) by mouth every 6 (six) hours as needed (Nausea or vomiting).  30 tablet  0  . metFORMIN (GLUCOPHAGE) 1000 MG tablet Take 1,000 mg by mouth 2 (two) times daily with a meal.      . Multiple Vitamins-Minerals (MULTIVITAMIN WITH MINERALS) tablet Take 1 tablet by mouth daily.      . naproxen sodium (ANAPROX) 220 MG tablet Take 220 mg by mouth as needed.      . Nutritional Supplements (FIBER FORMULA) POWD Take 30 mLs by mouth daily. tkes 2-3 tablespoons daily for constipation      . ondansetron (ZOFRAN) 8 MG tablet Take  1 tablet (8 mg total) by mouth 2 (two) times daily. Take two times a day starting the day after chemo for 3 days. Then take two times a day as needed for nausea or vomiting.  30 tablet  1  . prochlorperazine (COMPAZINE) 10 MG tablet Take 1 tablet (10 mg total) by mouth every 6 (six) hours as needed (Nausea or vomiting).  30 tablet  1  . prochlorperazine (COMPAZINE) 25 MG suppository Place 1 suppository (25 mg total) rectally every 12 (twelve) hours as needed for nausea.  12 suppository  3  . Vitamins C E (CRANBERRY CONCENTRATE PO) Take 1 tablet by mouth 2 (two) times daily.        No current facility-administered medications for this visit.    SURGICAL HISTORY:  Past Surgical History  Procedure Laterality Date  . Nose surgery  1953  . Tonsillectomy  1955  . Nasal fracture surgery      as a child x 2  . Tonsillectomy    . Breast surgery  70's    right  . Appendectomy    . Bilateral cataract surgery    . Colonoscopy    . Neuroplasty / transposition median nerve at carpal tunnel bilateral    . Cardiac catheterization  20+yrs ago  . Breast lumpectomy with needle localization and axillary sentinel lymph node bx  02/07/2012    Procedure: BREAST LUMPECTOMY WITH NEEDLE LOCALIZATION AND AXILLARY SENTINEL LYMPH NODE BX;  Surgeon: Currie Paris, Heather Vega;  Location: [facility] OR;  Service: General;  Laterality: Right;  Right needle loc lumpectomy and Senitel Lymph Node Biopsy   . Abdominal hysterectomy      ovaries intact  . Portacath placement  03/26/2012    Procedure: INSERTION PORT-A-CATH;  Surgeon: Currie Paris, Heather Vega;  Location: [facility];  Service: General;  Laterality: Left;    REVIEW OF SYSTEMS:   General: fatigue (+), night sweats (-), fever (-), pain (-) Lymph: palpable nodes (-) HEENT: vision changes (-), mucositis (-), gum bleeding (-), epistaxis (-) Cardiovascular: chest pain (-), palpitations (-) Pulmonary: shortness of breath (-), dyspnea on exertion (-), cough  (-), hemoptysis (-) GI:  Early satiety (-), melena (-), dysphagia (-), nausea/vomiting (+), diarrhea (-) GU: dysuria (-), hematuria (-), incontinence (-) Musculoskeletal: joint swelling (-), joint pain (+), back pain (-) Neuro: weakness (-), numbness (-), headache (-), confusion (-) Skin: Rash (-), lesions (-), dryness (-) Psych: depression (-), suicidal/homicidal ideation (-), feeling of hopelessness (-)  PHYSICAL EXAMINATION: Blood pressure 146/74, pulse 103, temperature 97.7 F (36.5 C), temperature source Oral, resp.  rate 20, height 5\' 5"  (1.651 m), weight 187 lb (84.823 kg). Body mass index is 31.12 kg/(m^2). General: Patient is a well appearing female in no acute distress HEENT: PERRLA, sclerae anicteric no conjunctival pallor, MMM Neck: supple, right swollen lymph node in cervical chain, tender to palpation, approx .5 cm Lungs: clear to auscultation bilaterally, no wheezes, rhonchi, or rales Cardiovascular: regular rate rhythm, S1, S2, no murmurs, rubs or gallops Abdomen: Soft, non-tender, non-distended, normoactive bowel sounds, no HSM Extremities: warm and well perfused, no clubbing, cyanosis, or edema Skin: No rashes or lesions Neuro: Non-focal Right breast lumpectomy site without nodularity ECOG PERFORMANCE STATUS: 0 - Asymptomatic      LABORATORY DATA: Lab Results  Component Value Date   WBC 17.1* 06/22/2012   HGB 10.1* 06/22/2012   HCT 30.0* 06/22/2012   MCV 96.5 06/22/2012   PLT 199 06/22/2012      Chemistry      Component Value Date/Time   NA 141 06/15/2012 0812   NA 138 01/31/2012 1030   K 4.5 06/15/2012 0812   K 4.9 01/31/2012 1030   CL 104 06/15/2012 0812   CL 98 01/31/2012 1030   CO2 26 06/15/2012 0812   CO2 28 01/31/2012 1030   BUN 22.8 06/15/2012 0812   BUN 18 01/31/2012 1030   CREATININE 0.7 06/15/2012 0812   CREATININE 0.78 01/31/2012 1030      Component Value Date/Time   CALCIUM 9.0 06/15/2012 0812   CALCIUM 9.8 01/31/2012 1030   ALKPHOS 143 06/15/2012 0812    ALKPHOS 75 01/31/2012 1030   AST 15 06/15/2012 0812   AST 32 01/31/2012 1030   ALT 22 06/15/2012 0812   ALT 23 01/31/2012 1030   BILITOT 0.33 06/15/2012 0812   BILITOT 0.4 01/31/2012 1030       RADIOGRAPHIC STUDIES:  No results found.  ASSESSMENT: 77 year old female with  #1 stage II A. (T1 C. N1 a  cM0) invasive ductal carcinoma Measuring 1.8 cm ER positive PR positive HER-2/neu positive. One sentinel node was positive for metastatic disease.  #2 patient is a good candidate for adjuvant HER-2-based therapy. We discussed treatment with Taxotere carboplatinum and Herceptin. The Taxotere carboplatinum will be given every 3 weeks with day 2 Neulasta for a total of 6 cycles. The Herceptin will be given weekly for the duration of the chemotherapy and then changed to every 3 weeks to complete one year of Herceptin therapy.  #3 because patient has had a lumpectomy she will also require radiation therapy the patient has been seen by Dr. Ballard Russell regarding this we will refer her back to Dr. Dayton Scrape after she completes her chemotherapy.  #4 lymphadenitis  PLAN:   #1Ms. Vega will proceed with Taxotere/Carbo/Herceptin today.  I have ordered her fluids with treatment today, since they helped her so much with the last treatment.  She has f/u with Dr. Gala Romney on 4/21.    #2  Her lymphadenitis has resolved.    #3 We will see her back next week for Herceptin.    All questions were answered. The patient knows to call the clinic with any problems, questions or concerns. We can certainly see the patient much sooner if necessary.  I spent 25 minutes counseling the patient face to face. The total time spent in the appointment was 30 minutes.  Heather Ouch Lyn Hollingshead, NP Medical Oncology [facility] Phone: [phone]

## 2012-06-23 ENCOUNTER — Ambulatory Visit (HOSPITAL_BASED_OUTPATIENT_CLINIC_OR_DEPARTMENT_OTHER): Payer: Medicare Other

## 2012-06-23 VITALS — BP 148/71 | HR 87 | Temp 98.8°F | Resp 18

## 2012-06-23 DIAGNOSIS — C50419 Malignant neoplasm of upper-outer quadrant of unspecified female breast: Secondary | ICD-10-CM

## 2012-06-23 DIAGNOSIS — C50911 Malignant neoplasm of unspecified site of right female breast: Secondary | ICD-10-CM

## 2012-06-23 DIAGNOSIS — Z5189 Encounter for other specified aftercare: Secondary | ICD-10-CM

## 2012-06-23 MED ORDER — PEGFILGRASTIM INJECTION 6 MG/0.6ML
6.0000 mg | Freq: Once | SUBCUTANEOUS | Status: AC
  Administered 2012-06-23: 6 mg via SUBCUTANEOUS

## 2012-06-29 ENCOUNTER — Other Ambulatory Visit (HOSPITAL_BASED_OUTPATIENT_CLINIC_OR_DEPARTMENT_OTHER): Payer: Medicare Other | Admitting: Lab

## 2012-06-29 ENCOUNTER — Encounter: Payer: Self-pay | Admitting: Adult Health

## 2012-06-29 ENCOUNTER — Ambulatory Visit (HOSPITAL_BASED_OUTPATIENT_CLINIC_OR_DEPARTMENT_OTHER): Payer: Medicare Other | Admitting: Adult Health

## 2012-06-29 ENCOUNTER — Telehealth: Payer: Self-pay | Admitting: *Deleted

## 2012-06-29 ENCOUNTER — Ambulatory Visit: Payer: Medicare Other | Admitting: Adult Health

## 2012-06-29 ENCOUNTER — Other Ambulatory Visit: Payer: Medicare Other | Admitting: Lab

## 2012-06-29 ENCOUNTER — Ambulatory Visit (HOSPITAL_BASED_OUTPATIENT_CLINIC_OR_DEPARTMENT_OTHER): Payer: Medicare Other

## 2012-06-29 VITALS — BP 131/77 | HR 96 | Temp 98.5°F | Resp 20 | Ht 65.0 in | Wt 181.1 lb

## 2012-06-29 DIAGNOSIS — C50419 Malignant neoplasm of upper-outer quadrant of unspecified female breast: Secondary | ICD-10-CM

## 2012-06-29 DIAGNOSIS — Z5112 Encounter for antineoplastic immunotherapy: Secondary | ICD-10-CM

## 2012-06-29 DIAGNOSIS — C50911 Malignant neoplasm of unspecified site of right female breast: Secondary | ICD-10-CM

## 2012-06-29 DIAGNOSIS — Z170 Estrogen receptor positive status [ER+]: Secondary | ICD-10-CM

## 2012-06-29 DIAGNOSIS — E860 Dehydration: Secondary | ICD-10-CM

## 2012-06-29 LAB — CBC WITH DIFFERENTIAL/PLATELET
BASO%: 0.3 % (ref 0.0–2.0)
Eosinophils Absolute: 0.1 10*3/uL (ref 0.0–0.5)
HCT: 32.8 % — ABNORMAL LOW (ref 34.8–46.6)
MCHC: 33.5 g/dL (ref 31.5–36.0)
MONO#: 1.5 10*3/uL — ABNORMAL HIGH (ref 0.1–0.9)
NEUT#: 9.9 10*3/uL — ABNORMAL HIGH (ref 1.5–6.5)
RBC: 3.39 10*6/uL — ABNORMAL LOW (ref 3.70–5.45)
WBC: 15.3 10*3/uL — ABNORMAL HIGH (ref 3.9–10.3)
lymph#: 3.9 10*3/uL — ABNORMAL HIGH (ref 0.9–3.3)

## 2012-06-29 LAB — COMPREHENSIVE METABOLIC PANEL (CC13)
ALT: 29 U/L (ref 0–55)
AST: 21 U/L (ref 5–34)
Albumin: 3.6 g/dL (ref 3.5–5.0)
Calcium: 8.7 mg/dL (ref 8.4–10.4)
Chloride: 96 mEq/L — ABNORMAL LOW (ref 98–107)
Creatinine: 0.7 mg/dL (ref 0.6–1.1)
Potassium: 3.8 mEq/L (ref 3.5–5.1)

## 2012-06-29 MED ORDER — SODIUM CHLORIDE 0.9 % IJ SOLN
10.0000 mL | INTRAMUSCULAR | Status: DC | PRN
  Administered 2012-06-29: 10 mL
  Filled 2012-06-29: qty 10

## 2012-06-29 MED ORDER — TRASTUZUMAB CHEMO INJECTION 440 MG
2.0000 mg/kg | Freq: Once | INTRAVENOUS | Status: AC
  Administered 2012-06-29: 168 mg via INTRAVENOUS
  Filled 2012-06-29: qty 8

## 2012-06-29 MED ORDER — SODIUM CHLORIDE 0.9 % IV SOLN: Freq: Once | INTRAVENOUS | Status: AC

## 2012-06-29 MED ORDER — DIPHENHYDRAMINE HCL 25 MG PO CAPS
25.0000 mg | ORAL_CAPSULE | Freq: Once | ORAL | Status: AC
  Administered 2012-06-29: 25 mg via ORAL

## 2012-06-29 MED ORDER — SODIUM CHLORIDE 0.9 % IV SOLN
Freq: Once | INTRAVENOUS | Status: DC
  Administered 2012-06-29: 15:00:00 via INTRAVENOUS

## 2012-06-29 MED ORDER — HEPARIN SOD (PORK) LOCK FLUSH 100 UNIT/ML IV SOLN
500.0000 [IU] | Freq: Once | INTRAVENOUS | Status: AC | PRN
  Administered 2012-06-29: 500 [IU]
  Filled 2012-06-29: qty 5

## 2012-06-29 MED ORDER — ACETAMINOPHEN 325 MG PO TABS
650.0000 mg | ORAL_TABLET | Freq: Once | ORAL | Status: AC
  Administered 2012-06-29: 650 mg via ORAL

## 2012-06-29 NOTE — Progress Notes (Signed)
OFFICE PROGRESS NOTE  CC  [facility], MD [address] [place] [address] Dr. Cyndia Bent Dr. Chipper Herb  DIAGNOSIS: 77 year old female with new diagnosis of invasive ductal carcinoma that is ER positive PR positive HER-2/neu positive by [facility] Testing  PRIOR THERAPY:  #1 patient was originally seenBy me on 02/21/2012 when she was diagnosed with invasive ductal carcinoma of the right breast stage II a with a positive lymph node. She underwent a lumpectomy and sentinel lymph node biopsy.The final pathology revealed a 1.8 cm invasive ductal carcinoma with low grade ductal carcinoma in situ the tumor was grade 2 ER positive PR positive Ki-67 was 11%. HER-2/neu was equivocal by cish.  #2 she had a sentinel lymph node biopsy one node was positive for metastatic disease. Making her a stage II A.  #3 patient and I and her family had an extensive discussion at the original meeting and we sent her tumor for HER-2/neu testing by [facility]. By [facility]  HER-2/neu is positive.  #4 patient is now going to be receiving combination chemotherapy consisting of Taxotere carboplatinum and Herceptin Beginning 03/30/2012 She will receive TCH combination every 21 days. She will receive day 2 Neulasta when she receives [facility]. On other weeks she will receive weekly Herceptin. A total of 6 cycles Of TCH is planned. Once she completes this she will then proceed with radiation therapy and we will plan on changing her Herceptin to every 3 weeks to complete out one year of therapy.  CURRENT THERAPY: Cycle 5 day 8 of TCH With day 2 Neulasta, and weekly herceptin  INTERVAL HISTORY: Heather Vega 77 y.o. female returns for followup visit today. She's been achy and rotten feeling for the past few days.  This started three days ago.  She denies fevers, chills, nausea, vomiting, headaches, diarrhea.  She has been constipated, took Miralax which eventually worked, and now her bowels are regular.  Otherwise  a 10 point ROS is neg.   MEDICAL HISTORY: Past Medical History  Diagnosis Date  . Hyperlipidemia     doesn't require meds  . Asthma     pt states its "not bad"  . Back pain     buldging disc and pt says 2 are "flat"  . Hemorrhoids   . History of colon polyps   . Diverticulosis   . Cancer     breast right  . Hard of hearing   . Diabetes mellitus without complication     takes Metformin daily,Type II  . Allergy   . Cataract     b/l surgery  . Hypothyroidism     hx of;but has been off of meds >12yrs ago  . Wears glasses   . Hypertension     history, was on b/p  meds years ago  . PONV (postoperative nausea and vomiting)     ALLERGIES:  has No Known Allergies.  MEDICATIONS:  Current Outpatient Prescriptions  Medication Sig Dispense Refill  . aspirin 81 MG tablet Take 81 mg by mouth daily.      . B Complex-C (SUPER B COMPLEX PO) Take 1 capsule by mouth daily.      . carvedilol (COREG) 3.125 MG tablet Take 1 tablet (3.125 mg total) by mouth 2 (two) times daily with a meal.  60 tablet  3  . CINNAMON PO Take 1 tablet by mouth 2 (two) times daily.       Marland Kitchen dexamethasone (DECADRON) 4 MG tablet Take 2 tablets (8 mg total) by mouth  2 (two) times daily with a meal. Take two times a day the day before Taxotere. Then take two times a day starting the day after chemo for 3 days.  60 tablet  6  . lidocaine-prilocaine (EMLA) cream Apply topically as needed.  30 g  7  . LORazepam (ATIVAN) 0.5 MG tablet Take 1 tablet (0.5 mg total) by mouth every 6 (six) hours as needed (Nausea or vomiting).  30 tablet  0  . metFORMIN (GLUCOPHAGE) 1000 MG tablet Take 1,000 mg by mouth 2 (two) times daily with a meal.      . Multiple Vitamins-Minerals (MULTIVITAMIN WITH MINERALS) tablet Take 1 tablet by mouth daily.      . naproxen sodium (ANAPROX) 220 MG tablet Take 220 mg by mouth as needed.      . Nutritional Supplements (FIBER FORMULA) POWD Take 30 mLs by mouth daily. tkes 2-3 tablespoons daily for  constipation      . ondansetron (ZOFRAN) 8 MG tablet Take 1 tablet (8 mg total) by mouth 2 (two) times daily. Take two times a day starting the day after chemo for 3 days. Then take two times a day as needed for nausea or vomiting.  30 tablet  1  . prochlorperazine (COMPAZINE) 10 MG tablet Take 1 tablet (10 mg total) by mouth every 6 (six) hours as needed (Nausea or vomiting).  30 tablet  1  . prochlorperazine (COMPAZINE) 25 MG suppository Place 1 suppository (25 mg total) rectally every 12 (twelve) hours as needed for nausea.  12 suppository  3  . Vitamins C E (CRANBERRY CONCENTRATE PO) Take 1 tablet by mouth 2 (two) times daily.        No current facility-administered medications for this visit.    SURGICAL HISTORY:  Past Surgical History  Procedure Laterality Date  . Nose surgery  1953  . Tonsillectomy  1955  . Nasal fracture surgery      as a child x 2  . Tonsillectomy    . Breast surgery  70's    right  . Appendectomy    . Bilateral cataract surgery    . Colonoscopy    . Neuroplasty / transposition median nerve at carpal tunnel bilateral    . Cardiac catheterization  20+yrs ago  . Breast lumpectomy with needle localization and axillary sentinel lymph node bx  02/07/2012    Procedure: BREAST LUMPECTOMY WITH NEEDLE LOCALIZATION AND AXILLARY SENTINEL LYMPH NODE BX;  Surgeon: Currie Paris, MD;  Location: [facility] OR;  Service: General;  Laterality: Right;  Right needle loc lumpectomy and Senitel Lymph Node Biopsy   . Abdominal hysterectomy      ovaries intact  . Portacath placement  03/26/2012    Procedure: INSERTION PORT-A-CATH;  Surgeon: Currie Paris, MD;  Location: [facility];  Service: General;  Laterality: Left;    REVIEW OF SYSTEMS:   General: fatigue (+), night sweats (-), fever (-), pain (-) Lymph: palpable nodes (-) HEENT: vision changes (-), mucositis (-), gum bleeding (-), epistaxis (-) Cardiovascular: chest pain (-), palpitations (-) Pulmonary:  shortness of breath (-), dyspnea on exertion (-), cough (-), hemoptysis (-) GI:  Early satiety (-), melena (-), dysphagia (-), nausea/vomiting (-), diarrhea (-) GU: dysuria (-), hematuria (-), incontinence (-) Musculoskeletal: joint swelling (-), joint pain (+), back pain (-) Neuro: weakness (-), numbness (-), headache (-), confusion (-) Skin: Rash (-), lesions (-), dryness (-) Psych: depression (-), suicidal/homicidal ideation (-), feeling of hopelessness (-)  PHYSICAL EXAMINATION: Blood  pressure 131/77, pulse 96, temperature 98.5 F (36.9 C), temperature source Oral, resp. rate 20, height 5\' 5"  (1.651 m), weight 181 lb 1.6 oz (82.146 kg). Body mass index is 30.14 kg/(m^2). General: Patient is a well appearing female in no acute distress HEENT: PERRLA, sclerae anicteric no conjunctival pallor, MMM Neck: supple, right swollen lymph node in cervical chain, tender to palpation, approx .5 cm Lungs: clear to auscultation bilaterally, no wheezes, rhonchi, or rales Cardiovascular: regular rate rhythm, S1, S2, no murmurs, rubs or gallops Abdomen: Soft, non-tender, non-distended, normoactive bowel sounds, no HSM Extremities: warm and well perfused, no clubbing, cyanosis, or edema Skin: No rashes or lesions Neuro: Non-focal Right breast lumpectomy site without nodularity ECOG PERFORMANCE STATUS: 0 - Asymptomatic      LABORATORY DATA: Lab Results  Component Value Date   WBC 15.3* 06/29/2012   HGB 11.0* 06/29/2012   HCT 32.8* 06/29/2012   MCV 96.8 06/29/2012   PLT 323 06/29/2012      Chemistry      Component Value Date/Time   NA 139 06/22/2012 0808   NA 138 01/31/2012 1030   K 4.1 06/22/2012 0808   K 4.9 01/31/2012 1030   CL 102 06/22/2012 0808   CL 98 01/31/2012 1030   CO2 24 06/22/2012 0808   CO2 28 01/31/2012 1030   BUN 21.2 06/22/2012 0808   BUN 18 01/31/2012 1030   CREATININE 0.8 06/22/2012 0808   CREATININE 0.78 01/31/2012 1030      Component Value Date/Time   CALCIUM 9.1 06/22/2012 0808    CALCIUM 9.8 01/31/2012 1030   ALKPHOS 107 06/22/2012 0808   ALKPHOS 75 01/31/2012 1030   AST 13 06/22/2012 0808   AST 32 01/31/2012 1030   ALT 20 06/22/2012 0808   ALT 23 01/31/2012 1030   BILITOT 0.31 06/22/2012 0808   BILITOT 0.4 01/31/2012 1030       RADIOGRAPHIC STUDIES:  No results found.  ASSESSMENT: 77 year old female with  #1 stage II A. (T1 C. N1 a  cM0) invasive ductal carcinoma Measuring 1.8 cm ER positive PR positive HER-2/neu positive. One sentinel node was positive for metastatic disease.  #2 patient is a good candidate for adjuvant HER-2-based therapy. We discussed treatment with Taxotere carboplatinum and Herceptin. The Taxotere carboplatinum will be given every 3 weeks with day 2 Neulasta for a total of 6 cycles. The Herceptin will be given weekly for the duration of the chemotherapy and then changed to every 3 weeks to complete one year of Herceptin therapy.  #3 because patient has had a lumpectomy she will also require radiation therapy the patient has been seen by Dr. Ballard Russell regarding this we will refer her back to Dr. Dayton Scrape after she completes her chemotherapy.    PLAN:   #1Ms. Vega will proceed with Herceptin today. She's feeling slightly better today.  We will give IV fluids, NS 1L x 1 today for dehydration.   #2 We will see her back next week for Herceptin.    All questions were answered. The patient knows to call the clinic with any problems, questions or concerns. We can certainly see the patient much sooner if necessary.  I spent 25 minutes counseling the patient face to face. The total time spent in the appointment was 30 minutes.  Cherie Ouch Lyn Hollingshead, NP Medical Oncology [facility] Phone: [phone]

## 2012-06-29 NOTE — Telephone Encounter (Signed)
sw scheduler for Dr. Dayton Scrape office she gv a d/t for the pt. However, she did not  put it in the system. i called back asking about the appt she stated" that she did not have time to put it in because everyone is around her." however, i wrote it on the pts appt sheet for the d/t that was given to me over the phone.

## 2012-06-29 NOTE — Patient Instructions (Addendum)
 Cancer Center Discharge Instructions for Patients Receiving Chemotherapy  Today you received the following chemotherapy agents Herceptin   BELOW ARE SYMPTOMS THAT SHOULD BE REPORTED IMMEDIATELY:  *FEVER GREATER THAN 100.5 F  *CHILLS WITH OR WITHOUT FEVER  NAUSEA AND VOMITING THAT IS NOT CONTROLLED WITH YOUR NAUSEA MEDICATION  *UNUSUAL SHORTNESS OF BREATH  *UNUSUAL BRUISING OR BLEEDING  TENDERNESS IN MOUTH AND THROAT WITH OR WITHOUT PRESENCE OF ULCERS  *URINARY PROBLEMS  *BOWEL PROBLEMS  UNUSUAL RASH Items with * indicate a potential emergency and should be followed up as soon as possible.   Feel free to call the clinic you have any questions or concerns. The clinic phone number is [phone].   I have been informed and understand all the instructions given to me. I know to contact the clinic, my physician, or go to the Emergency Department if any problems should occur. I do not have any questions at this time, but understand that I may call the clinic during office hours   should I have any questions or need assistance in obtaining follow up care.

## 2012-06-29 NOTE — Patient Instructions (Addendum)
Doing well.  Proceed with Herceptin and IV fluids.  We will refer you back to see Dr. Dayton Scrape towards end of April/early May for radiation therapy consulation.

## 2012-07-05 ENCOUNTER — Other Ambulatory Visit: Payer: Self-pay | Admitting: Emergency Medicine

## 2012-07-05 DIAGNOSIS — C50911 Malignant neoplasm of unspecified site of right female breast: Secondary | ICD-10-CM

## 2012-07-06 ENCOUNTER — Other Ambulatory Visit (HOSPITAL_BASED_OUTPATIENT_CLINIC_OR_DEPARTMENT_OTHER): Payer: Medicare Other | Admitting: Lab

## 2012-07-06 ENCOUNTER — Ambulatory Visit (HOSPITAL_BASED_OUTPATIENT_CLINIC_OR_DEPARTMENT_OTHER): Payer: Medicare Other | Admitting: Adult Health

## 2012-07-06 ENCOUNTER — Encounter: Payer: Self-pay | Admitting: Adult Health

## 2012-07-06 ENCOUNTER — Ambulatory Visit (HOSPITAL_BASED_OUTPATIENT_CLINIC_OR_DEPARTMENT_OTHER): Payer: Medicare Other

## 2012-07-06 VITALS — BP 137/74 | HR 91 | Temp 98.4°F | Resp 20 | Ht 65.0 in | Wt 184.0 lb

## 2012-07-06 DIAGNOSIS — G589 Mononeuropathy, unspecified: Secondary | ICD-10-CM

## 2012-07-06 DIAGNOSIS — C773 Secondary and unspecified malignant neoplasm of axilla and upper limb lymph nodes: Secondary | ICD-10-CM

## 2012-07-06 DIAGNOSIS — C50911 Malignant neoplasm of unspecified site of right female breast: Secondary | ICD-10-CM

## 2012-07-06 DIAGNOSIS — G629 Polyneuropathy, unspecified: Secondary | ICD-10-CM

## 2012-07-06 DIAGNOSIS — C50419 Malignant neoplasm of upper-outer quadrant of unspecified female breast: Secondary | ICD-10-CM

## 2012-07-06 DIAGNOSIS — Z170 Estrogen receptor positive status [ER+]: Secondary | ICD-10-CM

## 2012-07-06 DIAGNOSIS — Z5112 Encounter for antineoplastic immunotherapy: Secondary | ICD-10-CM

## 2012-07-06 LAB — CBC WITH DIFFERENTIAL/PLATELET
BASO%: 0.2 % (ref 0.0–2.0)
EOS%: 0.2 % (ref 0.0–7.0)
HCT: 32.3 % — ABNORMAL LOW (ref 34.8–46.6)
MCH: 32.5 pg (ref 25.1–34.0)
MCHC: 33.1 g/dL (ref 31.5–36.0)
MONO%: 4.6 % (ref 0.0–14.0)
NEUT%: 82 % — ABNORMAL HIGH (ref 38.4–76.8)
lymph#: 2.1 10*3/uL (ref 0.9–3.3)

## 2012-07-06 LAB — COMPREHENSIVE METABOLIC PANEL (CC13)
ALT: 20 U/L (ref 0–55)
AST: 18 U/L (ref 5–34)
Albumin: 3.5 g/dL (ref 3.5–5.0)
BUN: 17.6 mg/dL (ref 7.0–26.0)
CO2: 26 mEq/L (ref 22–29)
Calcium: 8.5 mg/dL (ref 8.4–10.4)
Chloride: 100 mEq/L (ref 98–107)
Creatinine: 0.7 mg/dL (ref 0.6–1.1)
Potassium: 3.8 mEq/L (ref 3.5–5.1)

## 2012-07-06 MED ORDER — SODIUM CHLORIDE 0.9 % IJ SOLN
10.0000 mL | INTRAMUSCULAR | Status: DC | PRN
  Administered 2012-07-06: 10 mL
  Filled 2012-07-06: qty 10

## 2012-07-06 MED ORDER — HEPARIN SOD (PORK) LOCK FLUSH 100 UNIT/ML IV SOLN
500.0000 [IU] | Freq: Once | INTRAVENOUS | Status: AC | PRN
  Administered 2012-07-06: 500 [IU]
  Filled 2012-07-06: qty 5

## 2012-07-06 MED ORDER — TRASTUZUMAB CHEMO INJECTION 440 MG
2.0000 mg/kg | Freq: Once | INTRAVENOUS | Status: AC
  Administered 2012-07-06: 168 mg via INTRAVENOUS
  Filled 2012-07-06: qty 8

## 2012-07-06 MED ORDER — SODIUM CHLORIDE 0.9 % IV SOLN
Freq: Once | INTRAVENOUS | Status: AC
  Administered 2012-07-06: 13:00:00 via INTRAVENOUS

## 2012-07-06 MED ORDER — ACETAMINOPHEN 325 MG PO TABS
650.0000 mg | ORAL_TABLET | Freq: Once | ORAL | Status: AC
  Administered 2012-07-06: 650 mg via ORAL

## 2012-07-06 MED ORDER — GABAPENTIN 100 MG PO CAPS: 100.0000 mg | ORAL_CAPSULE | Freq: Three times a day (TID) | ORAL | Status: DC

## 2012-07-06 MED ORDER — DIPHENHYDRAMINE HCL 25 MG PO CAPS
25.0000 mg | ORAL_CAPSULE | Freq: Once | ORAL | Status: AC
  Administered 2012-07-06: 25 mg via ORAL

## 2012-07-06 NOTE — Progress Notes (Signed)
OFFICE PROGRESS NOTE  CC  [facility], MD [address] [place] [address] Dr. Cyndia Bent Dr. Chipper Herb  DIAGNOSIS: 77 year old female with new diagnosis of invasive ductal carcinoma that is ER positive PR positive HER-2/neu positive by [facility] Testing  PRIOR THERAPY:  #1 patient was originally seenBy me on 02/21/2012 when she was diagnosed with invasive ductal carcinoma of the right breast stage II a with a positive lymph node. She underwent a lumpectomy and sentinel lymph node biopsy.The final pathology revealed a 1.8 cm invasive ductal carcinoma with low grade ductal carcinoma in situ the tumor was grade 2 ER positive PR positive Ki-67 was 11%. HER-2/neu was equivocal by cish.  #2 she had a sentinel lymph node biopsy one node was positive for metastatic disease. Making her a stage II A.  #3 patient and I and her family had an extensive discussion at the original meeting and we sent her tumor for HER-2/neu testing by [facility]. By [facility]  HER-2/neu is positive.  #4 patient is now going to be receiving combination chemotherapy consisting of Taxotere carboplatinum and Herceptin Beginning 03/30/2012 She will receive TCH combination every 21 days. She will receive day 2 Neulasta when she receives [facility]. On other weeks she will receive weekly Herceptin. A total of 6 cycles Of TCH is planned. Once she completes this she will then proceed with radiation therapy and we will plan on changing her Herceptin to every 3 weeks to complete out one year of therapy.  CURRENT THERAPY: Cycle 5 day 15 of TCH With day 2 Neulasta, and weekly herceptin  INTERVAL HISTORY: Heather Vega 77 y.o. female returns for followup visit today. She felt very poor Friday night and Saturday.  She ended up feeling constipated, took Ativan and Compazine, and slept.  She did have a bowel movement, and has since felt better.  She has mild numbness in her toes, and pain in her feet.  She denies numbness in  her fingertips.  Otherwise she denies fevers, chills, diarrhea, vomiting.   MEDICAL HISTORY: Past Medical History  Diagnosis Date  . Hyperlipidemia     doesn't require meds  . Asthma     pt states its "not bad"  . Back pain     buldging disc and pt says 2 are "flat"  . Hemorrhoids   . History of colon polyps   . Diverticulosis   . Cancer     breast right  . Hard of hearing   . Diabetes mellitus without complication     takes Metformin daily,Type II  . Allergy   . Cataract     b/l surgery  . Hypothyroidism     hx of;but has been off of meds >34yrs ago  . Wears glasses   . Hypertension     history, was on b/p  meds years ago  . PONV (postoperative nausea and vomiting)     ALLERGIES:  has No Known Allergies.  MEDICATIONS:  Current Outpatient Prescriptions  Medication Sig Dispense Refill  . aspirin 81 MG tablet Take 81 mg by mouth daily.      . B Complex-C (SUPER B COMPLEX PO) Take 1 capsule by mouth daily.      . carvedilol (COREG) 3.125 MG tablet Take 1 tablet (3.125 mg total) by mouth 2 (two) times daily with a meal.  60 tablet  3  . CINNAMON PO Take 1 tablet by mouth 2 (two) times daily.       Marland Kitchen dexamethasone (DECADRON) 4  MG tablet Take 2 tablets (8 mg total) by mouth 2 (two) times daily with a meal. Take two times a day the day before Taxotere. Then take two times a day starting the day after chemo for 3 days.  60 tablet  6  . lidocaine-prilocaine (EMLA) cream Apply topically as needed.  30 g  7  . LORazepam (ATIVAN) 0.5 MG tablet Take 1 tablet (0.5 mg total) by mouth every 6 (six) hours as needed (Nausea or vomiting).  30 tablet  0  . metFORMIN (GLUCOPHAGE) 1000 MG tablet Take 1,000 mg by mouth 2 (two) times daily with a meal.      . Multiple Vitamins-Minerals (MULTIVITAMIN WITH MINERALS) tablet Take 1 tablet by mouth daily.      . naproxen sodium (ANAPROX) 220 MG tablet Take 220 mg by mouth as needed.      . Nutritional Supplements (FIBER FORMULA) POWD Take 30 mLs by  mouth daily. tkes 2-3 tablespoons daily for constipation      . ondansetron (ZOFRAN) 8 MG tablet Take 1 tablet (8 mg total) by mouth 2 (two) times daily. Take two times a day starting the day after chemo for 3 days. Then take two times a day as needed for nausea or vomiting.  30 tablet  1  . prochlorperazine (COMPAZINE) 10 MG tablet Take 1 tablet (10 mg total) by mouth every 6 (six) hours as needed (Nausea or vomiting).  30 tablet  1  . prochlorperazine (COMPAZINE) 25 MG suppository Place 1 suppository (25 mg total) rectally every 12 (twelve) hours as needed for nausea.  12 suppository  3  . Vitamins C E (CRANBERRY CONCENTRATE PO) Take 1 tablet by mouth 2 (two) times daily.        No current facility-administered medications for this visit.    SURGICAL HISTORY:  Past Surgical History  Procedure Laterality Date  . Nose surgery  1953  . Tonsillectomy  1955  . Nasal fracture surgery      as a child x 2  . Tonsillectomy    . Breast surgery  70's    right  . Appendectomy    . Bilateral cataract surgery    . Colonoscopy    . Neuroplasty / transposition median nerve at carpal tunnel bilateral    . Cardiac catheterization  20+yrs ago  . Breast lumpectomy with needle localization and axillary sentinel lymph node bx  02/07/2012    Procedure: BREAST LUMPECTOMY WITH NEEDLE LOCALIZATION AND AXILLARY SENTINEL LYMPH NODE BX;  Surgeon: Currie Paris, MD;  Location: [facility] OR;  Service: General;  Laterality: Right;  Right needle loc lumpectomy and Senitel Lymph Node Biopsy   . Abdominal hysterectomy      ovaries intact  . Portacath placement  03/26/2012    Procedure: INSERTION PORT-A-CATH;  Surgeon: Currie Paris, MD;  Location: [facility];  Service: General;  Laterality: Left;    REVIEW OF SYSTEMS:   General: fatigue (+), night sweats (-), fever (-), pain (-) Lymph: palpable nodes (-) HEENT: vision changes (-), mucositis (-), gum bleeding (-), epistaxis (-) Cardiovascular:  chest pain (-), palpitations (-) Pulmonary: shortness of breath (-), dyspnea on exertion (-), cough (-), hemoptysis (-) GI:  Early satiety (-), melena (-), dysphagia (-), nausea/vomiting (-), diarrhea (-) GU: dysuria (-), hematuria (-), incontinence (-) Musculoskeletal: joint swelling (-), joint pain (+), back pain (-) Neuro: weakness (-), numbness (-), headache (-), confusion (-) Skin: Rash (-), lesions (-), dryness (-) Psych: depression (-), suicidal/homicidal  ideation (-), feeling of hopelessness (-)  PHYSICAL EXAMINATION: Blood pressure 137/74, pulse 91, temperature 98.4 F (36.9 C), temperature source Oral, resp. rate 20, height 5\' 5"  (1.651 m), weight 184 lb (83.462 kg). Body mass index is 30.62 kg/(m^2). General: Patient is a well appearing female in no acute distress HEENT: PERRLA, sclerae anicteric no conjunctival pallor, MMM Neck: supple, right swollen lymph node in cervical chain, tender to palpation, approx .5 cm Lungs: clear to auscultation bilaterally, no wheezes, rhonchi, or rales Cardiovascular: regular rate rhythm, S1, S2, no murmurs, rubs or gallops Abdomen: Soft, non-tender, non-distended, normoactive bowel sounds, no HSM Extremities: warm and well perfused, no clubbing, cyanosis, or edema Skin: No rashes or lesions Neuro: Non-focal Right breast lumpectomy site without nodularity ECOG PERFORMANCE STATUS: 0 - Asymptomatic      LABORATORY DATA: Lab Results  Component Value Date   WBC 16.3* 07/06/2012   HGB 10.7* 07/06/2012   HCT 32.3* 07/06/2012   MCV 98.2 07/06/2012   PLT 159 07/06/2012      Chemistry      Component Value Date/Time   NA 136 06/29/2012 1250   NA 138 01/31/2012 1030   K 3.8 06/29/2012 1250   K 4.9 01/31/2012 1030   CL 96* 06/29/2012 1250   CL 98 01/31/2012 1030   CO2 30* 06/29/2012 1250   CO2 28 01/31/2012 1030   BUN 24.1 06/29/2012 1250   BUN 18 01/31/2012 1030   CREATININE 0.7 06/29/2012 1250   CREATININE 0.78 01/31/2012 1030      Component Value  Date/Time   CALCIUM 8.7 06/29/2012 1250   CALCIUM 9.8 01/31/2012 1030   ALKPHOS 138 06/29/2012 1250   ALKPHOS 75 01/31/2012 1030   AST 21 06/29/2012 1250   AST 32 01/31/2012 1030   ALT 29 06/29/2012 1250   ALT 23 01/31/2012 1030   BILITOT 0.38 06/29/2012 1250   BILITOT 0.4 01/31/2012 1030       RADIOGRAPHIC STUDIES:  No results found.  ASSESSMENT: 77 year old female with  #1 stage II A. (T1 C. N1 a  cM0) invasive ductal carcinoma Measuring 1.8 cm ER positive PR positive HER-2/neu positive. One sentinel node was positive for metastatic disease.  #2 patient is a good candidate for adjuvant HER-2-based therapy. We discussed treatment with Taxotere carboplatinum and Herceptin. The Taxotere carboplatinum will be given every 3 weeks with day 2 Neulasta for a total of 6 cycles. The Herceptin will be given weekly for the duration of the chemotherapy and then changed to every 3 weeks to complete one year of Herceptin therapy.  #3 because patient has had a lumpectomy she will also require radiation therapy the patient has been seen by Dr. Ballard Russell regarding this we will refer her back to Dr. Dayton Scrape after she completes her chemotherapy.  #4 Neuropathy  PLAN:   #1Ms. Arsenau will proceed with Herceptin today. She's feeling better today. She has f/u with Dr. Gala Romney on 07/16/12.    #2 We will see her back next week for her sixth cycle of TCH.  She does have f/u with Dr. Dayton Scrape on 07/25/12.    #3 I will prescribe Gabapentin 100mg  TID for the numbness.    All questions were answered. The patient knows to call the clinic with any problems, questions or concerns. We can certainly see the patient much sooner if necessary.  I spent 25 minutes counseling the patient face to face. The total time spent in the appointment was 30 minutes.  Cherie Ouch Lyn Hollingshead, NP Medical  Oncology [facility] Phone: [phone]

## 2012-07-06 NOTE — Patient Instructions (Addendum)
[facility] Discharge Instructions for Patients Receiving Chemotherapy  Today you received the following chemotherapy agents :Herceptin To help prevent nausea and vomiting after your treatment, we encourage you to take your nausea medication. If you develop nausea and vomiting that is not controlled by your nausea medication, call the clinic. If it is after clinic hours, call your family physician or the after hours number for the clinic or go to the Emergency Department.   BELOW ARE SYMPTOMS THAT SHOULD BE REPORTED IMMEDIATELY:  *FEVER GREATER THAN 100.5 F  *CHILLS WITH OR WITHOUT FEVER  NAUSEA AND VOMITING THAT IS NOT CONTROLLED WITH YOUR NAUSEA MEDICATION  *UNUSUAL SHORTNESS OF BREATH  *UNUSUAL BRUISING OR BLEEDING  TENDERNESS IN MOUTH AND THROAT WITH OR WITHOUT PRESENCE OF ULCERS  *URINARY PROBLEMS  *BOWEL PROBLEMS  UNUSUAL RASH Items with * indicate a potential emergency and should be followed up as soon as possible.  The clinic phone number is [phone].

## 2012-07-06 NOTE — Patient Instructions (Signed)
Gabapentin capsules or tablets What is this medicine? GABAPENTIN (GA ba pen tin) is used to control partial seizures in adults with epilepsy. It is also used to treat certain types of nerve pain. This medicine may be used for other purposes; ask your health care provider or pharmacist if you have questions. What should I tell my health care provider before I take this medicine? They need to know if you have any of these conditions: -kidney disease -suicidal thoughts, plans, or attempt; a previous suicide attempt by you or a family member -an unusual or allergic reaction to gabapentin, other medicines, foods, dyes, or preservatives -pregnant or trying to get pregnant -breast-feeding How should I use this medicine? Take this medicine by mouth. Swallow it with a drink of water. Follow the directions on the prescription label. If this medicine upsets your stomach, take it with food or milk. Take your medicine at regular intervals. Do not take it more often than directed. If you are directed to break the 600 or 800 mg tablets in half as part of your dose, the extra half tablet should be used for the next dose. If you have not used the extra half tablet within 3 days, it should be thrown away. A special MedGuide will be given to you by the pharmacist with each prescription and refill. Be sure to read this information carefully each time. Talk to your pediatrician regarding the use of this medicine in children. Special care may be needed. Overdosage: If you think you have taken too much of this medicine contact a poison control center or emergency room at once. NOTE: This medicine is only for you. Do not share this medicine with others. What if I miss a dose? If you miss a dose, take it as soon as you can. If it is almost time for your next dose, take only that dose. Do not take double or extra doses. What may interact with this medicine? -antacids -hydrocodone -morphine -naproxen -sevelamer This  list may not describe all possible interactions. Give your health care provider a list of all the medicines, herbs, non-prescription drugs, or dietary supplements you use. Also tell them if you smoke, drink alcohol, or use illegal drugs. Some items may interact with your medicine. What should I watch for while using this medicine? Visit your doctor or health care professional for regular checks on your progress. You may want to keep a record at home of how you feel your condition is responding to treatment. You may want to share this information with your doctor or health care professional at each visit. You should contact your doctor or health care professional if your seizures get worse or if you have any new types of seizures. Do not stop taking this medicine or any of your seizure medicines unless instructed by your doctor or health care professional. Stopping your medicine suddenly can increase your seizures or their severity. Wear a medical identification bracelet or chain if you are taking this medicine for seizures, and carry a card that lists all your medications. You may get drowsy, dizzy, or have blurred vision. Do not drive, use machinery, or do anything that needs mental alertness until you know how this medicine affects you. To reduce dizzy or fainting spells, do not sit or stand up quickly, especially if you are an older patient. Alcohol can increase drowsiness and dizziness. Avoid alcoholic drinks. Your mouth may get dry. Chewing sugarless gum or sucking hard candy, and drinking plenty of water will help.   The use of this medicine may increase the chance of suicidal thoughts or actions. Pay special attention to how you are responding while on this medicine. Any worsening of mood, or thoughts of suicide or dying should be reported to your health care professional right away. Women who become pregnant while using this medicine may enroll in the [place] Antiepileptic Drug Pregnancy Registry  by calling 1-888-233-2334. This registry collects information about the safety of antiepileptic drug use during pregnancy. What side effects may I notice from receiving this medicine? Side effects that you should report to your doctor or health care professional as soon as possible: -allergic reactions like skin rash, itching or hives, swelling of the face, lips, or tongue -worsening of mood, thoughts or actions of suicide or dying Side effects that usually do not require medical attention (report to your doctor or health care professional if they continue or are bothersome): -constipation -difficulty walking or controlling muscle movements -nausea -slurred speech -tremors -weight gain This list may not describe all possible side effects. Call your doctor for medical advice about side effects. You may report side effects to FDA at 1-800-FDA-1088. Where should I keep my medicine? Keep out of reach of children. Store at room temperature between 15 and 30 degrees C (59 and 86 degrees F). Throw away any unused medicine after the expiration date. NOTE: This sheet is a summary. It may not cover all possible information. If you have questions about this medicine, talk to your doctor, pharmacist, or health care provider.  2012, [employer]vier/Gold Standard. (11/10/2009 6:06:26 PM) 

## 2012-07-11 ENCOUNTER — Telehealth: Payer: Self-pay | Admitting: Emergency Medicine

## 2012-07-11 NOTE — Telephone Encounter (Signed)
Patient called stating she has been experiencing blurred vision, itching all over, and continued numbness on right hand and right leg. She states she also has bilateral lower extremity swelling; worse on the right. Patient has stopped taking the Neurontin on 4/15. Patient instructed to keep lower extremities elevated and to call with any further concerns and to keep all future appointments. Patient denies and worsening in motor functions at this time.

## 2012-07-12 ENCOUNTER — Other Ambulatory Visit: Payer: Self-pay | Admitting: Emergency Medicine

## 2012-07-12 DIAGNOSIS — C50911 Malignant neoplasm of unspecified site of right female breast: Secondary | ICD-10-CM

## 2012-07-13 ENCOUNTER — Encounter: Payer: Self-pay | Admitting: Adult Health

## 2012-07-13 ENCOUNTER — Other Ambulatory Visit: Payer: Medicare Other | Admitting: Lab

## 2012-07-13 ENCOUNTER — Ambulatory Visit (HOSPITAL_BASED_OUTPATIENT_CLINIC_OR_DEPARTMENT_OTHER): Payer: Medicare Other | Admitting: Adult Health

## 2012-07-13 ENCOUNTER — Ambulatory Visit (HOSPITAL_BASED_OUTPATIENT_CLINIC_OR_DEPARTMENT_OTHER): Payer: Medicare Other

## 2012-07-13 ENCOUNTER — Other Ambulatory Visit (HOSPITAL_BASED_OUTPATIENT_CLINIC_OR_DEPARTMENT_OTHER): Payer: Medicare Other | Admitting: Lab

## 2012-07-13 VITALS — BP 146/71 | HR 94 | Temp 97.6°F | Resp 20 | Ht 65.0 in | Wt 187.4 lb

## 2012-07-13 DIAGNOSIS — Z5112 Encounter for antineoplastic immunotherapy: Secondary | ICD-10-CM

## 2012-07-13 DIAGNOSIS — Z170 Estrogen receptor positive status [ER+]: Secondary | ICD-10-CM

## 2012-07-13 DIAGNOSIS — C50911 Malignant neoplasm of unspecified site of right female breast: Secondary | ICD-10-CM

## 2012-07-13 DIAGNOSIS — C50419 Malignant neoplasm of upper-outer quadrant of unspecified female breast: Secondary | ICD-10-CM

## 2012-07-13 DIAGNOSIS — G609 Hereditary and idiopathic neuropathy, unspecified: Secondary | ICD-10-CM

## 2012-07-13 DIAGNOSIS — Z5111 Encounter for antineoplastic chemotherapy: Secondary | ICD-10-CM

## 2012-07-13 LAB — CBC WITH DIFFERENTIAL/PLATELET
Basophils Absolute: 0 10*3/uL (ref 0.0–0.1)
Eosinophils Absolute: 0 10*3/uL (ref 0.0–0.5)
HGB: 10.2 g/dL — ABNORMAL LOW (ref 11.6–15.9)
NEUT#: 17.9 10*3/uL — ABNORMAL HIGH (ref 1.5–6.5)
RBC: 3.06 10*6/uL — ABNORMAL LOW (ref 3.70–5.45)
RDW: 17.3 % — ABNORMAL HIGH (ref 11.2–14.5)
WBC: 21.1 10*3/uL — ABNORMAL HIGH (ref 3.9–10.3)
lymph#: 1.6 10*3/uL (ref 0.9–3.3)
nRBC: 0 % (ref 0–0)

## 2012-07-13 LAB — COMPREHENSIVE METABOLIC PANEL (CC13)
ALT: 18 U/L (ref 0–55)
Albumin: 3.6 g/dL (ref 3.5–5.0)
Alkaline Phosphatase: 110 U/L (ref 40–150)
CO2: 22 mEq/L (ref 22–29)
Glucose: 119 mg/dl — ABNORMAL HIGH (ref 70–99)
Potassium: 4 mEq/L (ref 3.5–5.1)
Sodium: 138 mEq/L (ref 136–145)
Total Protein: 6.8 g/dL (ref 6.4–8.3)

## 2012-07-13 MED ORDER — SODIUM CHLORIDE 0.9 % IV SOLN
75.0000 mg/m2 | Freq: Once | INTRAVENOUS | Status: AC
  Administered 2012-07-13: 150 mg via INTRAVENOUS
  Filled 2012-07-13: qty 15

## 2012-07-13 MED ORDER — DEXAMETHASONE SODIUM PHOSPHATE 4 MG/ML IJ SOLN
20.0000 mg | Freq: Once | INTRAMUSCULAR | Status: AC
  Administered 2012-07-13: 20 mg via INTRAVENOUS

## 2012-07-13 MED ORDER — ACETAMINOPHEN 325 MG PO TABS
650.0000 mg | ORAL_TABLET | Freq: Once | ORAL | Status: AC
  Administered 2012-07-13: 650 mg via ORAL

## 2012-07-13 MED ORDER — SODIUM CHLORIDE 0.9 % IV SOLN
Freq: Once | INTRAVENOUS | Status: AC
  Administered 2012-07-13: 14:00:00 via INTRAVENOUS

## 2012-07-13 MED ORDER — SODIUM CHLORIDE 0.9 % IJ SOLN
10.0000 mL | INTRAMUSCULAR | Status: DC | PRN
  Administered 2012-07-13: 10 mL
  Filled 2012-07-13: qty 10

## 2012-07-13 MED ORDER — ONDANSETRON 16 MG/50ML IVPB (CHCC)
16.0000 mg | Freq: Once | INTRAVENOUS | Status: AC
  Administered 2012-07-13: 16 mg via INTRAVENOUS

## 2012-07-13 MED ORDER — TRASTUZUMAB CHEMO INJECTION 440 MG
2.0000 mg/kg | Freq: Once | INTRAVENOUS | Status: AC
  Administered 2012-07-13: 168 mg via INTRAVENOUS
  Filled 2012-07-13: qty 8

## 2012-07-13 MED ORDER — DIPHENHYDRAMINE HCL 25 MG PO CAPS
25.0000 mg | ORAL_CAPSULE | Freq: Once | ORAL | Status: AC
  Administered 2012-07-13: 25 mg via ORAL

## 2012-07-13 MED ORDER — HEPARIN SOD (PORK) LOCK FLUSH 100 UNIT/ML IV SOLN
500.0000 [IU] | Freq: Once | INTRAVENOUS | Status: AC | PRN
  Administered 2012-07-13: 500 [IU]
  Filled 2012-07-13: qty 5

## 2012-07-13 MED ORDER — SODIUM CHLORIDE 0.9 % IV SOLN
650.0000 mg | Freq: Once | INTRAVENOUS | Status: AC
  Administered 2012-07-13: 650 mg via INTRAVENOUS
  Filled 2012-07-13: qty 65

## 2012-07-13 NOTE — Patient Instructions (Signed)
Doing well.  Proceed with chemotherapy.  Please call us if you have any questions or concerns.    

## 2012-07-13 NOTE — Progress Notes (Signed)
OFFICE PROGRESS NOTE  CC  [facility] [place], [place] [address] [place] [address] Dr. Cyndia Bent Dr. Chipper Herb  DIAGNOSIS: 77 year old female with new diagnosis of invasive ductal carcinoma that is ER positive PR positive HER-2/neu positive by [facility]  PRIOR THERAPY:  #1 patient was originally seenBy me on 02/21/2012 when she was diagnosed with invasive ductal carcinoma of the right breast stage II a with a positive lymph node. She underwent a lumpectomy and sentinel lymph node biopsy.The final pathology revealed a 1.8 cm invasive ductal carcinoma with low grade ductal carcinoma in situ the tumor was grade 2 ER positive PR positive Ki-67 was 11%. HER-2/neu was equivocal by cish.  #2 she had a sentinel lymph node biopsy one node was positive for metastatic disease. Making her a stage II A.  #3 patient and I and her family had an extensive discussion at the original meeting and we sent her tumor for HER-2/neu testing by [facility]. By [facility]  HER-2/neu is positive.  #4 patient is now going to be receiving combination chemotherapy consisting of Taxotere carboplatinum and Herceptin Beginning 03/30/2012 She will receive TCH combination every 21 days. She will receive day 2 Neulasta when she receives [facility]. On other weeks she will receive weekly Herceptin. A total of 6 cycles Of TCH is planned. Once she completes this she will then proceed with radiation therapy and we will plan on changing her Herceptin to every 3 weeks to complete out one year of therapy.  CURRENT THERAPY: Cycle 6 day 1 of TCH With day 2 Neulasta, and weekly herceptin  INTERVAL HISTORY: Heather Vega 77 y.o. female returns for followup visit today. She is doing well today.  She tried the Neurontin and developed bilateral lower extremity swelling, and pain in her legs.  She has since stopped the Neurontin and is doing better.  The numbness is resolved.  She denies fevers, chills, nausea, vomiting,  constipation, diarrhea, numbness or any further concerns.  She is ready for her final chemotherapy.    MEDICAL HISTORY: Past Medical History  Diagnosis Date  . Hyperlipidemia     doesn't require meds  . Asthma     pt states its "not bad"  . Back pain     buldging disc and pt says 2 are "flat"  . Hemorrhoids   . History of colon polyps   . Diverticulosis   . Cancer     breast right  . Hard of hearing   . Diabetes mellitus without complication     takes Metformin daily,Type II  . Allergy   . Cataract     b/l surgery  . Hypothyroidism     hx of;but has been off of meds >66yrs ago  . Wears glasses   . Hypertension     history, was on b/p  meds years ago  . PONV (postoperative nausea and vomiting)     ALLERGIES:  has No Known Allergies.  MEDICATIONS:  Current Outpatient Prescriptions  Medication Sig Dispense Refill  . aspirin 81 MG tablet Take 81 mg by mouth daily.      . B Complex-C (SUPER B COMPLEX PO) Take 1 capsule by mouth daily.      . carvedilol (COREG) 3.125 MG tablet Take 1 tablet (3.125 mg total) by mouth 2 (two) times daily with a meal.  60 tablet  3  . CINNAMON PO Take 1 tablet by mouth 2 (two) times daily.       Marland Kitchen dexamethasone (DECADRON) 4  MG tablet Take 2 tablets (8 mg total) by mouth 2 (two) times daily with a meal. Take two times a day the day before Taxotere. Then take two times a day starting the day after chemo for 3 days.  60 tablet  6  . lidocaine-prilocaine (EMLA) cream Apply topically as needed.  30 g  7  . LORazepam (ATIVAN) 0.5 MG tablet Take 1 tablet (0.5 mg total) by mouth every 6 (six) hours as needed (Nausea or vomiting).  30 tablet  0  . metFORMIN (GLUCOPHAGE) 1000 MG tablet Take 1,000 mg by mouth 2 (two) times daily with a meal.      . Multiple Vitamins-Minerals (MULTIVITAMIN WITH MINERALS) tablet Take 1 tablet by mouth daily.      . naproxen sodium (ANAPROX) 220 MG tablet Take 220 mg by mouth as needed.      . Nutritional Supplements (FIBER  FORMULA) POWD Take 30 mLs by mouth daily. tkes 2-3 tablespoons daily for constipation      . ondansetron (ZOFRAN) 8 MG tablet Take 1 tablet (8 mg total) by mouth 2 (two) times daily. Take two times a day starting the day after chemo for 3 days. Then take two times a day as needed for nausea or vomiting.  30 tablet  1  . prochlorperazine (COMPAZINE) 10 MG tablet Take 1 tablet (10 mg total) by mouth every 6 (six) hours as needed (Nausea or vomiting).  30 tablet  1  . prochlorperazine (COMPAZINE) 25 MG suppository Place 1 suppository (25 mg total) rectally every 12 (twelve) hours as needed for nausea.  12 suppository  3  . Vitamins C E (CRANBERRY CONCENTRATE PO) Take 1 tablet by mouth 2 (two) times daily.        No current facility-administered medications for this visit.    SURGICAL HISTORY:  Past Surgical History  Procedure Laterality Date  . Nose surgery  1953  . Tonsillectomy  1955  . Nasal fracture surgery      as a child x 2  . Tonsillectomy    . Breast surgery  70's    right  . Appendectomy    . Bilateral cataract surgery    . Colonoscopy    . Neuroplasty / transposition median nerve at carpal tunnel bilateral    . Cardiac catheterization  20+yrs ago  . Breast lumpectomy with needle localization and axillary sentinel lymph node bx  02/07/2012    Procedure: BREAST LUMPECTOMY WITH NEEDLE LOCALIZATION AND AXILLARY SENTINEL LYMPH NODE BX;  Surgeon: Currie Paris, MD;  Location: [facility] OR;  Service: General;  Laterality: Right;  Right needle loc lumpectomy and Senitel Lymph Node Biopsy   . Abdominal hysterectomy      ovaries intact  . Portacath placement  03/26/2012    Procedure: INSERTION PORT-A-CATH;  Surgeon: Currie Paris, MD;  Location: [facility];  Service: General;  Laterality: Left;    REVIEW OF SYSTEMS:   General: fatigue (+), night sweats (-), fever (-), pain (-) Lymph: palpable nodes (-) HEENT: vision changes (-), mucositis (-), gum bleeding (-),  epistaxis (-) Cardiovascular: chest pain (-), palpitations (-) Pulmonary: shortness of breath (-), dyspnea on exertion (-), cough (-), hemoptysis (-) GI:  Early satiety (-), melena (-), dysphagia (-), nausea/vomiting (-), diarrhea (-) GU: dysuria (-), hematuria (-), incontinence (-) Musculoskeletal: joint swelling (-), joint pain (+), back pain (-) Neuro: weakness (-), numbness (-), headache (-), confusion (-) Skin: Rash (-), lesions (-), dryness (-) Psych: depression (-), suicidal/homicidal  ideation (-), feeling of hopelessness (-)  PHYSICAL EXAMINATION: Blood pressure 146/71, pulse 94, temperature 97.6 F (36.4 C), temperature source Oral, resp. rate 20, height 5\' 5"  (1.651 m), weight 187 lb 6.4 oz (85.004 kg). Body mass index is 31.18 kg/(m^2). General: Patient is a well appearing female in no acute distress HEENT: PERRLA, sclerae anicteric no conjunctival pallor, MMM Neck: supple, right swollen lymph node in cervical chain, tender to palpation, approx .5 cm Lungs: clear to auscultation bilaterally, no wheezes, rhonchi, or rales Cardiovascular: regular rate rhythm, S1, S2, no murmurs, rubs or gallops Abdomen: Soft, non-tender, non-distended, normoactive bowel sounds, no HSM Extremities: warm and well perfused, no clubbing, cyanosis, or edema Skin: No rashes or lesions Neuro: Non-focal Right breast lumpectomy site without nodularity ECOG PERFORMANCE STATUS: 0 - Asymptomatic      LABORATORY DATA: Lab Results  Component Value Date   WBC 21.1* 07/13/2012   HGB 10.2* 07/13/2012   HCT 30.5* 07/13/2012   MCV 99.7 07/13/2012   PLT 177 07/13/2012      Chemistry      Component Value Date/Time   NA 140 07/06/2012 1047   NA 138 01/31/2012 1030   K 3.8 07/06/2012 1047   K 4.9 01/31/2012 1030   CL 100 07/06/2012 1047   CL 98 01/31/2012 1030   CO2 26 07/06/2012 1047   CO2 28 01/31/2012 1030   BUN 17.6 07/06/2012 1047   BUN 18 01/31/2012 1030   CREATININE 0.7 07/06/2012 1047   CREATININE 0.78  01/31/2012 1030      Component Value Date/Time   CALCIUM 8.5 07/06/2012 1047   CALCIUM 9.8 01/31/2012 1030   ALKPHOS 151* 07/06/2012 1047   ALKPHOS 75 01/31/2012 1030   AST 18 07/06/2012 1047   AST 32 01/31/2012 1030   ALT 20 07/06/2012 1047   ALT 23 01/31/2012 1030   BILITOT 0.37 07/06/2012 1047   BILITOT 0.4 01/31/2012 1030       RADIOGRAPHIC STUDIES:  No results found.  ASSESSMENT: 77 year old female with  #1 stage II A. (T1 C. N1 a  cM0) invasive ductal carcinoma Measuring 1.8 cm ER positive PR positive HER-2/neu positive. One sentinel node was positive for metastatic disease.  #2 patient is a good candidate for adjuvant HER-2-based therapy. We discussed treatment with Taxotere carboplatinum and Herceptin. The Taxotere carboplatinum will be given every 3 weeks with day 2 Neulasta for a total of 6 cycles. The Herceptin will be given weekly for the duration of the chemotherapy and then changed to every 3 weeks to complete one year of Herceptin therapy.  #3 because patient has had a lumpectomy she will also require radiation therapy the patient has been seen by Dr. Ballard Russell regarding this we will refer her back to Dr. Dayton Scrape after she completes her chemotherapy.  #4 Neuropathy  PLAN:   #1Ms. Vega will proceed with [facility] today. She has f/u with Dr. Gala Romney on 07/16/12.    #2 We will see her back next week for her weekly herceptin. She does have f/u with Dr. Dayton Scrape on 07/25/12.    #3 She did not tolerate the Gabapentin well.  It has been stopped.   All questions were answered. The patient knows to call the clinic with any problems, questions or concerns. We can certainly see the patient much sooner if necessary.  I spent 25 minutes counseling the patient face to face. The total time spent in the appointment was 30 minutes.  Cherie Ouch Lyn Hollingshead, NP Medical Oncology [facility] Phone: [phone]

## 2012-07-13 NOTE — Patient Instructions (Addendum)
[facility] Discharge Instructions for Patients Receiving Chemotherapy  Today you received the following chemotherapy agents Taxotere,Carboplatin,Herceptin  To help prevent nausea and vomiting after your treatment, we encourage you to take your nausea medication as prescribed.   If you develop nausea and vomiting that is not controlled by your nausea medication, call the clinic. If it is after clinic hours your family physician or the after hours number for the clinic or go to the Emergency Department.   BELOW ARE SYMPTOMS THAT SHOULD BE REPORTED IMMEDIATELY:  *FEVER GREATER THAN 100.5 F  *CHILLS WITH OR WITHOUT FEVER  NAUSEA AND VOMITING THAT IS NOT CONTROLLED WITH YOUR NAUSEA MEDICATION  *UNUSUAL SHORTNESS OF BREATH  *UNUSUAL BRUISING OR BLEEDING  TENDERNESS IN MOUTH AND THROAT WITH OR WITHOUT PRESENCE OF ULCERS  *URINARY PROBLEMS  *BOWEL PROBLEMS  UNUSUAL RASH Items with * indicate a potential emergency and should be followed up as soon as possible.  . The clinic phone number is [phone].   I have been informed and understand all the instructions given to me. I know to contact the clinic, my physician, or go to the Emergency Department if any problems should occur. I do not have any questions at this time, but understand that I may call the clinic during office hours   should I have any questions or need assistance in obtaining follow up care.    __________________________________________  _____________  __________ Signature of Patient or Authorized Representative            Date                   Time    __________________________________________ Nurse's Signature

## 2012-07-14 ENCOUNTER — Ambulatory Visit (HOSPITAL_BASED_OUTPATIENT_CLINIC_OR_DEPARTMENT_OTHER): Payer: Medicare Other

## 2012-07-14 VITALS — BP 155/71 | HR 76 | Temp 97.8°F | Resp 20

## 2012-07-14 DIAGNOSIS — Z5189 Encounter for other specified aftercare: Secondary | ICD-10-CM

## 2012-07-14 DIAGNOSIS — C50419 Malignant neoplasm of upper-outer quadrant of unspecified female breast: Secondary | ICD-10-CM

## 2012-07-14 DIAGNOSIS — C50911 Malignant neoplasm of unspecified site of right female breast: Secondary | ICD-10-CM

## 2012-07-14 MED ORDER — PEGFILGRASTIM INJECTION 6 MG/0.6ML
6.0000 mg | Freq: Once | SUBCUTANEOUS | Status: AC
  Administered 2012-07-14: 6 mg via SUBCUTANEOUS

## 2012-07-16 ENCOUNTER — Ambulatory Visit: Payer: Medicare Other | Admitting: Adult Health

## 2012-07-16 ENCOUNTER — Ambulatory Visit (HOSPITAL_COMMUNITY)
Admission: RE | Admit: 2012-07-16 | Discharge: 2012-07-16 | Disposition: A | Discharge status: Home / Self Care | Payer: Medicare Other | Source: Ambulatory Visit | Attending: Family Medicine | Admitting: Family Medicine

## 2012-07-16 ENCOUNTER — Ambulatory Visit (HOSPITAL_BASED_OUTPATIENT_CLINIC_OR_DEPARTMENT_OTHER)
Admission: RE | Admit: 2012-07-16 | Discharge: 2012-07-16 | Disposition: A | Discharge status: Home / Self Care | Payer: Medicare Other | Source: Ambulatory Visit | Attending: Internal Medicine | Admitting: Internal Medicine

## 2012-07-16 VITALS — BP 152/96 | HR 82 | Wt 185.0 lb

## 2012-07-16 DIAGNOSIS — C50919 Malignant neoplasm of unspecified site of unspecified female breast: Secondary | ICD-10-CM | POA: Insufficient documentation

## 2012-07-16 DIAGNOSIS — C50911 Malignant neoplasm of unspecified site of right female breast: Secondary | ICD-10-CM

## 2012-07-16 DIAGNOSIS — Z09 Encounter for follow-up examination after completed treatment for conditions other than malignant neoplasm: Secondary | ICD-10-CM

## 2012-07-16 DIAGNOSIS — I10 Essential (primary) hypertension: Secondary | ICD-10-CM | POA: Insufficient documentation

## 2012-07-16 MED ORDER — CARVEDILOL 6.25 MG PO TABS
6.2500 mg | ORAL_TABLET | Freq: Two times a day (BID) | ORAL | Status: DC
Start: 2012-07-16 — End: 2012-12-05

## 2012-07-16 NOTE — Progress Notes (Signed)
  Echocardiogram 2D Echocardiogram has been performed.  Jorje Guild 07/16/2012, 12:10 PM

## 2012-07-16 NOTE — Patient Instructions (Addendum)
Increase carvedilol to 2 tabs twice daily until prescription runs out then can start 1 tab (6.25 mg) twice daily  Follow up 3 months with echo

## 2012-07-19 ENCOUNTER — Other Ambulatory Visit: Payer: Self-pay | Admitting: Medical Oncology

## 2012-07-19 DIAGNOSIS — C50911 Malignant neoplasm of unspecified site of right female breast: Secondary | ICD-10-CM

## 2012-07-19 NOTE — Assessment & Plan Note (Signed)
BP up. Increase carvedilol to 6.25 bid.  

## 2012-07-19 NOTE — Progress Notes (Signed)
Oncologist: Dr Welton Flakes General Surgeon: Dr Jamey Ripa Radiation Oncologist: Dr Dayton Scrape PCP: Dr Sharlot Gowda  HPI: Heather Vega is a 77 year old female with new diagnosis of invasive ductal carcinoma that is ER positive PR positive HER-2/neu positive, hypothyroid (not on medicaitons), DM II, and HOH. Referred by Dr. Welton Flakes for enrollment into cardio-oncology clinic.  02/21/2012 when she was diagnosed with invasive ductal carcinoma of the right breast stage II a with a positive lymph node. She underwent a lumpectomy and sentinel lymph node biopsy.The final pathology revealed a 1.8 cm invasive ductal carcinoma with low grade ductal carcinoma in situ the tumor was grade 2 ER positive PR positive Ki-67 was 11%. HER-2/neu was equivocal by cish. Plan for Taxotere carboplatinum and Herceptin. Taxotere carboplatinum will be given every 3 weeks with day 2 Neulasta for a total of 6 cycles. The Herceptin will be given weekly for the duration of the chemotherapy and then changed to every 3 weeks to complete one year of Herceptin therapy.   Denies any h/o heart disease. Had cath in 1996 in [place], [place]. 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 Clean cors. Normal EF. Says she has white coat HTN  ECHO  03/29/12 EF 60-65% lateral S' 8.3 severe LVH 07/16/12 EF 60% lat s' 9.7 grade 1 diastolic dysfunction   She returns for follow up today with her daughter, Bonita Quin.  She has finished her chemo and will continue on herceptin q3 weekly.  She denies orthopnea, PND or edema.  No chest pain.  Gabapentin stopped due to swelling and itching.    Review of Systems: All pertinent positives and negatives as in HPI, otherwise negative    Past Medical History  Diagnosis Date  . Hyperlipidemia     doesn't require meds  . Asthma     pt states its "not bad"  . Back pain     buldging disc and pt says 2 are "flat"  . Hemorrhoids   . History of colon polyps   . Diverticulosis   . Cancer     breast right  . Hard of hearing    . Diabetes mellitus without complication     takes Metformin daily,Type II  . Allergy   . Cataract     b/l surgery  . Hypothyroidism     hx of;but has been off of meds >28yrs ago  . Wears glasses   . Hypertension     history, was on b/p  meds years ago  . PONV (postoperative nausea and vomiting)     Current Outpatient Prescriptions  Medication Sig Dispense Refill  . aspirin 81 MG tablet Take 81 mg by mouth daily.      . B Complex-C (SUPER B COMPLEX PO) Take 1 capsule by mouth daily.      . carvedilol (COREG) 6.25 MG tablet Take 1 tablet (6.25 mg total) by mouth 2 (two) times daily with a meal.  60 tablet  3  . CINNAMON PO Take 1 tablet by mouth 2 (two) times daily.       Marland Kitchen dexamethasone (DECADRON) 4 MG tablet Take 2 tablets (8 mg total) by mouth 2 (two) times daily with a meal. Take two times a day the day before Taxotere. Then take two times a day starting the day after chemo for 3 days.  60 tablet  6  . lidocaine-prilocaine (EMLA) cream Apply topically as needed.  30 g  7  . LORazepam (ATIVAN) 0.5 MG tablet Take 1 tablet (0.5 mg total) by mouth  every 6 (six) hours as needed (Nausea or vomiting).  30 tablet  0  . metFORMIN (GLUCOPHAGE) 1000 MG tablet Take 1,000 mg by mouth 2 (two) times daily with a meal.      . Multiple Vitamins-Minerals (MULTIVITAMIN WITH MINERALS) tablet Take 1 tablet by mouth daily.      . naproxen sodium (ANAPROX) 220 MG tablet Take 220 mg by mouth as needed.      . Nutritional Supplements (FIBER FORMULA) POWD Take 30 mLs by mouth daily. tkes 2-3 tablespoons daily for constipation      . ondansetron (ZOFRAN) 8 MG tablet Take 1 tablet (8 mg total) by mouth 2 (two) times daily. Take two times a day starting the day after chemo for 3 days. Then take two times a day as needed for nausea or vomiting.  30 tablet  1  . prochlorperazine (COMPAZINE) 10 MG tablet Take 1 tablet (10 mg total) by mouth every 6 (six) hours as needed (Nausea or vomiting).  30 tablet  1  .  prochlorperazine (COMPAZINE) 25 MG suppository Place 1 suppository (25 mg total) rectally every 12 (twelve) hours as needed for nausea.  12 suppository  3  . Vitamins C E (CRANBERRY CONCENTRATE PO) Take 1 tablet by mouth 2 (two) times daily.        No current facility-administered medications for this encounter.     No Known Allergies  PHYSICAL EXAM: Filed Vitals:   07/16/12 1206  BP: 152/96  Pulse: 82   General:  elderly. No respiratory difficulty HEENT: normal Neck: supple. no JVD. Carotids 2+ bilat; no bruits. No lymphadenopathy or thryomegaly appreciated. Cor: PMI nondisplaced. Regular rate & rhythm. No rubs, gallops or murmurs. Lungs: clear Abdomen: soft, nontender, nondistended. No hepatosplenomegaly. No bruits or masses. Good bowel sounds. Extremities: no cyanosis, clubbing, rash, tr edema Neuro: alert & oriented x 3, cranial nerves grossly intact except for reduced hearing. moves all 4 extremities w/o difficulty. Affect pleasant,  ASSESSMENT & PLAN:

## 2012-07-19 NOTE — Assessment & Plan Note (Signed)
Explained incidence of cardio-toxicity with Herceptin. Reviewed role of cardio-oncology clinic. Echo images reviewed personally. All parameters stable. Reviewed signs and symptoms of HF to look for. Continue Herceptin. Due to HTN will increase carvedilol to 6.25 bid. Follow-up with echo in 3 months.

## 2012-07-20 ENCOUNTER — Encounter: Payer: Self-pay | Admitting: Adult Health

## 2012-07-20 ENCOUNTER — Ambulatory Visit (HOSPITAL_BASED_OUTPATIENT_CLINIC_OR_DEPARTMENT_OTHER): Payer: Medicare Other | Admitting: Adult Health

## 2012-07-20 ENCOUNTER — Telehealth: Payer: Self-pay | Admitting: *Deleted

## 2012-07-20 ENCOUNTER — Encounter: Payer: Self-pay | Admitting: Oncology

## 2012-07-20 ENCOUNTER — Ambulatory Visit (HOSPITAL_BASED_OUTPATIENT_CLINIC_OR_DEPARTMENT_OTHER): Payer: Medicare Other

## 2012-07-20 ENCOUNTER — Other Ambulatory Visit (HOSPITAL_BASED_OUTPATIENT_CLINIC_OR_DEPARTMENT_OTHER): Payer: Medicare Other | Admitting: Lab

## 2012-07-20 VITALS — BP 144/74 | HR 77 | Temp 98.0°F | Resp 20 | Ht 65.0 in | Wt 179.8 lb

## 2012-07-20 DIAGNOSIS — C50911 Malignant neoplasm of unspecified site of right female breast: Secondary | ICD-10-CM

## 2012-07-20 DIAGNOSIS — Z5112 Encounter for antineoplastic immunotherapy: Secondary | ICD-10-CM

## 2012-07-20 DIAGNOSIS — C50419 Malignant neoplasm of upper-outer quadrant of unspecified female breast: Secondary | ICD-10-CM

## 2012-07-20 DIAGNOSIS — E860 Dehydration: Secondary | ICD-10-CM

## 2012-07-20 DIAGNOSIS — Z170 Estrogen receptor positive status [ER+]: Secondary | ICD-10-CM

## 2012-07-20 LAB — CBC WITH DIFFERENTIAL/PLATELET
Eosinophils Absolute: 0.1 10*3/uL (ref 0.0–0.5)
HCT: 31 % — ABNORMAL LOW (ref 34.8–46.6)
LYMPH%: 22.4 % (ref 14.0–49.7)
MCHC: 33.2 g/dL (ref 31.5–36.0)
MCV: 100 fL (ref 79.5–101.0)
MONO#: 0.8 10*3/uL (ref 0.1–0.9)
MONO%: 10.2 % (ref 0.0–14.0)
NEUT#: 5.5 10*3/uL (ref 1.5–6.5)
NEUT%: 66.6 % (ref 38.4–76.8)
Platelets: 212 10*3/uL (ref 145–400)
WBC: 8.2 10*3/uL (ref 3.9–10.3)

## 2012-07-20 LAB — COMPREHENSIVE METABOLIC PANEL (CC13)
BUN: 20.7 mg/dL (ref 7.0–26.0)
CO2: 26 mEq/L (ref 22–29)
Calcium: 8.7 mg/dL (ref 8.4–10.4)
Chloride: 98 mEq/L (ref 98–107)
Creatinine: 0.7 mg/dL (ref 0.6–1.1)

## 2012-07-20 MED ORDER — SODIUM CHLORIDE 0.9 % IV SOLN
Freq: Once | INTRAVENOUS | Status: AC
  Administered 2012-07-20: 11:00:00 via INTRAVENOUS

## 2012-07-20 MED ORDER — ACETAMINOPHEN 325 MG PO TABS
650.0000 mg | ORAL_TABLET | Freq: Once | ORAL | Status: AC
  Administered 2012-07-20: 650 mg via ORAL

## 2012-07-20 MED ORDER — HEPARIN SOD (PORK) LOCK FLUSH 100 UNIT/ML IV SOLN
500.0000 [IU] | Freq: Once | INTRAVENOUS | Status: AC | PRN
  Administered 2012-07-20: 500 [IU]
  Filled 2012-07-20: qty 5

## 2012-07-20 MED ORDER — TRASTUZUMAB CHEMO INJECTION 440 MG
2.0000 mg/kg | Freq: Once | INTRAVENOUS | Status: AC
  Administered 2012-07-20: 168 mg via INTRAVENOUS
  Filled 2012-07-20: qty 8

## 2012-07-20 MED ORDER — DIPHENHYDRAMINE HCL 25 MG PO CAPS
25.0000 mg | ORAL_CAPSULE | Freq: Once | ORAL | Status: AC
  Administered 2012-07-20: 25 mg via ORAL

## 2012-07-20 MED ORDER — SODIUM CHLORIDE 0.9 % IJ SOLN
10.0000 mL | INTRAMUSCULAR | Status: DC | PRN
  Administered 2012-07-20: 10 mL
  Filled 2012-07-20: qty 10

## 2012-07-20 MED ORDER — SODIUM CHLORIDE 0.9 % IV SOLN
1000.0000 mL | Freq: Once | INTRAVENOUS | Status: AC
  Administered 2012-07-20: 1000 mL via INTRAVENOUS

## 2012-07-20 NOTE — Telephone Encounter (Signed)
appts made and printed...td 

## 2012-07-20 NOTE — Telephone Encounter (Signed)
Per staff phone call and POF I have schedueld appts.  JMW  

## 2012-07-20 NOTE — Progress Notes (Addendum)
OFFICE PROGRESS NOTE  CC  [facility], MD [address] [place] [address] Dr. Cyndia Bent Dr. Chipper Herb  DIAGNOSIS: 77 year old female with new diagnosis of invasive ductal carcinoma that is ER positive PR positive HER-2/neu positive by [facility] Testing  PRIOR THERAPY:  #1 patient was originally seenBy me on 02/21/2012 when she was diagnosed with invasive ductal carcinoma of the right breast stage II a with a positive lymph node. She underwent a lumpectomy and sentinel lymph node biopsy.The final pathology revealed a 1.8 cm invasive ductal carcinoma with low grade ductal carcinoma in situ the tumor was grade 2 ER positive PR positive Ki-67 was 11%. HER-2/neu was equivocal by cish.  #2 she had a sentinel lymph node biopsy one node was positive for metastatic disease. Making her a stage II A.  #3 patient and I and her family had an extensive discussion at the original meeting and we sent her tumor for HER-2/neu testing by [facility]. By [facility]  HER-2/neu is positive.  #4 patient is now going to be receiving combination chemotherapy consisting of Taxotere carboplatinum and Herceptin Beginning 03/30/2012 She will receive TCH combination every 21 days. She will receive day 2 Neulasta when she receives [facility]. On other weeks she will receive weekly Herceptin. A total of 6 cycles Of TCH is planned. Once she completes this she will then proceed with radiation therapy and we will plan on changing her Herceptin to every 3 weeks to complete out one year of therapy.  CURRENT THERAPY: Cycle 6 day 8 of TCH with weekly herceptin  INTERVAL HISTORY: Heather Vega 77 y.o. female returns for followup visit today for her breast cancer.  She received her sixth cycle of Taxotere/carbo/herceptin last week.  She is fatigued and feels dehydrated.  She has been drinking 4 bottles of water a day (16 ounces) in addition to gatorade.  She denies dizziness.  She does not have any numbness.  She denies  fevers, chills, nausea, vomiting.  She has been having larger bowel movements then usual, but they are not watery.  Her appetite is decreased, she is eating about 20% of meals.  Otherwise, a 10 point ROS is neg.   MEDICAL HISTORY: Past Medical History  Diagnosis Date  . Hyperlipidemia     doesn't require meds  . Asthma     pt states its "not bad"  . Back pain     buldging disc and pt says 2 are "flat"  . Hemorrhoids   . History of colon polyps   . Diverticulosis   . Cancer     breast right  . Hard of hearing   . Diabetes mellitus without complication     takes Metformin daily,Type II  . Allergy   . Cataract     b/l surgery  . Hypothyroidism     hx of;but has been off of meds >45yrs ago  . Wears glasses   . Hypertension     history, was on b/p  meds years ago  . PONV (postoperative nausea and vomiting)     ALLERGIES:  has No Known Allergies.  MEDICATIONS:  Current Outpatient Prescriptions  Medication Sig Dispense Refill  . aspirin 81 MG tablet Take 81 mg by mouth daily.      . B Complex-C (SUPER B COMPLEX PO) Take 1 capsule by mouth daily.      . carvedilol (COREG) 6.25 MG tablet Take 1 tablet (6.25 mg total) by mouth 2 (two) times daily with a meal.  60 tablet  3  . CINNAMON PO Take 1 tablet by mouth 2 (two) times daily.       Marland Kitchen dexamethasone (DECADRON) 4 MG tablet Take 2 tablets (8 mg total) by mouth 2 (two) times daily with a meal. Take two times a day the day before Taxotere. Then take two times a day starting the day after chemo for 3 days.  60 tablet  6  . lidocaine-prilocaine (EMLA) cream Apply topically as needed.  30 g  7  . LORazepam (ATIVAN) 0.5 MG tablet Take 1 tablet (0.5 mg total) by mouth every 6 (six) hours as needed (Nausea or vomiting).  30 tablet  0  . metFORMIN (GLUCOPHAGE) 1000 MG tablet Take 1,000 mg by mouth 2 (two) times daily with a meal.      . Multiple Vitamins-Minerals (MULTIVITAMIN WITH MINERALS) tablet Take 1 tablet by mouth daily.      .  naproxen sodium (ANAPROX) 220 MG tablet Take 220 mg by mouth as needed.      . Nutritional Supplements (FIBER FORMULA) POWD Take 30 mLs by mouth daily. tkes 2-3 tablespoons daily for constipation      . ondansetron (ZOFRAN) 8 MG tablet Take 1 tablet (8 mg total) by mouth 2 (two) times daily. Take two times a day starting the day after chemo for 3 days. Then take two times a day as needed for nausea or vomiting.  30 tablet  1  . prochlorperazine (COMPAZINE) 10 MG tablet Take 1 tablet (10 mg total) by mouth every 6 (six) hours as needed (Nausea or vomiting).  30 tablet  1  . prochlorperazine (COMPAZINE) 25 MG suppository Place 1 suppository (25 mg total) rectally every 12 (twelve) hours as needed for nausea.  12 suppository  3  . Vitamins C E (CRANBERRY CONCENTRATE PO) Take 1 tablet by mouth 2 (two) times daily.        No current facility-administered medications for this visit.    SURGICAL HISTORY:  Past Surgical History  Procedure Laterality Date  . Nose surgery  1953  . Tonsillectomy  1955  . Nasal fracture surgery      as a child x 2  . Tonsillectomy    . Breast surgery  70's    right  . Appendectomy    . Bilateral cataract surgery    . Colonoscopy    . Neuroplasty / transposition median nerve at carpal tunnel bilateral    . Cardiac catheterization  20+yrs ago  . Breast lumpectomy with needle localization and axillary sentinel lymph node bx  02/07/2012    Procedure: BREAST LUMPECTOMY WITH NEEDLE LOCALIZATION AND AXILLARY SENTINEL LYMPH NODE BX;  Surgeon: Currie Paris, MD;  Location: [facility] OR;  Service: General;  Laterality: Right;  Right needle loc lumpectomy and Senitel Lymph Node Biopsy   . Abdominal hysterectomy      ovaries intact  . Portacath placement  03/26/2012    Procedure: INSERTION PORT-A-CATH;  Surgeon: Currie Paris, MD;  Location: [facility];  Service: General;  Laterality: Left;    REVIEW OF SYSTEMS:   General: fatigue (+), night sweats (-),  fever (-), pain (-) Lymph: palpable nodes (-) HEENT: vision changes (-), mucositis (-), gum bleeding (-), epistaxis (-) Cardiovascular: chest pain (-), palpitations (-) Pulmonary: shortness of breath (-), dyspnea on exertion (-), cough (-), hemoptysis (-) GI:  Early satiety (-), melena (-), dysphagia (-), nausea/vomiting (-), diarrhea (-) GU: dysuria (-), hematuria (-), incontinence (-) Musculoskeletal: joint swelling (-),  joint pain (-), back pain (-) Neuro: weakness (-), numbness (-), headache (-), confusion (-) Skin: Rash (-), lesions (-), dryness (-) Psych: depression (-), suicidal/homicidal ideation (-), feeling of hopelessness (-)  PHYSICAL EXAMINATION: Blood pressure 144/74, pulse 77, temperature 98 F (36.7 C), temperature source Oral, resp. rate 20, height 5\' 5"  (1.651 m), weight 179 lb 12.8 oz (81.557 kg). Body mass index is 29.92 kg/(m^2). General: Patient is a well appearing female in no acute distress HEENT: PERRLA, sclerae anicteric no conjunctival pallor, MMM Neck: supple, right swollen lymph node in cervical chain, tender to palpation, approx .5 cm Lungs: clear to auscultation bilaterally, no wheezes, rhonchi, or rales Cardiovascular: regular rate rhythm, S1, S2, no murmurs, rubs or gallops Abdomen: Soft, non-tender, non-distended, normoactive bowel sounds, no HSM Extremities: warm and well perfused, no clubbing, cyanosis, or edema Skin: No rashes or lesions Neuro: Non-focal Right breast lumpectomy site without nodularity ECOG PERFORMANCE STATUS: 0 - Asymptomatic      LABORATORY DATA: Lab Results  Component Value Date   WBC 8.2 07/20/2012   HGB 10.3* 07/20/2012   HCT 31.0* 07/20/2012   MCV 100.0 07/20/2012   PLT 212 07/20/2012      Chemistry      Component Value Date/Time   NA 138 07/13/2012 1212   NA 138 01/31/2012 1030   K 4.0 07/13/2012 1212   K 4.9 01/31/2012 1030   CL 102 07/13/2012 1212   CL 98 01/31/2012 1030   CO2 22 07/13/2012 1212   CO2 28 01/31/2012  1030   BUN 16.5 07/13/2012 1212   BUN 18 01/31/2012 1030   CREATININE 0.7 07/13/2012 1212   CREATININE 0.78 01/31/2012 1030      Component Value Date/Time   CALCIUM 8.9 07/13/2012 1212   CALCIUM 9.8 01/31/2012 1030   ALKPHOS 110 07/13/2012 1212   ALKPHOS 75 01/31/2012 1030   AST 16 07/13/2012 1212   AST 32 01/31/2012 1030   ALT 18 07/13/2012 1212   ALT 23 01/31/2012 1030   BILITOT 0.37 07/13/2012 1212   BILITOT 0.4 01/31/2012 1030       RADIOGRAPHIC STUDIES:  No results found.  ASSESSMENT: 77 year old female with  #1 stage II A. (T1 C. N1 a  cM0) invasive ductal carcinoma Measuring 1.8 cm ER positive PR positive HER-2/neu positive. One sentinel node was positive for metastatic disease.  #2 patient is a good candidate for adjuvant HER-2-based therapy. We discussed treatment with Taxotere carboplatinum and Herceptin. The Taxotere carboplatinum will be given every 3 weeks with day 2 Neulasta for a total of 6 cycles. The Herceptin will be given weekly for the duration of the chemotherapy and then changed to every 3 weeks to complete one year of Herceptin therapy.  #3 because patient has had a lumpectomy she will also require radiation therapy the patient has been seen by Dr. Ballard Russell regarding this we will refer her back to Dr. Dayton Scrape after she completes her chemotherapy.  #4 dehydration  #5 Decreased appetite  PLAN:   #1Ms. Arsenau will proceed with Herceptin today.She had f/u with Dr. Gala Romney on 4/21, her echo is stable, and they increased the Carvedilol for her hypertension.  They cleared her to continue with Herceptin and will see her back in 3 months.     #2 We will see her back next week for her weekly herceptin. She does have f/u with Dr. Dayton Scrape on 07/25/12.    #3 Due to dehydration we will give her IV fluids today with the Herceptin.    #  4 She has a decreased appetite, this will slowly get better with time.  I encouraged her to continue drinking ensure and boost.     All  questions were answered. The patient knows to call the clinic with any problems, questions or concerns. We can certainly see the patient much sooner if necessary.  I spent 25 minutes counseling the patient face to face. The total time spent in the appointment was 30 minutes.  Cherie Ouch Lyn Hollingshead, NP Medical Oncology [facility] Phone: [phone]

## 2012-07-20 NOTE — Patient Instructions (Signed)
Doing well.  Continue to drink boost, ensure, gatorade, and fluids.  Please call us if you have any questions or concerns.

## 2012-07-20 NOTE — Patient Instructions (Signed)
[facility] Discharge Instructions for Patients Receiving Chemotherapy  Today you received the following chemotherapy agents :  Herceptin.  To help prevent nausea and vomiting after your treatment, we encourage you to take your nausea medication as instructed by your physician.    If you develop nausea and vomiting that is not controlled by your nausea medication, call the clinic. If it is after clinic hours your family physician or the after hours number for the clinic or go to the Emergency Department.   BELOW ARE SYMPTOMS THAT SHOULD BE REPORTED IMMEDIATELY:  *FEVER GREATER THAN 100.5 F  *CHILLS WITH OR WITHOUT FEVER  NAUSEA AND VOMITING THAT IS NOT CONTROLLED WITH YOUR NAUSEA MEDICATION  *UNUSUAL SHORTNESS OF BREATH  *UNUSUAL BRUISING OR BLEEDING  TENDERNESS IN MOUTH AND THROAT WITH OR WITHOUT PRESENCE OF ULCERS  *URINARY PROBLEMS  *BOWEL PROBLEMS  UNUSUAL RASH Items with * indicate a potential emergency and should be followed up as soon as possible.  One of the nurses will contact you 24 hours after your treatment. Please let the nurse know about any problems that you may have experienced. Feel free to call the clinic you have any questions or concerns. The clinic phone number is [phone].   I have been informed and understand all the instructions given to me. I know to contact the clinic, my physician, or go to the Emergency Department if any problems should occur. I do not have any questions at this time, but understand that I may call the clinic during office hours   should I have any questions or need assistance in obtaining follow up care.    __________________________________________  _____________  __________ Signature of Patient or Authorized Representative            Date                   Time    __________________________________________ Nurse's Signature    

## 2012-07-22 ENCOUNTER — Other Ambulatory Visit (HOSPITAL_COMMUNITY): Payer: Self-pay | Admitting: Adult Health

## 2012-07-24 ENCOUNTER — Encounter: Payer: Self-pay | Admitting: Oncology

## 2012-07-24 ENCOUNTER — Ambulatory Visit (INDEPENDENT_AMBULATORY_CARE_PROVIDER_SITE_OTHER): Payer: Medicare Other | Admitting: Surgery

## 2012-07-24 VITALS — BP 134/88 | HR 84 | Temp 98.6°F | Resp 18 | Ht 65.5 in | Wt 182.0 lb

## 2012-07-24 DIAGNOSIS — Z853 Personal history of malignant neoplasm of breast: Secondary | ICD-10-CM

## 2012-07-24 NOTE — Progress Notes (Signed)
NAME: Heather Vega       DOB: [birth year]           DATE: 07/24/2012       MRN: [id number]  CC:   Chief Complaint  Patient presents with  . Breast Cancer Long Term Follow Up    Heather Vega is a 77 y.o.Marland Kitchenfemale who presents for routine followup of her Stage II IDC, receptor +, Her 2 + diagnosed in 12/2011 and treated with lumpectomy, sln, chemo done but herceptincontinues starting radiation soon. She has no problems or concerns on either side.She has a slight sore throat today  PFSH: She has had no significant changes since the last visit here.  ROS: There have been no significant changes since the last visit here  EXAM:  VS: BP 134/88  Pulse 84  Temp(Src) 98.6 F (37 C)  Resp 18  Ht 5' 5.5" (1.664 m)  Wt 182 lb (82.555 kg)  BMI 29.82 kg/m2  General: The patient is alert, oriented, generally healthy appearing, NAD. Mood and affect are normal.  Breasts:  Right slightly smaller than the left, not tender, well healed  Lymphatics: She has no axillary or supraclavicular adenopathy on either side.  Extremities: Full ROM of the surgical side with no lymphedema noted.  Pharynx: Minimally injected, no exudate  Data Reviewed: Chemo notes  Impression: Doing well, with no evidence of recurrent cancer or new cancer  Plan: Will see in three months, after radiation is done

## 2012-07-24 NOTE — Progress Notes (Signed)
Location of Breast Cancer: right breast  Histology per Pathology Report: invasive ductal carcinoma with a positive lymph node  Receptor Status: ER(+), PR (+), Her2-neu (+)  Did patient present with symptoms (if so, please note symptoms) or was this found on screening mammography?: found on mammogram   Past/Anticipated interventions by surgeon, if any: lumpectomy and sentinel lymph node biopsy  Past/Anticipated interventions by medical oncology, if any  6 cycles of Taxotere, Carboplatinum and Hercepten beginning on 03/30/2012.  Once completed, then hecepten every 3 weeks to complete out one year of therapy.  Lymphedema issues, if any:   No   Pain issues, if any:   no  SAFETY ISSUES:  Prior radiation?  no  Pacemaker/ICD?  no  Possible current pregnancy?  no  Is the patient on methotrexate?  no  Current Complaints / other details:   Heather Vega here for consult for right breast cancer.  She is accompanied by her son.  She states she is feeling weak and does have nausea.  She states she does not have an appetite and everything has a metallic taste.   Her last chemotherapy treatment was on April 18.  She does try to drink a boost and ensure daily.

## 2012-07-24 NOTE — Patient Instructions (Signed)
See me again in three months 

## 2012-07-25 ENCOUNTER — Encounter: Payer: Self-pay | Admitting: Oncology

## 2012-07-25 ENCOUNTER — Other Ambulatory Visit: Payer: Self-pay | Admitting: Emergency Medicine

## 2012-07-25 ENCOUNTER — Ambulatory Visit
Admission: RE | Admit: 2012-07-25 | Discharge: 2012-07-25 | Disposition: A | Discharge status: Home / Self Care | Payer: Medicare Other | Source: Ambulatory Visit | Attending: Radiation Oncology | Admitting: Radiation Oncology

## 2012-07-25 VITALS — BP 150/68 | HR 75 | Temp 97.9°F | Ht 65.5 in | Wt 183.6 lb

## 2012-07-25 DIAGNOSIS — C50919 Malignant neoplasm of unspecified site of unspecified female breast: Secondary | ICD-10-CM | POA: Insufficient documentation

## 2012-07-25 DIAGNOSIS — C50911 Malignant neoplasm of unspecified site of right female breast: Secondary | ICD-10-CM

## 2012-07-25 DIAGNOSIS — Z9221 Personal history of antineoplastic chemotherapy: Secondary | ICD-10-CM | POA: Insufficient documentation

## 2012-07-25 DIAGNOSIS — Z170 Estrogen receptor positive status [ER+]: Secondary | ICD-10-CM | POA: Insufficient documentation

## 2012-07-25 DIAGNOSIS — C773 Secondary and unspecified malignant neoplasm of axilla and upper limb lymph nodes: Secondary | ICD-10-CM | POA: Insufficient documentation

## 2012-07-25 HISTORY — DX: Malignant neoplasm of unspecified site of unspecified female breast: C50.919

## 2012-07-25 MED ORDER — LORAZEPAM 0.5 MG PO TABS: 0.5000 mg | ORAL_TABLET | Freq: Four times a day (QID) | ORAL | Status: DC | PRN

## 2012-07-25 NOTE — Progress Notes (Signed)
Please see the Nurse Progress Note in the MD Initial Consult Encounter for this patient. 

## 2012-07-25 NOTE — Progress Notes (Signed)
CC: Dr. Cicero Duck, Dr. [name]  Followup note:  Diagnosis: Stage IIA  (T1, N1, M0) invasive ductal/DCIS of the right breast.  History: The patient returns today for review and scheduling of her radiation therapy. I first saw the patient in consultation on 02/22/2012. She was found to have a mass within the upper-outer quadrant of the right breast which was biopsied on 01/05/2012.  This was diagnostic for invasive ductal carcinoma. Dr. Jamey Ripa performed a right partial mastectomy and sentinel lymph node biopsy. She had a 1.8 cm invasive ductal carcinoma along with low-grade DCIS. Her primary tumor was strongly ER/PR positive with a low proliferation Ki-67 index of 11%. A single sentinel lymph node contained a macro metastasis with no extracapsular extension. She was felt to be HER-2/neu positive with additional testing.Marland Kitchen She went on to receive 6 cycles of TCH chemotherapy under the direction of Dr. Welton Flakes. Her last cycle was on April 18. She feels quite fatigued. She is otherwise doing well.  Physical examination: Alert and oriented. Filed Vitals:   07/25/12 1259  BP: 150/68  Pulse: 75  Temp: 97.9 F (36.6 C)   Head and neck examination: Remarkable for alopecia. Nodes: Without palpable cervical, supraclavicular, or axillary lymphadenopathy. Chest: Lungs clear. Heart: Regular rate and rhythm. Breasts: There is a partial mastectomy scar along the upper-outer quadrant of the right breast from 10 to 12:00. No masses are appreciated. Left breast without masses or lesions. Abdomen: Without hepatomegaly. Extremities: Without edema.  Laboratory data: Lab Results  Component Value Date   WBC 8.2 07/20/2012   HGB 10.3* 07/20/2012   HCT 31.0* 07/20/2012   MCV 100.0 07/20/2012   PLT 212 07/20/2012   Impression stage II A (T1, N1, M0) invasive ductal/DCIS of the right breast. Based on the Z- 11 trial, she can receive radiation therapy to her remaining axilla rather than undergo a completion axillary node  dissection. She is not a candidate for hypofractionated ration therapy since she will need to have nodal irradiation. We discussed the potential acute and late toxicities of radiation therapy. She would like to wait until May 19 for simulation, and then begin her radiation therapy the following week. Consent is signed today. I offered her radiation therapy in [place], but she prefers to come to [facility] for her radiation therapy.  Plan: She'll return for simulation on May 19. She should finish her radiation therapy in late June.  30 minutes was spent face-to-face with the patient, primarily counseling the patient and coordinating her care.

## 2012-07-26 ENCOUNTER — Other Ambulatory Visit: Payer: Self-pay | Admitting: Medical Oncology

## 2012-07-26 DIAGNOSIS — C50911 Malignant neoplasm of unspecified site of right female breast: Secondary | ICD-10-CM

## 2012-07-27 ENCOUNTER — Ambulatory Visit (HOSPITAL_BASED_OUTPATIENT_CLINIC_OR_DEPARTMENT_OTHER): Payer: Medicare Other | Admitting: Adult Health

## 2012-07-27 ENCOUNTER — Other Ambulatory Visit (HOSPITAL_BASED_OUTPATIENT_CLINIC_OR_DEPARTMENT_OTHER): Payer: Medicare Other | Admitting: Lab

## 2012-07-27 ENCOUNTER — Encounter: Payer: Self-pay | Admitting: Adult Health

## 2012-07-27 ENCOUNTER — Ambulatory Visit (HOSPITAL_BASED_OUTPATIENT_CLINIC_OR_DEPARTMENT_OTHER): Payer: Medicare Other

## 2012-07-27 VITALS — BP 123/76 | HR 73 | Temp 98.4°F | Resp 20 | Ht 65.0 in | Wt 185.2 lb

## 2012-07-27 DIAGNOSIS — Z5112 Encounter for antineoplastic immunotherapy: Secondary | ICD-10-CM

## 2012-07-27 DIAGNOSIS — C773 Secondary and unspecified malignant neoplasm of axilla and upper limb lymph nodes: Secondary | ICD-10-CM

## 2012-07-27 DIAGNOSIS — C50419 Malignant neoplasm of upper-outer quadrant of unspecified female breast: Secondary | ICD-10-CM

## 2012-07-27 DIAGNOSIS — Z170 Estrogen receptor positive status [ER+]: Secondary | ICD-10-CM

## 2012-07-27 DIAGNOSIS — C50911 Malignant neoplasm of unspecified site of right female breast: Secondary | ICD-10-CM

## 2012-07-27 LAB — CBC WITH DIFFERENTIAL/PLATELET
BASO%: 0.1 % (ref 0.0–2.0)
Eosinophils Absolute: 0 10*3/uL (ref 0.0–0.5)
LYMPH%: 14.8 % (ref 14.0–49.7)
MCH: 33.4 pg (ref 25.1–34.0)
MCHC: 33 g/dL (ref 31.5–36.0)
MCV: 101.3 fL — ABNORMAL HIGH (ref 79.5–101.0)
MONO%: 6 % (ref 0.0–14.0)
Platelets: 187 10*3/uL (ref 145–400)
RBC: 3.02 10*6/uL — ABNORMAL LOW (ref 3.70–5.45)
nRBC: 0 % (ref 0–0)

## 2012-07-27 LAB — COMPREHENSIVE METABOLIC PANEL (CC13)
ALT: 21 U/L (ref 0–55)
CO2: 28 mEq/L (ref 22–29)
Creatinine: 0.7 mg/dL (ref 0.6–1.1)
Total Bilirubin: 0.43 mg/dL (ref 0.20–1.20)

## 2012-07-27 MED ORDER — SODIUM CHLORIDE 0.9 % IV SOLN
2.0000 mg/kg | Freq: Once | INTRAVENOUS | Status: AC
  Administered 2012-07-27: 168 mg via INTRAVENOUS
  Filled 2012-07-27: qty 8

## 2012-07-27 MED ORDER — ACETAMINOPHEN 325 MG PO TABS
650.0000 mg | ORAL_TABLET | Freq: Once | ORAL | Status: AC
  Administered 2012-07-27: 650 mg via ORAL

## 2012-07-27 MED ORDER — HEPARIN SOD (PORK) LOCK FLUSH 100 UNIT/ML IV SOLN
500.0000 [IU] | Freq: Once | INTRAVENOUS | Status: AC | PRN
  Administered 2012-07-27: 500 [IU]
  Filled 2012-07-27: qty 5

## 2012-07-27 MED ORDER — DIPHENHYDRAMINE HCL 25 MG PO CAPS
25.0000 mg | ORAL_CAPSULE | Freq: Once | ORAL | Status: AC
  Administered 2012-07-27: 25 mg via ORAL

## 2012-07-27 MED ORDER — SODIUM CHLORIDE 0.9 % IJ SOLN
10.0000 mL | INTRAMUSCULAR | Status: DC | PRN
  Administered 2012-07-27: 10 mL
  Filled 2012-07-27: qty 10

## 2012-07-27 MED ORDER — SODIUM CHLORIDE 0.9 % IV SOLN
Freq: Once | INTRAVENOUS | Status: AC
  Administered 2012-07-27: 13:00:00 via INTRAVENOUS

## 2012-07-27 NOTE — Patient Instructions (Addendum)

## 2012-07-27 NOTE — Patient Instructions (Signed)
Doing well.  Proceed with Herceptin.  Please call us if you have any questions or concerns.    

## 2012-07-27 NOTE — Progress Notes (Signed)
OFFICE PROGRESS NOTE  CC  [facility], MD [address] [place] [address] Dr. Cyndia Bent Dr. Chipper Herb  DIAGNOSIS: 77 year old female with new diagnosis of invasive ductal carcinoma that is ER positive PR positive HER-2/neu positive by [facility] Testing  PRIOR THERAPY:  #1 patient was originally seenBy me on 02/21/2012 when she was diagnosed with invasive ductal carcinoma of the right breast stage II a with a positive lymph node. She underwent a lumpectomy and sentinel lymph node biopsy.The final pathology revealed a 1.8 cm invasive ductal carcinoma with low grade ductal carcinoma in situ the tumor was grade 2 ER positive PR positive Ki-67 was 11%. HER-2/neu was equivocal by cish.  #2 she had a sentinel lymph node biopsy one node was positive for metastatic disease. Making her a stage II A.  #3 patient and I and her family had an extensive discussion at the original meeting and we sent her tumor for HER-2/neu testing by [facility]. By [facility]  HER-2/neu is positive.  #4 patient is now going to be receiving combination chemotherapy consisting of Taxotere carboplatinum and Herceptin Beginning 03/30/2012 She will receive TCH combination every 21 days. She will receive day 2 Neulasta when she receives [facility]. On other weeks she will receive weekly Herceptin. A total of 6 cycles Of TCH is planned. Once she completes this she will then proceed with radiation therapy and we will plan on changing her Herceptin to every 3 weeks to complete out one year of therapy.  CURRENT THERAPY: Cycle 6 day 15 of TCH with weekly herceptin  INTERVAL HISTORY: Nealie Mchatton Derouin 77 y.o. female returns for followup visit today for her breast cancer.  She received her sixth cycle of Taxotere/carbo/herceptin two weeks ago.  She was very weak and dehydrated last week, however today is feeling much improved.  She denies fevers, chills, nausea, vomiting, constipation, diarrhea, numbness.  Her taste is  returning.  She will undergo radiation therapy simulation on 5/19.  Otherwise,a 10 point ROS is neg.   MEDICAL HISTORY: Past Medical History  Diagnosis Date  . Hyperlipidemia     doesn't require meds  . Asthma     pt states its "not bad"  . Back pain     buldging disc and pt says 2 are "flat"  . Hemorrhoids   . History of colon polyps   . Diverticulosis   . Breast cancer     breast right  . Hard of hearing   . Diabetes mellitus without complication     takes Metformin daily,Type II  . Allergy   . Cataract     b/l surgery  . Hypothyroidism     hx of;but has been off of meds >105yrs ago  . Wears glasses   . Hypertension     history, was on b/p  meds years ago  . PONV (postoperative nausea and vomiting)   . History of chemotherapy 03/30/2012-07/20/2012    6 cycles of taxotere, carboplatinum, herceptin    ALLERGIES:  has No Known Allergies.  MEDICATIONS:  Current Outpatient Prescriptions  Medication Sig Dispense Refill  . aspirin 81 MG tablet Take 81 mg by mouth daily.      . B Complex-C (SUPER B COMPLEX PO) Take 1 capsule by mouth daily.      . carvedilol (COREG) 6.25 MG tablet Take 1 tablet (6.25 mg total) by mouth 2 (two) times daily with a meal.  60 tablet  3  . CINNAMON PO Take 1 tablet by mouth 2 (  two) times daily.       Marland Kitchen lidocaine-prilocaine (EMLA) cream Apply topically as needed.  30 g  7  . metFORMIN (GLUCOPHAGE) 1000 MG tablet Take 1,000 mg by mouth 2 (two) times daily with a meal.      . Multiple Vitamins-Minerals (MULTIVITAMIN WITH MINERALS) tablet Take 1 tablet by mouth daily.      . naproxen sodium (ANAPROX) 220 MG tablet Take 220 mg by mouth as needed.      . Nutritional Supplements (FIBER FORMULA) POWD Take 30 mLs by mouth daily. tkes 2-3 tablespoons daily for constipation      . Vitamins C E (CRANBERRY CONCENTRATE PO) Take 1 tablet by mouth 2 (two) times daily.       Marland Kitchen LORazepam (ATIVAN) 0.5 MG tablet Take 1 tablet (0.5 mg total) by mouth every 6 (six) hours  as needed (Nausea or vomiting).  30 tablet  1   No current facility-administered medications for this visit.    SURGICAL HISTORY:  Past Surgical History  Procedure Laterality Date  . Nose surgery  1953  . Tonsillectomy  1955  . Nasal fracture surgery      as a child x 2  . Tonsillectomy    . Breast surgery  70's    right  . Appendectomy    . Bilateral cataract surgery    . Colonoscopy    . Neuroplasty / transposition median nerve at carpal tunnel bilateral    . Cardiac catheterization  20+yrs ago  . Breast lumpectomy with needle localization and axillary sentinel lymph node bx  02/07/2012    Procedure: BREAST LUMPECTOMY WITH NEEDLE LOCALIZATION AND AXILLARY SENTINEL LYMPH NODE BX;  Surgeon: Currie Paris, MD;  Location: [facility] OR;  Service: General;  Laterality: Right;  Right needle loc lumpectomy and Senitel Lymph Node Biopsy   . Abdominal hysterectomy      ovaries intact  . Portacath placement  03/26/2012    Procedure: INSERTION PORT-A-CATH;  Surgeon: Currie Paris, MD;  Location: [facility];  Service: General;  Laterality: Left;    REVIEW OF SYSTEMS:   General: fatigue (+), night sweats (-), fever (-), pain (-) Lymph: palpable nodes (-) HEENT: vision changes (-), mucositis (-), gum bleeding (-), epistaxis (-) Cardiovascular: chest pain (-), palpitations (-) Pulmonary: shortness of breath (-), dyspnea on exertion (-), cough (-), hemoptysis (-) GI:  Early satiety (-), melena (-), dysphagia (-), nausea/vomiting (-), diarrhea (-) GU: dysuria (-), hematuria (-), incontinence (-) Musculoskeletal: joint swelling (-), joint pain (-), back pain (-) Neuro: weakness (-), numbness (-), headache (-), confusion (-) Skin: Rash (-), lesions (-), dryness (-) Psych: depression (-), suicidal/homicidal ideation (-), feeling of hopelessness (-)  PHYSICAL EXAMINATION: Blood pressure 123/76, pulse 73, temperature 98.4 F (36.9 C), temperature source Oral, resp. rate 20,  height 5\' 5"  (1.651 m), weight 185 lb 3.2 oz (84.006 kg). Body mass index is 30.82 kg/(m^2). General: Patient is a well appearing female in no acute distress HEENT: PERRLA, sclerae anicteric no conjunctival pallor, MMM Neck: supple, right swollen lymph node in cervical chain, tender to palpation, approx .5 cm Lungs: clear to auscultation bilaterally, no wheezes, rhonchi, or rales Cardiovascular: regular rate rhythm, S1, S2, no murmurs, rubs or gallops Abdomen: Soft, non-tender, non-distended, normoactive bowel sounds, no HSM Extremities: warm and well perfused, no clubbing, cyanosis, or edema Skin: No rashes or lesions Neuro: Non-focal Right breast lumpectomy site without nodularity ECOG PERFORMANCE STATUS: 0 - Asymptomatic      LABORATORY  DATA: Lab Results  Component Value Date   WBC 13.9* 07/27/2012   HGB 10.1* 07/27/2012   HCT 30.6* 07/27/2012   MCV 101.3* 07/27/2012   PLT 187 07/27/2012      Chemistry      Component Value Date/Time   NA 136 07/20/2012 0853   NA 138 01/31/2012 1030   K 4.1 07/20/2012 0853   K 4.9 01/31/2012 1030   CL 98 07/20/2012 0853   CL 98 01/31/2012 1030   CO2 26 07/20/2012 0853   CO2 28 01/31/2012 1030   BUN 20.7 07/20/2012 0853   BUN 18 01/31/2012 1030   CREATININE 0.7 07/20/2012 0853   CREATININE 0.78 01/31/2012 1030      Component Value Date/Time   CALCIUM 8.7 07/20/2012 0853   CALCIUM 9.8 01/31/2012 1030   ALKPHOS 120 07/20/2012 0853   ALKPHOS 75 01/31/2012 1030   AST 18 07/20/2012 0853   AST 32 01/31/2012 1030   ALT 23 07/20/2012 0853   ALT 23 01/31/2012 1030   BILITOT 0.42 07/20/2012 0853   BILITOT 0.4 01/31/2012 1030       RADIOGRAPHIC STUDIES:  No results found.  ASSESSMENT: 77 year old female with  #1 stage II A. (T1 C. N1 a  cM0) invasive ductal carcinoma Measuring 1.8 cm ER positive PR positive HER-2/neu positive. One sentinel node was positive for metastatic disease.  #2 patient is a good candidate for adjuvant HER-2-based therapy. We discussed  treatment with Taxotere carboplatinum and Herceptin. The Taxotere carboplatinum will be given every 3 weeks with day 2 Neulasta for a total of 6 cycles. The Herceptin will be given weekly for the duration of the chemotherapy and then changed to every 3 weeks to complete one year of Herceptin therapy.  #3 because patient has had a lumpectomy she will also require radiation therapy the patient has been seen by Dr. Ballard Russell regarding this we will refer her back to Dr. Dayton Scrape after she completes her chemotherapy.  PLAN:   #1Ms. Arsenau will proceed with Herceptin today.  She is doing well today.    #2 We will see patient back on 5/23 for her every 3 week Herceptin to start.    All questions were answered. The patient knows to call the clinic with any problems, questions or concerns. We can certainly see the patient much sooner if necessary.  I spent 15 minutes counseling the patient face to face. The total time spent in the appointment was 30 minutes.  Cherie Ouch Lyn Hollingshead, NP Medical Oncology [facility] Phone: [phone]

## 2012-08-13 ENCOUNTER — Ambulatory Visit
Admission: RE | Admit: 2012-08-13 | Discharge: 2012-08-13 | Disposition: A | Discharge status: Home / Self Care | Payer: Medicare Other | Source: Ambulatory Visit | Attending: Radiation Oncology | Admitting: Radiation Oncology

## 2012-08-13 DIAGNOSIS — C50919 Malignant neoplasm of unspecified site of unspecified female breast: Secondary | ICD-10-CM | POA: Insufficient documentation

## 2012-08-13 DIAGNOSIS — C50911 Malignant neoplasm of unspecified site of right female breast: Secondary | ICD-10-CM

## 2012-08-13 DIAGNOSIS — Z510 Encounter for antineoplastic radiation therapy: Secondary | ICD-10-CM | POA: Insufficient documentation

## 2012-08-13 DIAGNOSIS — N3090 Cystitis, unspecified without hematuria: Secondary | ICD-10-CM | POA: Insufficient documentation

## 2012-08-13 NOTE — Progress Notes (Signed)
Complex simulation/treatment planning note: The patient was taken to the CT simulator. A custom neck mold was constructed on a custom breast board for immobilization. Her right breast field borders were marked with radiopaque wires. Her partial mastectomy scar was also marked with a radiopaque wire. She was then scanned. I chose an isocenter for a 4 field single isocenter setup she was set up to medial and lateral right breast tangents and 2 multileaf collimators were designed to conform the field. She was then set up to LAO to the right supraclavicular/axillary region with a separate multileaf collimator, and then a PA right axillary field with another unique multileaf collimator to conform the field. She had a total of 5 complex treatment devices. I prescribing 5040 cGy to her right breast and right axilla/supraclavicular region and 28 sessions. He will not be a boost. She is now ready for treatment planning.

## 2012-08-16 ENCOUNTER — Other Ambulatory Visit: Payer: Self-pay | Admitting: Emergency Medicine

## 2012-08-16 DIAGNOSIS — C50911 Malignant neoplasm of unspecified site of right female breast: Secondary | ICD-10-CM

## 2012-08-17 ENCOUNTER — Other Ambulatory Visit (HOSPITAL_BASED_OUTPATIENT_CLINIC_OR_DEPARTMENT_OTHER): Payer: Medicare Other | Admitting: Lab

## 2012-08-17 ENCOUNTER — Encounter: Payer: Self-pay | Admitting: Oncology

## 2012-08-17 ENCOUNTER — Ambulatory Visit (HOSPITAL_BASED_OUTPATIENT_CLINIC_OR_DEPARTMENT_OTHER): Payer: Medicare Other | Admitting: Oncology

## 2012-08-17 ENCOUNTER — Ambulatory Visit (HOSPITAL_BASED_OUTPATIENT_CLINIC_OR_DEPARTMENT_OTHER): Payer: Medicare Other

## 2012-08-17 VITALS — BP 148/64 | HR 83 | Temp 97.7°F | Resp 20 | Ht 65.0 in | Wt 188.1 lb

## 2012-08-17 DIAGNOSIS — Z170 Estrogen receptor positive status [ER+]: Secondary | ICD-10-CM

## 2012-08-17 DIAGNOSIS — C50911 Malignant neoplasm of unspecified site of right female breast: Secondary | ICD-10-CM

## 2012-08-17 DIAGNOSIS — Z5112 Encounter for antineoplastic immunotherapy: Secondary | ICD-10-CM

## 2012-08-17 DIAGNOSIS — C50419 Malignant neoplasm of upper-outer quadrant of unspecified female breast: Secondary | ICD-10-CM

## 2012-08-17 DIAGNOSIS — C773 Secondary and unspecified malignant neoplasm of axilla and upper limb lymph nodes: Secondary | ICD-10-CM

## 2012-08-17 LAB — CBC WITH DIFFERENTIAL/PLATELET
Basophils Absolute: 0.1 10*3/uL (ref 0.0–0.1)
EOS%: 5.9 % (ref 0.0–7.0)
Eosinophils Absolute: 0.4 10*3/uL (ref 0.0–0.5)
HCT: 33.8 % — ABNORMAL LOW (ref 34.8–46.6)
HGB: 11 g/dL — ABNORMAL LOW (ref 11.6–15.9)
LYMPH%: 31.6 % (ref 14.0–49.7)
MCH: 32.8 pg (ref 25.1–34.0)
MCV: 100.9 fL (ref 79.5–101.0)
MONO%: 9.3 % (ref 0.0–14.0)
NEUT#: 3.7 10*3/uL (ref 1.5–6.5)
NEUT%: 52.5 % (ref 38.4–76.8)
Platelets: 271 10*3/uL (ref 145–400)
RDW: 14.2 % (ref 11.2–14.5)

## 2012-08-17 LAB — COMPREHENSIVE METABOLIC PANEL (CC13)
Alkaline Phosphatase: 77 U/L (ref 40–150)
BUN: 14.7 mg/dL (ref 7.0–26.0)
Glucose: 102 mg/dl — ABNORMAL HIGH (ref 70–99)
Total Bilirubin: 0.29 mg/dL (ref 0.20–1.20)

## 2012-08-17 MED ORDER — TRASTUZUMAB CHEMO INJECTION 440 MG
6.0000 mg/kg | Freq: Once | INTRAVENOUS | Status: AC
  Administered 2012-08-17: 504 mg via INTRAVENOUS
  Filled 2012-08-17: qty 24

## 2012-08-17 MED ORDER — ACETAMINOPHEN 325 MG PO TABS
650.0000 mg | ORAL_TABLET | Freq: Once | ORAL | Status: AC
  Administered 2012-08-17: 650 mg via ORAL

## 2012-08-17 MED ORDER — HEPARIN SOD (PORK) LOCK FLUSH 100 UNIT/ML IV SOLN
500.0000 [IU] | Freq: Once | INTRAVENOUS | Status: AC | PRN
  Administered 2012-08-17: 500 [IU]
  Filled 2012-08-17: qty 5

## 2012-08-17 MED ORDER — DIPHENHYDRAMINE HCL 25 MG PO CAPS
50.0000 mg | ORAL_CAPSULE | Freq: Once | ORAL | Status: AC
  Administered 2012-08-17: 25 mg via ORAL

## 2012-08-17 MED ORDER — SODIUM CHLORIDE 0.9 % IJ SOLN
10.0000 mL | INTRAMUSCULAR | Status: DC | PRN
  Administered 2012-08-17: 10 mL
  Filled 2012-08-17: qty 10

## 2012-08-17 MED ORDER — SODIUM CHLORIDE 0.9 % IV SOLN
Freq: Once | INTRAVENOUS | Status: AC
  Administered 2012-08-17: 15:00:00 via INTRAVENOUS

## 2012-08-17 NOTE — Patient Instructions (Addendum)
[facility] Discharge Instructions for Patients Receiving Chemotherapy  Today you received the following chemotherapy agents herceptin   If you develop nausea and vomiting that is not controlled by your nausea medication, call the clinic. If it is after clinic hours your family physician or the after hours number for the clinic or go to the Emergency Department.   BELOW ARE SYMPTOMS THAT SHOULD BE REPORTED IMMEDIATELY:  *FEVER GREATER THAN 100.5 F  *CHILLS WITH OR WITHOUT FEVER  NAUSEA AND VOMITING THAT IS NOT CONTROLLED WITH YOUR NAUSEA MEDICATION  *UNUSUAL SHORTNESS OF BREATH  *UNUSUAL BRUISING OR BLEEDING  TENDERNESS IN MOUTH AND THROAT WITH OR WITHOUT PRESENCE OF ULCERS  *URINARY PROBLEMS  *BOWEL PROBLEMS  UNUSUAL RASH Items with * indicate a potential emergency and should be followed up as soon as possible.   Feel free to call the clinic you have any questions or concerns. The clinic phone number is [phone].   I have been informed and understand all the instructions given to me. I know to contact the clinic, my physician, or go to the Emergency Department if any problems should occur. I do not have any questions at this time, but understand that I may call the clinic during office hours   should I have any questions or need assistance in obtaining follow up care.    __________________________________________  _____________  __________ Signature of Patient or Authorized Representative            Date                   Time    __________________________________________ Nurse's Signature    

## 2012-08-22 ENCOUNTER — Ambulatory Visit
Admission: RE | Admit: 2012-08-22 | Discharge: 2012-08-22 | Disposition: A | Discharge status: Home / Self Care | Payer: Medicare Other | Source: Ambulatory Visit | Attending: Radiation Oncology | Admitting: Radiation Oncology

## 2012-08-22 DIAGNOSIS — C50911 Malignant neoplasm of unspecified site of right female breast: Secondary | ICD-10-CM

## 2012-08-22 MED ORDER — RADIAPLEXRX EX GEL
Freq: Once | CUTANEOUS | Status: AC
  Administered 2012-08-22: 1 via TOPICAL

## 2012-08-22 MED ORDER — ALRA NON-METALLIC DEODORANT (RAD-ONC)
1.0000 "application " | Freq: Once | TOPICAL | Status: AC
  Administered 2012-08-22: 1 via TOPICAL

## 2012-08-22 NOTE — Progress Notes (Signed)
Routine of clinic reviewed.Informed she will see Dr.Murray mainly every Monday after radiation treatment.Patient education performed.Gien Radiation therapy and You Booklet, radiaplex and alra deodorant.Instructed on side effects of radiation and skin care.Patient given schedule and number to call if questions regarding appointment or problems.

## 2012-08-22 NOTE — Progress Notes (Signed)
Simulation verification note: The patient underwent simulation verification today for treatment to her right breast and regional lymph nodes. Her isocenter is in good position and the multileaf collimators for her tangential and nodal fields contoured the treatment volume appropriately.

## 2012-08-23 ENCOUNTER — Ambulatory Visit
Admission: RE | Admit: 2012-08-23 | Discharge: 2012-08-23 | Disposition: A | Discharge status: Home / Self Care | Payer: Medicare Other | Source: Ambulatory Visit | Attending: Radiation Oncology | Admitting: Radiation Oncology

## 2012-08-24 ENCOUNTER — Ambulatory Visit
Admission: RE | Admit: 2012-08-24 | Discharge: 2012-08-24 | Disposition: A | Discharge status: Home / Self Care | Payer: Medicare Other | Source: Ambulatory Visit | Attending: Radiation Oncology | Admitting: Radiation Oncology

## 2012-08-27 ENCOUNTER — Ambulatory Visit
Admission: RE | Admit: 2012-08-27 | Discharge: 2012-08-27 | Disposition: A | Discharge status: Home / Self Care | Payer: Medicare Other | Source: Ambulatory Visit | Attending: Radiation Oncology | Admitting: Radiation Oncology

## 2012-08-27 VITALS — BP 131/61 | HR 79 | Temp 98.1°F | Wt 190.6 lb

## 2012-08-27 DIAGNOSIS — C50911 Malignant neoplasm of unspecified site of right female breast: Secondary | ICD-10-CM

## 2012-08-27 NOTE — Progress Notes (Signed)
Weekly Management Note:  Site: Right breast/regional lymph nodes Current Dose:  540  cGy Projected Dose: 5040  cGy  Narrative: The patient is seen today for routine under treatment assessment. CBCT/MVCT images/port films were reviewed. The chart was reviewed.   No complaints today . Her setup is excellent.  Physical Examination:  Filed Vitals:   08/27/12 1133  BP: 131/61  Pulse: 79  Temp: 98.1 F (36.7 C)  .  Weight: 190 lb 9.6 oz (86.456 kg). No significant skin changes.  Impression: Tolerating radiation therapy well.  Plan: Continue radiation therapy as planned.

## 2012-08-27 NOTE — Progress Notes (Signed)
Patient for weekly assessment of right breast radiation.Completed 3 of 28.No skin changes.Improvement of fatigue from chemotherapy.Denies pain.Feels great.

## 2012-08-28 ENCOUNTER — Ambulatory Visit
Admission: RE | Admit: 2012-08-28 | Discharge: 2012-08-28 | Disposition: A | Discharge status: Home / Self Care | Payer: Medicare Other | Source: Ambulatory Visit | Attending: Radiation Oncology | Admitting: Radiation Oncology

## 2012-08-29 ENCOUNTER — Ambulatory Visit
Admission: RE | Admit: 2012-08-29 | Discharge: 2012-08-29 | Disposition: A | Discharge status: Home / Self Care | Payer: Medicare Other | Source: Ambulatory Visit | Attending: Radiation Oncology | Admitting: Radiation Oncology

## 2012-08-30 ENCOUNTER — Ambulatory Visit
Admission: RE | Admit: 2012-08-30 | Discharge: 2012-08-30 | Disposition: A | Discharge status: Home / Self Care | Payer: Medicare Other | Source: Ambulatory Visit | Attending: Radiation Oncology | Admitting: Radiation Oncology

## 2012-08-31 ENCOUNTER — Ambulatory Visit
Admission: RE | Admit: 2012-08-31 | Discharge: 2012-08-31 | Disposition: A | Discharge status: Home / Self Care | Payer: Medicare Other | Source: Ambulatory Visit | Attending: Radiation Oncology | Admitting: Radiation Oncology

## 2012-09-03 ENCOUNTER — Ambulatory Visit
Admission: RE | Admit: 2012-09-03 | Discharge: 2012-09-03 | Disposition: A | Discharge status: Home / Self Care | Payer: Medicare Other | Source: Ambulatory Visit | Attending: Radiation Oncology | Admitting: Radiation Oncology

## 2012-09-03 ENCOUNTER — Encounter: Payer: Self-pay | Admitting: Radiation Oncology

## 2012-09-03 VITALS — BP 135/79 | HR 77 | Temp 98.2°F | Resp 20 | Wt 189.9 lb

## 2012-09-03 DIAGNOSIS — C50911 Malignant neoplasm of unspecified site of right female breast: Secondary | ICD-10-CM

## 2012-09-03 NOTE — Progress Notes (Signed)
Pt denies pain, fatigue, loss of appetite. She states she has not begun applying Radiaplex lotion yet, no skin changes.

## 2012-09-03 NOTE — Progress Notes (Signed)
Weekly Management Note:  Site: Right breast/regional lymph nodes Current Dose:  1440  cGy Projected Dose: 5040  CGy (no boost)  Narrative: The patient is seen today for routine under treatment assessment. CBCT/MVCT images/port films were reviewed. The chart was reviewed.   She is without complaints today. She has not started Radioplex gel appear  Physical Examination:  Filed Vitals:   09/03/12 1101  BP: 135/79  Pulse: 77  Temp: 98.2 F (36.8 C)  Resp: 20  .  Weight: 189 lb 14.4 oz (86.138 kg). There is mild to moderate erythema along the right breast/axilla with no areas of desquamation.  Impression: Tolerating radiation therapy well.  Plan: Continue radiation therapy as planned.

## 2012-09-04 ENCOUNTER — Ambulatory Visit
Admission: RE | Admit: 2012-09-04 | Discharge: 2012-09-04 | Disposition: A | Discharge status: Home / Self Care | Payer: Medicare Other | Source: Ambulatory Visit | Attending: Radiation Oncology | Admitting: Radiation Oncology

## 2012-09-05 ENCOUNTER — Ambulatory Visit
Admission: RE | Admit: 2012-09-05 | Discharge: 2012-09-05 | Disposition: A | Discharge status: Home / Self Care | Payer: Medicare Other | Source: Ambulatory Visit | Attending: Radiation Oncology | Admitting: Radiation Oncology

## 2012-09-06 ENCOUNTER — Ambulatory Visit
Admission: RE | Admit: 2012-09-06 | Discharge: 2012-09-06 | Disposition: A | Discharge status: Home / Self Care | Payer: Medicare Other | Source: Ambulatory Visit | Attending: Radiation Oncology | Admitting: Radiation Oncology

## 2012-09-06 ENCOUNTER — Other Ambulatory Visit: Payer: Self-pay | Admitting: *Deleted

## 2012-09-06 DIAGNOSIS — C50911 Malignant neoplasm of unspecified site of right female breast: Secondary | ICD-10-CM

## 2012-09-07 ENCOUNTER — Encounter: Payer: Self-pay | Admitting: Adult Health

## 2012-09-07 ENCOUNTER — Telehealth: Payer: Self-pay | Admitting: *Deleted

## 2012-09-07 ENCOUNTER — Ambulatory Visit (HOSPITAL_BASED_OUTPATIENT_CLINIC_OR_DEPARTMENT_OTHER): Payer: Medicare Other

## 2012-09-07 ENCOUNTER — Ambulatory Visit
Admission: RE | Admit: 2012-09-07 | Discharge: 2012-09-07 | Disposition: A | Discharge status: Home / Self Care | Payer: Medicare Other | Source: Ambulatory Visit | Attending: Radiation Oncology | Admitting: Radiation Oncology

## 2012-09-07 ENCOUNTER — Ambulatory Visit (HOSPITAL_BASED_OUTPATIENT_CLINIC_OR_DEPARTMENT_OTHER): Payer: Medicare Other | Admitting: Adult Health

## 2012-09-07 ENCOUNTER — Other Ambulatory Visit (HOSPITAL_BASED_OUTPATIENT_CLINIC_OR_DEPARTMENT_OTHER): Payer: Medicare Other

## 2012-09-07 VITALS — BP 132/77 | HR 81 | Temp 98.2°F | Resp 18 | Ht 65.0 in | Wt 186.5 lb

## 2012-09-07 DIAGNOSIS — C50419 Malignant neoplasm of upper-outer quadrant of unspecified female breast: Secondary | ICD-10-CM

## 2012-09-07 DIAGNOSIS — C50911 Malignant neoplasm of unspecified site of right female breast: Secondary | ICD-10-CM

## 2012-09-07 DIAGNOSIS — Z170 Estrogen receptor positive status [ER+]: Secondary | ICD-10-CM

## 2012-09-07 DIAGNOSIS — Z5112 Encounter for antineoplastic immunotherapy: Secondary | ICD-10-CM

## 2012-09-07 LAB — COMPREHENSIVE METABOLIC PANEL (CC13)
AST: 20 U/L (ref 5–34)
Alkaline Phosphatase: 69 U/L (ref 40–150)
BUN: 15.4 mg/dL (ref 7.0–26.0)
Calcium: 9.5 mg/dL (ref 8.4–10.4)
Chloride: 102 mEq/L (ref 98–107)
Creatinine: 0.8 mg/dL (ref 0.6–1.1)
Total Bilirubin: 0.38 mg/dL (ref 0.20–1.20)

## 2012-09-07 LAB — CBC WITH DIFFERENTIAL/PLATELET
Basophils Absolute: 0 10*3/uL (ref 0.0–0.1)
EOS%: 9.5 % — ABNORMAL HIGH (ref 0.0–7.0)
HCT: 37 % (ref 34.8–46.6)
HGB: 12.2 g/dL (ref 11.6–15.9)
LYMPH%: 17.1 % (ref 14.0–49.7)
MCH: 31.9 pg (ref 25.1–34.0)
MCV: 96.9 fL (ref 79.5–101.0)
MONO%: 8.6 % (ref 0.0–14.0)
NEUT%: 64.5 % (ref 38.4–76.8)
Platelets: 229 10*3/uL (ref 145–400)

## 2012-09-07 MED ORDER — ACETAMINOPHEN 325 MG PO TABS
650.0000 mg | ORAL_TABLET | Freq: Once | ORAL | Status: AC
Start: 2012-09-07 — End: 2012-09-07
  Administered 2012-09-07: 650 mg via ORAL

## 2012-09-07 MED ORDER — SODIUM CHLORIDE 0.9 % IJ SOLN
10.0000 mL | INTRAMUSCULAR | Status: DC | PRN
  Administered 2012-09-07: 10 mL
  Filled 2012-09-07: qty 10

## 2012-09-07 MED ORDER — SODIUM CHLORIDE 0.9 % IV SOLN
Freq: Once | INTRAVENOUS | Status: AC
  Administered 2012-09-07: 15:00:00 via INTRAVENOUS

## 2012-09-07 MED ORDER — DIPHENHYDRAMINE HCL 25 MG PO CAPS
50.0000 mg | ORAL_CAPSULE | Freq: Once | ORAL | Status: AC
  Administered 2012-09-07: 25 mg via ORAL

## 2012-09-07 MED ORDER — HEPARIN SOD (PORK) LOCK FLUSH 100 UNIT/ML IV SOLN
500.0000 [IU] | Freq: Once | INTRAVENOUS | Status: AC | PRN
  Administered 2012-09-07: 500 [IU]
  Filled 2012-09-07: qty 5

## 2012-09-07 MED ORDER — SODIUM CHLORIDE 0.9 % IV SOLN
6.0000 mg/kg | Freq: Once | INTRAVENOUS | Status: AC
  Administered 2012-09-07: 504 mg via INTRAVENOUS
  Filled 2012-09-07: qty 24

## 2012-09-07 NOTE — Telephone Encounter (Signed)
Per staff phone call and POF I have schedueld appts.  JMW  

## 2012-09-07 NOTE — Patient Instructions (Addendum)
[facility] Discharge Instructions for Patients Receiving Chemotherapy  Today you received the following chemotherapy agents Herceptin.  To help prevent nausea and vomiting after your treatment, we encourage you to take your nausea medication as prescribed.   If you develop nausea and vomiting that is not controlled by your nausea medication, call the clinic.   BELOW ARE SYMPTOMS THAT SHOULD BE REPORTED IMMEDIATELY:  *FEVER GREATER THAN 100.5 F  *CHILLS WITH OR WITHOUT FEVER  NAUSEA AND VOMITING THAT IS NOT CONTROLLED WITH YOUR NAUSEA MEDICATION  *UNUSUAL SHORTNESS OF BREATH  *UNUSUAL BRUISING OR BLEEDING  TENDERNESS IN MOUTH AND THROAT WITH OR WITHOUT PRESENCE OF ULCERS  *URINARY PROBLEMS  *BOWEL PROBLEMS  UNUSUAL RASH Items with * indicate a potential emergency and should be followed up as soon as possible.  Feel free to call the clinic you have any questions or concerns. The clinic phone number is [phone].    

## 2012-09-07 NOTE — Patient Instructions (Addendum)
Doing well.  Proceed with Herceptin.  Please call us if you have any questions or concerns.     Letrozole tablets What is this medicine? LETROZOLE (LET roe zole) blocks the production of estrogen. Certain types of breast cancer grow under the influence of estrogen. Letrozole helps block tumor growth. This medicine is used to treat advanced breast cancer in postmenopausal women. This medicine may be used for other purposes; ask your health care provider or pharmacist if you have questions. What should I tell my health care provider before I take this medicine? They need to know if you have any of these conditions: -liver disease -osteoporosis (weak bones) -an unusual or allergic reaction to letrozole, other medicines, foods, dyes, or preservatives -pregnant or trying to get pregnant -breast-feeding How should I use this medicine? Take this medicine by mouth with a glass of water. You may take it with or without food. Follow the directions on the prescription label. Take your medicine at regular intervals. Do not take your medicine more often than directed. Do not stop taking except on your doctor's advice. Talk to your pediatrician regarding the use of this medicine in children. Special care may be needed. Overdosage: If you think you have taken too much of this medicine contact a poison control center or emergency room at once. NOTE: This medicine is only for you. Do not share this medicine with others. What if I miss a dose? If you miss a dose, take it as soon as you can. If it is almost time for your next dose, take only that dose. Do not take double or extra doses. What may interact with this medicine? Do not take this medicine with any of the following medications: -estrogens, like hormone replacement therapy or birth control pills This medicine may also interact with the following medications: -dietary supplements such as androstenedione or DHEA -prasterone -tamoxifen This list may  not describe all possible interactions. Give your health care provider a list of all the medicines, herbs, non-prescription drugs, or dietary supplements you use. Also tell them if you smoke, drink alcohol, or use illegal drugs. Some items may interact with your medicine. What should I watch for while using this medicine? Visit your doctor or health care professional for regular check-ups to monitor your condition. Do not use this drug if you are pregnant. Serious side effects to an unborn child are possible. Talk to your doctor or pharmacist for more information. You may get drowsy or dizzy. Do not drive, use machinery, or do anything that needs mental alertness until you know how this medicine affects you. Do not stand or sit up quickly, especially if you are an older patient. This reduces the risk of dizzy or fainting spells. What side effects may I notice from receiving this medicine? Side effects that you should report to your doctor or health care professional as soon as possible: -allergic reactions like skin rash, itching, or hives -bone fracture -chest pain -difficulty breathing or shortness of breath -severe pain, swelling, warmth in the leg -unusually weak or tired -vaginal bleeding Side effects that usually do not require medical attention (report to your doctor or health care professional if they continue or are bothersome): -bone, back, joint, or muscle pain -dizziness -fatigue -fluid retention -headache -hot flashes, night sweats -nausea -weight gain This list may not describe all possible side effects. Call your doctor for medical advice about side effects. You may report side effects to FDA at 1-800-FDA-1088. Where should I keep my medicine?  Keep out of the reach of children. Store between 15 and 30 degrees C (59 and 86 degrees F). Throw away any unused medicine after the expiration date. NOTE: This sheet is a summary. It may not cover all possible information. If you have  questions about this medicine, talk to your doctor, pharmacist, or health care provider.  2012, [employer]. (05/25/2007 4:43:44 PM)

## 2012-09-07 NOTE — Progress Notes (Signed)
OFFICE PROGRESS NOTE  CC  [facility], MD [address] [place] [address] Dr. Cyndia Bent Dr. Chipper Herb  DIAGNOSIS: 77 year old female with new diagnosis of invasive ductal carcinoma that is ER positive PR positive HER-2/neu positive by [facility] Testing  PRIOR THERAPY:  #1 patient was originally seenBy me on 02/21/2012 when she was diagnosed with invasive ductal carcinoma of the right breast stage II a with a positive lymph node. She underwent a lumpectomy and sentinel lymph node biopsy.The final pathology revealed a 1.8 cm invasive ductal carcinoma with low grade ductal carcinoma in situ the tumor was grade 2 ER positive PR positive Ki-67 was 11%. HER-2/neu was equivocal by cish.  #2 she had a sentinel lymph node biopsy one node was positive for metastatic disease. Making her a stage II A.  #3 patient and I and her family had an extensive discussion at the original meeting and we sent her tumor for HER-2/neu testing by [facility]. By [facility]  HER-2/neu is positive.  #4 patient is now going to be receiving combination chemotherapy consisting of Taxotere carboplatinum and Herceptin Beginning 03/30/2012 She will receive TCH combination every 21 days. She will receive day 2 Neulasta when she receives [facility]. On other weeks she will receive weekly Herceptin. A total of 6 cycles Of TCH is planned. Once she completes this she will then proceed with radiation therapy and we will plan on changing her Herceptin to every 3 weeks to complete out one year of therapy.  #5 Radiation therapy starting 08/23/12  #6 Every 3 week herceptin beginning 08/17/12.  CURRENT THERAPY: every 3 week Herceptin.   INTERVAL HISTORY: Heather Vega 77 y.o. female returns for followup visit today for her breast cancer.  She is doing well today.  She is here to receive adjuvant herceptin.  Her last echo was 07/16/12, EF was 60-65%.  She is feeling well, she denies fevers, chills, nausea, vomiting,  constipation, diarrhea, numbness, tingling, pain, palpitations, DOE, PND, orthopnea, lower extremity swelling, or any other concerns.   MEDICAL HISTORY: Past Medical History  Diagnosis Date  . Hyperlipidemia     doesn't require meds  . Asthma     pt states its "not bad"  . Back pain     buldging disc and pt says 2 are "flat"  . Hemorrhoids   . History of colon polyps   . Diverticulosis   . Breast cancer     breast right  . Hard of hearing   . Diabetes mellitus without complication     takes Metformin daily,Type II  . Allergy   . Cataract     b/l surgery  . Hypothyroidism     hx of;but has been off of meds >49yrs ago  . Wears glasses   . Hypertension     history, was on b/p  meds years ago  . PONV (postoperative nausea and vomiting)   . History of chemotherapy 03/30/2012-07/20/2012    6 cycles of taxotere, carboplatinum, herceptin    ALLERGIES:  has No Known Allergies.  MEDICATIONS:  Current Outpatient Prescriptions  Medication Sig Dispense Refill  . aspirin 81 MG tablet Take 81 mg by mouth daily.      . B Complex-C (SUPER B COMPLEX PO) Take 1 capsule by mouth daily.      . carvedilol (COREG) 6.25 MG tablet Take 1 tablet (6.25 mg total) by mouth 2 (two) times daily with a meal.  60 tablet  3  . CINNAMON PO Take 1 tablet  by mouth 2 (two) times daily.       . hyaluronate sodium (RADIAPLEXRX) GEL Apply topically 2 (two) times daily.      Marland Kitchen lidocaine-prilocaine (EMLA) cream Apply topically as needed.  30 g  7  . LORazepam (ATIVAN) 0.5 MG tablet Take 1 tablet (0.5 mg total) by mouth every 6 (six) hours as needed (Nausea or vomiting).  30 tablet  1  . metFORMIN (GLUCOPHAGE) 1000 MG tablet Take 1,000 mg by mouth 2 (two) times daily with a meal.      . Multiple Vitamins-Minerals (MULTIVITAMIN WITH MINERALS) tablet Take 1 tablet by mouth daily.      . naproxen sodium (ANAPROX) 220 MG tablet Take 220 mg by mouth as needed.      . Nutritional Supplements (FIBER FORMULA) POWD Take 30  mLs by mouth daily. tkes 2-3 tablespoons daily for constipation      . Vitamins C E (CRANBERRY CONCENTRATE PO) Take 1 tablet by mouth 2 (two) times daily.        No current facility-administered medications for this visit.    SURGICAL HISTORY:  Past Surgical History  Procedure Laterality Date  . Nose surgery  1953  . Tonsillectomy  1955  . Nasal fracture surgery      as a child x 2  . Tonsillectomy    . Breast surgery  70's    right  . Appendectomy    . Bilateral cataract surgery    . Colonoscopy    . Neuroplasty / transposition median nerve at carpal tunnel bilateral    . Cardiac catheterization  20+yrs ago  . Breast lumpectomy with needle localization and axillary sentinel lymph node bx  02/07/2012    Procedure: BREAST LUMPECTOMY WITH NEEDLE LOCALIZATION AND AXILLARY SENTINEL LYMPH NODE BX;  Surgeon: Currie Paris, MD;  Location: [facility] OR;  Service: General;  Laterality: Right;  Right needle loc lumpectomy and Senitel Lymph Node Biopsy   . Abdominal hysterectomy      ovaries intact  . Portacath placement  03/26/2012    Procedure: INSERTION PORT-A-CATH;  Surgeon: Currie Paris, MD;  Location: [facility];  Service: General;  Laterality: Left;    REVIEW OF SYSTEMS:   General: fatigue (+), night sweats (-), fever (-), pain (-) Lymph: palpable nodes (-) HEENT: vision changes (-), mucositis (-), gum bleeding (-), epistaxis (-) Cardiovascular: chest pain (-), palpitations (-) Pulmonary: shortness of breath (-), dyspnea on exertion (-), cough (-), hemoptysis (-) GI:  Early satiety (-), melena (-), dysphagia (-), nausea/vomiting (-), diarrhea (-) GU: dysuria (-), hematuria (-), incontinence (-) Musculoskeletal: joint swelling (-), joint pain (-), back pain (-) Neuro: weakness (-), numbness (-), headache (-), confusion (-) Skin: Rash (-), lesions (-), dryness (-) Psych: depression (-), suicidal/homicidal ideation (-), feeling of hopelessness (-)  PHYSICAL  EXAMINATION: Blood pressure 132/77, pulse 81, temperature 98.2 F (36.8 C), temperature source Oral, resp. rate 18, height 5\' 5"  (1.651 m), weight 186 lb 8 oz (84.596 kg). Body mass index is 31.04 kg/(m^2). General: Patient is a well appearing female in no acute distress HEENT: PERRLA, sclerae anicteric no conjunctival pallor, MMM Neck: supple, no lymphadenopathy Lungs: clear to auscultation bilaterally, no wheezes, rhonchi, or rales Cardiovascular: regular rate rhythm, S1, S2, no murmurs, rubs or gallops Abdomen: Soft, non-tender, non-distended, normoactive bowel sounds, no HSM Extremities: warm and well perfused, no clubbing, cyanosis, or edema Skin: No rashes or lesions Neuro: Non-focal Right breast lumpectomy site without nodularity ECOG PERFORMANCE STATUS: 0 -  Asymptomatic      LABORATORY DATA: Lab Results  Component Value Date   WBC 5.8 09/07/2012   HGB 12.2 09/07/2012   HCT 37.0 09/07/2012   MCV 96.9 09/07/2012   PLT 229 09/07/2012      Chemistry      Component Value Date/Time   NA 139 08/17/2012 1219   NA 138 01/31/2012 1030   K 4.2 08/17/2012 1219   K 4.9 01/31/2012 1030   CL 103 08/17/2012 1219   CL 98 01/31/2012 1030   CO2 27 08/17/2012 1219   CO2 28 01/31/2012 1030   BUN 14.7 08/17/2012 1219   BUN 18 01/31/2012 1030   CREATININE 0.7 08/17/2012 1219   CREATININE 0.78 01/31/2012 1030      Component Value Date/Time   CALCIUM 8.8 08/17/2012 1219   CALCIUM 9.8 01/31/2012 1030   ALKPHOS 77 08/17/2012 1219   ALKPHOS 75 01/31/2012 1030   AST 20 08/17/2012 1219   AST 32 01/31/2012 1030   ALT 20 08/17/2012 1219   ALT 23 01/31/2012 1030   BILITOT 0.29 08/17/2012 1219   BILITOT 0.4 01/31/2012 1030       RADIOGRAPHIC STUDIES:  No results found.  ASSESSMENT: 77 year old female with  #1 stage II A. (T1 C. N1 a  cM0) invasive ductal carcinoma Measuring 1.8 cm ER positive PR positive HER-2/neu positive. One sentinel node was positive for metastatic disease.  #2 patient is a good  candidate for adjuvant HER-2-based therapy. We discussed treatment with Taxotere carboplatinum and Herceptin. The Taxotere carboplatinum will be given every 3 weeks with day 2 Neulasta for a total of 6 cycles. The Herceptin will be given weekly for the duration of the chemotherapy and then changed to every 3 weeks to complete one year of Herceptin therapy.  #3 because patient has had a lumpectomy she will also require radiation therapy the patient has been seen by Dr. Ballard Russell regarding this we will refer her back to Dr. Dayton Scrape after she completes her chemotherapy.  PLAN:   #1Ms. Arsenau will proceed with Herceptin today.  She is doing well today.  We discussed anti-estrogen therapy that she will begin after radiation.  I gave her information in her AVS.    #2 We will see her back in 3 weeks for her next treatment.    All questions were answered. The patient knows to call the clinic with any problems, questions or concerns. We can certainly see the patient much sooner if necessary.  I spent 25 minutes counseling the patient face to face. The total time spent in the appointment was 30 minutes.  Cherie Ouch Lyn Hollingshead, NP Medical Oncology [facility] Phone: [phone]

## 2012-09-10 ENCOUNTER — Ambulatory Visit
Admission: RE | Admit: 2012-09-10 | Discharge: 2012-09-10 | Disposition: A | Discharge status: Home / Self Care | Payer: Medicare Other | Source: Ambulatory Visit | Attending: Radiation Oncology | Admitting: Radiation Oncology

## 2012-09-10 VITALS — BP 139/61 | HR 80 | Temp 98.3°F | Wt 188.7 lb

## 2012-09-10 DIAGNOSIS — C50911 Malignant neoplasm of unspecified site of right female breast: Secondary | ICD-10-CM

## 2012-09-10 NOTE — Progress Notes (Signed)
Weekly Management Note:  Site: Right breast/regional lymph nodes Current Dose:  2340  cGy Projected Dose: 5040  CGy, no boost  Narrative: The patient is seen today for routine under treatment assessment. CBCT/MVCT images/port films were reviewed. The chart was reviewed.   No complaints today. She has started to use Radioplex gel .  Physical Examination:  Filed Vitals:   09/10/12 0952  BP: 139/61  Pulse: 80  Temp: 98.3 F (36.8 C)  .  Weight: 188 lb 11.2 oz (85.594 kg). Minimal skin changes with slight erythema along the inframammary region and axilla. No areas of desquamation.  Impression: Tolerating radiation therapy well.  Plan: Continue radiation therapy as planned.

## 2012-09-10 NOTE — Progress Notes (Signed)
Patient here for weekly assessment of right breast radiation.Completed 13 of 28 treatments.Some mild nipple tenderness.Not much of change in skin.Continue application of radiaplex.Mild fatigue relieved with rest periods.

## 2012-09-11 ENCOUNTER — Ambulatory Visit
Admission: RE | Admit: 2012-09-11 | Discharge: 2012-09-11 | Disposition: A | Discharge status: Home / Self Care | Payer: Medicare Other | Source: Ambulatory Visit | Attending: Radiation Oncology | Admitting: Radiation Oncology

## 2012-09-12 ENCOUNTER — Ambulatory Visit
Admission: RE | Admit: 2012-09-12 | Discharge: 2012-09-12 | Disposition: A | Discharge status: Home / Self Care | Payer: Medicare Other | Source: Ambulatory Visit | Attending: Radiation Oncology | Admitting: Radiation Oncology

## 2012-09-13 ENCOUNTER — Ambulatory Visit
Admission: RE | Admit: 2012-09-13 | Discharge: 2012-09-13 | Disposition: A | Discharge status: Home / Self Care | Payer: Medicare Other | Source: Ambulatory Visit | Attending: Radiation Oncology | Admitting: Radiation Oncology

## 2012-09-14 ENCOUNTER — Ambulatory Visit
Admission: RE | Admit: 2012-09-14 | Discharge: 2012-09-14 | Disposition: A | Discharge status: Home / Self Care | Payer: Medicare Other | Source: Ambulatory Visit | Attending: Radiation Oncology | Admitting: Radiation Oncology

## 2012-09-16 NOTE — Progress Notes (Signed)
OFFICE PROGRESS NOTE  CC  [facility], MD [address] [place] [address] Dr. Cyndia Bent Dr. Chipper Herb  DIAGNOSIS: 77 year old female with new diagnosis of invasive ductal carcinoma that is ER positive PR positive HER-2/neu positive by [facility] Testing  PRIOR THERAPY:  #1 patient was originally seenBy me on 02/21/2012 when she was diagnosed with invasive ductal carcinoma of the right breast stage II a with a positive lymph node. She underwent a lumpectomy and sentinel lymph node biopsy.The final pathology revealed a 1.8 cm invasive ductal carcinoma with low grade ductal carcinoma in situ the tumor was grade 2 ER positive PR positive Ki-67 was 11%. HER-2/neu was equivocal by cish.  #2 she had a sentinel lymph node biopsy one node was positive for metastatic disease. Making her a stage II A.  #3 patient and I and her family had an extensive discussion at the original meeting and we sent her tumor for HER-2/neu testing by [facility]. By [facility]  HER-2/neu is positive.  #4 patient is now going to be receiving combination chemotherapy consisting of Taxotere carboplatinum and Herceptin Beginning 03/30/2012 She will receive TCH combination every 21 days. She will receive day 2 Neulasta when she receives [facility]. On other weeks she will receive weekly Herceptin. A total of 6 cycles Of TCH is planned. Her chemotherapy was completed on 08/10/2012  #5 patient now to begin Herceptin every 3 weeks beginning 08/17/2012.  CURRENT THERAPY: Herceptin every 3 weeks INTERVAL HISTORY: Heather Vega 77 y.o. female returns for followup visit today for her breast cancer. Overall patient is doing well she is now receiving Herceptin only to be given every 3 weeks. She is denying any nausea vomiting fevers chills night sweats. Overall she feels very well. She has no peripheral paresthesias no myalgias and arthralgias. She has been seen by Dr. Chipper Herb who has done simulation and she will  begin her radiation therapy adjuvantly on the 30th of this month. In the meantime she will continue to receive the Herceptin every 3 weeks concurrently. Once patient completes her radiation we will plan on putting her on antiestrogen therapy. Remainder of the 10 point review of systems is negative.  MEDICAL HISTORY: Past Medical History  Diagnosis Date  . Hyperlipidemia     doesn't require meds  . Asthma     pt states its "not bad"  . Back pain     buldging disc and pt says 2 are "flat"  . Hemorrhoids   . History of colon polyps   . Diverticulosis   . Breast cancer     breast right  . Hard of hearing   . Diabetes mellitus without complication     takes Metformin daily,Type II  . Allergy   . Cataract     b/l surgery  . Hypothyroidism     hx of;but has been off of meds >23yrs ago  . Wears glasses   . Hypertension     history, was on b/p  meds years ago  . PONV (postoperative nausea and vomiting)   . History of chemotherapy 03/30/2012-07/20/2012    6 cycles of taxotere, carboplatinum, herceptin    ALLERGIES:  has No Known Allergies.  MEDICATIONS:  Current Outpatient Prescriptions  Medication Sig Dispense Refill  . aspirin 81 MG tablet Take 81 mg by mouth daily.      . B Complex-C (SUPER B COMPLEX PO) Take 1 capsule by mouth daily.      . carvedilol (COREG) 6.25 MG tablet Take  1 tablet (6.25 mg total) by mouth 2 (two) times daily with a meal.  60 tablet  3  . CINNAMON PO Take 1 tablet by mouth 2 (two) times daily.       Marland Kitchen lidocaine-prilocaine (EMLA) cream Apply topically as needed.  30 g  7  . LORazepam (ATIVAN) 0.5 MG tablet Take 1 tablet (0.5 mg total) by mouth every 6 (six) hours as needed (Nausea or vomiting).  30 tablet  1  . metFORMIN (GLUCOPHAGE) 1000 MG tablet Take 1,000 mg by mouth 2 (two) times daily with a meal.      . Multiple Vitamins-Minerals (MULTIVITAMIN WITH MINERALS) tablet Take 1 tablet by mouth daily.      . naproxen sodium (ANAPROX) 220 MG tablet Take 220 mg  by mouth as needed.      . Vitamins C E (CRANBERRY CONCENTRATE PO) Take 1 tablet by mouth 2 (two) times daily.       . hyaluronate sodium (RADIAPLEXRX) GEL Apply topically 2 (two) times daily.      . Nutritional Supplements (FIBER FORMULA) POWD Take 30 mLs by mouth daily. tkes 2-3 tablespoons daily for constipation       No current facility-administered medications for this visit.    SURGICAL HISTORY:  Past Surgical History  Procedure Laterality Date  . Nose surgery  1953  . Tonsillectomy  1955  . Nasal fracture surgery      as a child x 2  . Tonsillectomy    . Breast surgery  70's    right  . Appendectomy    . Bilateral cataract surgery    . Colonoscopy    . Neuroplasty / transposition median nerve at carpal tunnel bilateral    . Cardiac catheterization  20+yrs ago  . Breast lumpectomy with needle localization and axillary sentinel lymph node bx  02/07/2012    Procedure: BREAST LUMPECTOMY WITH NEEDLE LOCALIZATION AND AXILLARY SENTINEL LYMPH NODE BX;  Surgeon: Currie Paris, MD;  Location: [facility] OR;  Service: General;  Laterality: Right;  Right needle loc lumpectomy and Senitel Lymph Node Biopsy   . Abdominal hysterectomy      ovaries intact  . Portacath placement  03/26/2012    Procedure: INSERTION PORT-A-CATH;  Surgeon: Currie Paris, MD;  Location: [facility];  Service: General;  Laterality: Left;    REVIEW OF SYSTEMS:   General: fatigue (+), night sweats (-), fever (-), pain (-) Lymph: palpable nodes (-) HEENT: vision changes (-), mucositis (-), gum bleeding (-), epistaxis (-) Cardiovascular: chest pain (-), palpitations (-) Pulmonary: shortness of breath (-), dyspnea on exertion (-), cough (-), hemoptysis (-) GI:  Early satiety (-), melena (-), dysphagia (-), nausea/vomiting (-), diarrhea (-) GU: dysuria (-), hematuria (-), incontinence (-) Musculoskeletal: joint swelling (-), joint pain (-), back pain (-) Neuro: weakness (-), numbness (-), headache  (-), confusion (-) Skin: Rash (-), lesions (-), dryness (-) Psych: depression (-), suicidal/homicidal ideation (-), feeling of hopelessness (-)  PHYSICAL EXAMINATION: Blood pressure 148/64, pulse 83, temperature 97.7 F (36.5 C), temperature source Oral, resp. rate 20, height 5\' 5"  (1.651 m), weight 188 lb 1.6 oz (85.322 kg). Body mass index is 31.3 kg/(m^2). General: Patient is a well appearing female in no acute distress HEENT: PERRLA, sclerae anicteric no conjunctival pallor, MMM Neck: supple, right swollen lymph node in cervical chain, tender to palpation, approx .5 cm Lungs: clear to auscultation bilaterally, no wheezes, rhonchi, or rales Cardiovascular: regular rate rhythm, S1, S2, no murmurs, rubs or  gallops Abdomen: Soft, non-tender, non-distended, normoactive bowel sounds, no HSM Extremities: warm and well perfused, no clubbing, cyanosis, or edema Skin: No rashes or lesions Neuro: Non-focal Right breast lumpectomy site without nodularity ECOG PERFORMANCE STATUS: 0 - Asymptomatic      LABORATORY DATA: Lab Results  Component Value Date   WBC 5.8 09/07/2012   HGB 12.2 09/07/2012   HCT 37.0 09/07/2012   MCV 96.9 09/07/2012   PLT 229 09/07/2012      Chemistry      Component Value Date/Time   NA 140 09/07/2012 1137   NA 138 01/31/2012 1030   K 4.0 09/07/2012 1137   K 4.9 01/31/2012 1030   CL 102 09/07/2012 1137   CL 98 01/31/2012 1030   CO2 28 09/07/2012 1137   CO2 28 01/31/2012 1030   BUN 15.4 09/07/2012 1137   BUN 18 01/31/2012 1030   CREATININE 0.8 09/07/2012 1137   CREATININE 0.78 01/31/2012 1030      Component Value Date/Time   CALCIUM 9.5 09/07/2012 1137   CALCIUM 9.8 01/31/2012 1030   ALKPHOS 69 09/07/2012 1137   ALKPHOS 75 01/31/2012 1030   AST 20 09/07/2012 1137   AST 32 01/31/2012 1030   ALT 21 09/07/2012 1137   ALT 23 01/31/2012 1030   BILITOT 0.38 09/07/2012 1137   BILITOT 0.4 01/31/2012 1030       RADIOGRAPHIC STUDIES:  No results found.  ASSESSMENT:  77 year old female with  #1 stage II A. (T1 C. N1 a  cM0) invasive ductal carcinoma Measuring 1.8 cm ER positive PR positive HER-2/neu positive. One sentinel node was positive for metastatic disease.  #2 patient is a good candidate for adjuvant HER-2-based therapy. We discussed treatment with Taxotere carboplatinum and Herceptin. The Taxotere carboplatinum will be given every 3 weeks with day 2 Neulasta for a total of 6 cycles. The Herceptin will be given weekly for the duration of the chemotherapy and then changed to every 3 weeks to complete one year of Herceptin therapy.  #3 because patient has had a lumpectomy she will also require radiation therapy the patient has been seen by Dr. Ballard Russell regarding this we will refer her back to Dr. Dayton Scrape after she completes her chemotherapy.  PLAN:   #1Ms. Arsenau will proceed with Herceptin today.  She is doing well today.    #2 we will see her back in 3 weeks' time for next Herceptin.  #3 patient has been seen by Dr. Chipper Herb to begin radiation therapy. This will begin on 08/24/2012.  All questions were answered. The patient knows to call the clinic with any problems, questions or concerns. We can certainly see the patient much sooner if necessary.  I spent 25 minutes counseling the patient face to face. The total time spent in the appointment was 30 minutes.  Drue Second, MD Medical/Oncology [facility] [phone] (beeper) [phone] (Office)

## 2012-09-17 ENCOUNTER — Ambulatory Visit
Admission: RE | Admit: 2012-09-17 | Discharge: 2012-09-17 | Disposition: A | Discharge status: Home / Self Care | Payer: Medicare Other | Source: Ambulatory Visit | Attending: Radiation Oncology | Admitting: Radiation Oncology

## 2012-09-17 ENCOUNTER — Encounter: Payer: Self-pay | Admitting: Radiation Oncology

## 2012-09-17 VITALS — BP 162/85 | HR 77 | Temp 98.2°F | Resp 20 | Wt 187.7 lb

## 2012-09-17 DIAGNOSIS — C50911 Malignant neoplasm of unspecified site of right female breast: Secondary | ICD-10-CM

## 2012-09-17 NOTE — Progress Notes (Signed)
Weekly rad txs, right breast 18/28 completed slight erythema on breast and subclavian, skin intact,using radiaplex bid, no c/o pain,  12:11 PM

## 2012-09-17 NOTE — Progress Notes (Signed)
Weekly Management Note:  Site: Right breast/regional lymph nodes Current Dose:  3240 cGy  Projected Dose: 5040  cGy, no boost  Narrative: The patient is seen today for routine under treatment assessment. CBCT/MVCT images/port films were reviewed. The chart was reviewed.   She is without complaints today. She has Radioplex gel to use when necessary.  Physical Examination:  Filed Vitals:   09/17/12 1208  BP: 162/85  Pulse: 77  Temp: 98.2 F (36.8 C)  Resp: 20  .  Weight: 187 lb 11.2 oz (85.14 kg). There is erythema skin along the left breast and regional lymph nodes but no areas of desquamation.  Impression: Tolerating radiation therapy well.  Plan: Continue radiation therapy as planned.

## 2012-09-18 ENCOUNTER — Ambulatory Visit
Admission: RE | Admit: 2012-09-18 | Discharge: 2012-09-18 | Disposition: A | Discharge status: Home / Self Care | Payer: Medicare Other | Source: Ambulatory Visit | Attending: Radiation Oncology | Admitting: Radiation Oncology

## 2012-09-19 ENCOUNTER — Ambulatory Visit
Admission: RE | Admit: 2012-09-19 | Discharge: 2012-09-19 | Disposition: A | Discharge status: Home / Self Care | Payer: Medicare Other | Source: Ambulatory Visit | Attending: Radiation Oncology | Admitting: Radiation Oncology

## 2012-09-20 ENCOUNTER — Ambulatory Visit
Admission: RE | Admit: 2012-09-20 | Discharge: 2012-09-20 | Disposition: A | Discharge status: Home / Self Care | Payer: Medicare Other | Source: Ambulatory Visit | Attending: Radiation Oncology | Admitting: Radiation Oncology

## 2012-09-21 ENCOUNTER — Ambulatory Visit
Admission: RE | Admit: 2012-09-21 | Discharge: 2012-09-21 | Disposition: A | Discharge status: Home / Self Care | Payer: Medicare Other | Source: Ambulatory Visit | Attending: Radiation Oncology | Admitting: Radiation Oncology

## 2012-09-24 ENCOUNTER — Ambulatory Visit
Admission: RE | Admit: 2012-09-24 | Discharge: 2012-09-24 | Disposition: A | Discharge status: Home / Self Care | Payer: Medicare Other | Source: Ambulatory Visit | Attending: Radiation Oncology | Admitting: Radiation Oncology

## 2012-09-24 ENCOUNTER — Encounter: Payer: Self-pay | Admitting: Radiation Oncology

## 2012-09-24 VITALS — BP 152/77 | HR 81 | Temp 97.7°F | Resp 20 | Wt 189.9 lb

## 2012-09-24 DIAGNOSIS — C50911 Malignant neoplasm of unspecified site of right female breast: Secondary | ICD-10-CM

## 2012-09-24 NOTE — Progress Notes (Signed)
   Weekly Management Note:  outpatient Current Dose:  41.4 Gy  Projected Dose: 50.4 Gy   Narrative:  The patient presents for routine under treatment assessment.  CBCT/MVCT images/Port film x-rays were reviewed.  The chart was checked. Dry cough, no esophagitis  Physical Findings:  weight is 189 lb 14.4 oz (86.138 kg). Her oral temperature is 97.7 F (36.5 C). Her blood pressure is 152/77 and her pulse is 81. Her respiration is 20.  Erythematous R SCV and breast - skin intact  Impression:  The patient is tolerating radiotherapy.  Plan:  Continue radiotherapy as planned. Unlikely cough is related to RT /pneumonitis. Suggested lozenges for now. Continue radiaplex.  ________________________________   Lonie Peak, M.D.

## 2012-09-24 NOTE — Progress Notes (Signed)
Weekly rad txs right subclavian/breast,  23/28  Completed, no boost ight erythema, skin intact, tenderness, has dry cough, uses radiaplex tid 11:55 AM

## 2012-09-25 ENCOUNTER — Ambulatory Visit
Admission: RE | Admit: 2012-09-25 | Discharge: 2012-09-25 | Disposition: A | Discharge status: Home / Self Care | Payer: Medicare Other | Source: Ambulatory Visit | Attending: Radiation Oncology | Admitting: Radiation Oncology

## 2012-09-25 DIAGNOSIS — N3090 Cystitis, unspecified without hematuria: Secondary | ICD-10-CM | POA: Diagnosis not present

## 2012-09-25 DIAGNOSIS — C50919 Malignant neoplasm of unspecified site of unspecified female breast: Secondary | ICD-10-CM | POA: Diagnosis not present

## 2012-09-25 DIAGNOSIS — Z510 Encounter for antineoplastic radiation therapy: Secondary | ICD-10-CM | POA: Diagnosis present

## 2012-09-26 ENCOUNTER — Ambulatory Visit
Admission: RE | Admit: 2012-09-26 | Discharge: 2012-09-26 | Disposition: A | Discharge status: Home / Self Care | Payer: Medicare Other | Source: Ambulatory Visit | Attending: Radiation Oncology | Admitting: Radiation Oncology

## 2012-09-27 ENCOUNTER — Ambulatory Visit (HOSPITAL_BASED_OUTPATIENT_CLINIC_OR_DEPARTMENT_OTHER): Payer: Medicare Other

## 2012-09-27 ENCOUNTER — Other Ambulatory Visit (HOSPITAL_BASED_OUTPATIENT_CLINIC_OR_DEPARTMENT_OTHER): Payer: Medicare Other | Admitting: Lab

## 2012-09-27 ENCOUNTER — Ambulatory Visit
Admission: RE | Admit: 2012-09-27 | Discharge: 2012-09-27 | Disposition: A | Discharge status: Home / Self Care | Payer: Medicare Other | Source: Ambulatory Visit | Attending: Radiation Oncology | Admitting: Radiation Oncology

## 2012-09-27 ENCOUNTER — Ambulatory Visit (HOSPITAL_BASED_OUTPATIENT_CLINIC_OR_DEPARTMENT_OTHER): Payer: Medicare Other | Admitting: Adult Health

## 2012-09-27 ENCOUNTER — Encounter: Payer: Self-pay | Admitting: Adult Health

## 2012-09-27 VITALS — BP 152/78 | HR 77 | Temp 98.2°F | Resp 20 | Ht 65.0 in | Wt 188.9 lb

## 2012-09-27 DIAGNOSIS — C50419 Malignant neoplasm of upper-outer quadrant of unspecified female breast: Secondary | ICD-10-CM

## 2012-09-27 DIAGNOSIS — C773 Secondary and unspecified malignant neoplasm of axilla and upper limb lymph nodes: Secondary | ICD-10-CM

## 2012-09-27 DIAGNOSIS — C50911 Malignant neoplasm of unspecified site of right female breast: Secondary | ICD-10-CM

## 2012-09-27 DIAGNOSIS — R300 Dysuria: Secondary | ICD-10-CM

## 2012-09-27 DIAGNOSIS — Z5112 Encounter for antineoplastic immunotherapy: Secondary | ICD-10-CM

## 2012-09-27 DIAGNOSIS — R319 Hematuria, unspecified: Secondary | ICD-10-CM

## 2012-09-27 DIAGNOSIS — N390 Urinary tract infection, site not specified: Secondary | ICD-10-CM

## 2012-09-27 LAB — COMPREHENSIVE METABOLIC PANEL (CC13)
ALT: 21 U/L (ref 0–55)
AST: 18 U/L (ref 5–34)
Alkaline Phosphatase: 70 U/L (ref 40–150)
Potassium: 4.1 mEq/L (ref 3.5–5.1)
Sodium: 138 mEq/L (ref 136–145)
Total Bilirubin: 0.38 mg/dL (ref 0.20–1.20)
Total Protein: 6.7 g/dL (ref 6.4–8.3)

## 2012-09-27 LAB — CBC WITH DIFFERENTIAL/PLATELET
BASO%: 0.3 % (ref 0.0–2.0)
LYMPH%: 15 % (ref 14.0–49.7)
MCHC: 33.7 g/dL (ref 31.5–36.0)
MCV: 93.7 fL (ref 79.5–101.0)
MONO#: 0.5 10*3/uL (ref 0.1–0.9)
MONO%: 7.5 % (ref 0.0–14.0)
Platelets: 211 10*3/uL (ref 145–400)
RBC: 3.83 10*6/uL (ref 3.70–5.45)
RDW: 13.1 % (ref 11.2–14.5)
WBC: 6.9 10*3/uL (ref 3.9–10.3)

## 2012-09-27 LAB — URINALYSIS, MICROSCOPIC - CHCC
Nitrite: NEGATIVE
Protein: <30 mg/dL
Urobilinogen, UR: 0.2 mg/dL (ref 0.2–1)
pH: 6 (ref 4.6–8.0)

## 2012-09-27 MED ORDER — HEPARIN SOD (PORK) LOCK FLUSH 100 UNIT/ML IV SOLN
500.0000 [IU] | Freq: Once | INTRAVENOUS | Status: AC | PRN
  Administered 2012-09-27: 500 [IU]
  Filled 2012-09-27: qty 5

## 2012-09-27 MED ORDER — DIPHENHYDRAMINE HCL 25 MG PO CAPS
50.0000 mg | ORAL_CAPSULE | Freq: Once | ORAL | Status: AC
  Administered 2012-09-27: 25 mg via ORAL

## 2012-09-27 MED ORDER — TRASTUZUMAB CHEMO INJECTION 440 MG
6.0000 mg/kg | Freq: Once | INTRAVENOUS | Status: AC
  Administered 2012-09-27: 504 mg via INTRAVENOUS
  Filled 2012-09-27: qty 24

## 2012-09-27 MED ORDER — ACETAMINOPHEN 325 MG PO TABS
650.0000 mg | ORAL_TABLET | Freq: Once | ORAL | Status: AC
Start: 2012-09-27 — End: 2012-09-27
  Administered 2012-09-27: 650 mg via ORAL

## 2012-09-27 MED ORDER — SODIUM CHLORIDE 0.9 % IV SOLN
Freq: Once | INTRAVENOUS | Status: AC
  Administered 2012-09-27: 14:00:00 via INTRAVENOUS

## 2012-09-27 MED ORDER — CIPROFLOXACIN HCL 500 MG PO TABS: 500.0000 mg | ORAL_TABLET | Freq: Two times a day (BID) | ORAL | Status: DC

## 2012-09-27 MED ORDER — SODIUM CHLORIDE 0.9 % IJ SOLN
10.0000 mL | INTRAMUSCULAR | Status: DC | PRN
  Administered 2012-09-27: 10 mL
  Filled 2012-09-27: qty 10

## 2012-09-27 NOTE — Patient Instructions (Addendum)
Doing well.  Proceed with Herceptin.  Please call us if you have any questions or concerns.    

## 2012-09-27 NOTE — Progress Notes (Signed)
OFFICE PROGRESS NOTE  CC  [facility], MD [address] [place] [address] Dr. Cyndia Bent Dr. Chipper Herb  DIAGNOSIS: 77 year old female with new diagnosis of invasive ductal carcinoma that is ER positive PR positive HER-2/neu positive by [facility]  PRIOR THERAPY:  #1 patient was originally seenBy me on 02/21/2012 when she was diagnosed with invasive ductal carcinoma of the right breast stage II a with a positive lymph node. She underwent a lumpectomy and sentinel lymph node biopsy.The final pathology revealed a 1.8 cm invasive ductal carcinoma with low grade ductal carcinoma in situ the tumor was grade 2 ER positive PR positive Ki-67 was 11%. HER-2/neu was equivocal by cish.  #2 she had a sentinel lymph node biopsy one node was positive for metastatic disease. Making her a stage II A.  #3 patient and I and her family had an extensive discussion at the original meeting and we sent her tumor for HER-2/neu testing by [facility]. By [facility]  HER-2/neu is positive.  #4 patient is now going to be receiving combination chemotherapy consisting of Taxotere carboplatinum and Herceptin Beginning 03/30/2012 She will receive TCH combination every 21 days. She will receive day 2 Neulasta when she receives [facility]. On other weeks she will receive weekly Herceptin. A total of 6 cycles Of TCH is planned. Once she completes this she will then proceed with radiation therapy and we will plan on changing her Herceptin to every 3 weeks to complete out one year of therapy.  #5 Radiation therapy starting 08/23/12  #6 Every 3 week herceptin beginning 08/17/12.  CURRENT THERAPY: every 3 week Herceptin.   INTERVAL HISTORY: Heather Vega 77 y.o. female returns for followup visit today for her breast cancer.  She is doing well today.  She is here to receive adjuvant herceptin.  Her last echo was 07/16/12, EF was 60-65%.  She has an appointment with Dr. Gala Romney on 10/23/12.  She has dysuria and  blood tinged urine that started yesterday, she drank a lot of water and a cranberry pill and it is improving.  She also has a cough that started when she started radiation therapy.  She describes it as dry, non-productive, and deep.  Otherwise a 10 point ROS is negative.     MEDICAL HISTORY: Past Medical History  Diagnosis Date  . Hyperlipidemia     doesn't require meds  . Asthma     pt states its "not bad"  . Back pain     buldging disc and pt says 2 are "flat"  . Hemorrhoids   . History of colon polyps   . Diverticulosis   . Breast cancer     breast right  . Hard of hearing   . Diabetes mellitus without complication     takes Metformin daily,Type II  . Allergy   . Cataract     b/l surgery  . Hypothyroidism     hx of;but has been off of meds >24yrs ago  . Wears glasses   . Hypertension     history, was on b/p  meds years ago  . PONV (postoperative nausea and vomiting)   . History of chemotherapy 03/30/2012-07/20/2012    6 cycles of taxotere, carboplatinum, herceptin    ALLERGIES:  has No Known Allergies.  MEDICATIONS:  Current Outpatient Prescriptions  Medication Sig Dispense Refill  . aspirin 81 MG tablet Take 81 mg by mouth daily.      . B Complex-C (SUPER B COMPLEX PO) Take 1 capsule by  mouth daily.      . carvedilol (COREG) 6.25 MG tablet Take 1 tablet (6.25 mg total) by mouth 2 (two) times daily with a meal.  60 tablet  3  . CINNAMON PO Take 1 tablet by mouth 2 (two) times daily.       . hyaluronate sodium (RADIAPLEXRX) GEL Apply topically 2 (two) times daily.      Marland Kitchen lidocaine-prilocaine (EMLA) cream Apply topically as needed.  30 g  7  . LORazepam (ATIVAN) 0.5 MG tablet Take 1 tablet (0.5 mg total) by mouth every 6 (six) hours as needed (Nausea or vomiting).  30 tablet  1  . metFORMIN (GLUCOPHAGE) 1000 MG tablet Take 1,000 mg by mouth 2 (two) times daily with a meal.      . Multiple Vitamins-Minerals (MULTIVITAMIN WITH MINERALS) tablet Take 1 tablet by mouth daily.       . naproxen sodium (ANAPROX) 220 MG tablet Take 220 mg by mouth as needed.      . Nutritional Supplements (FIBER FORMULA) POWD Take 30 mLs by mouth daily. tkes 2-3 tablespoons daily for constipation      . Vitamins C E (CRANBERRY CONCENTRATE PO) Take 1 tablet by mouth 2 (two) times daily.        No current facility-administered medications for this visit.    SURGICAL HISTORY:  Past Surgical History  Procedure Laterality Date  . Nose surgery  1953  . Tonsillectomy  1955  . Nasal fracture surgery      as a child x 2  . Tonsillectomy    . Breast surgery  70's    right  . Appendectomy    . Bilateral cataract surgery    . Colonoscopy    . Neuroplasty / transposition median nerve at carpal tunnel bilateral    . Cardiac catheterization  20+yrs ago  . Breast lumpectomy with needle localization and axillary sentinel lymph node bx  02/07/2012    Procedure: BREAST LUMPECTOMY WITH NEEDLE LOCALIZATION AND AXILLARY SENTINEL LYMPH NODE BX;  Surgeon: Currie Paris, MD;  Location: [facility] OR;  Service: General;  Laterality: Right;  Right needle loc lumpectomy and Senitel Lymph Node Biopsy   . Abdominal hysterectomy      ovaries intact  . Portacath placement  03/26/2012    Procedure: INSERTION PORT-A-CATH;  Surgeon: Currie Paris, MD;  Location: [facility];  Service: General;  Laterality: Left;    REVIEW OF SYSTEMS:   General: fatigue (+), night sweats (-), fever (-), pain (-) Lymph: palpable nodes (-) HEENT: vision changes (-), mucositis (-), gum bleeding (-), epistaxis (-) Cardiovascular: chest pain (-), palpitations (-) Pulmonary: shortness of breath (-), dyspnea on exertion (-), cough (-), hemoptysis (-) GI:  Early satiety (-), melena (-), dysphagia (-), nausea/vomiting (-), diarrhea (-) GU: dysuria (-), hematuria (-), incontinence (-) Musculoskeletal: joint swelling (-), joint pain (-), back pain (-) Neuro: weakness (-), numbness (-), headache (-), confusion  (-) Skin: Rash (-), lesions (-), dryness (-) Psych: depression (-), suicidal/homicidal ideation (-), feeling of hopelessness (-)  PHYSICAL EXAMINATION: Blood pressure 152/78, pulse 77, temperature 98.2 F (36.8 C), temperature source Oral, resp. rate 20, height 5\' 5"  (1.651 m), weight 188 lb 14.4 oz (85.684 kg). Body mass index is 31.43 kg/(m^2). General: Patient is a well appearing female in no acute distress HEENT: PERRLA, sclerae anicteric no conjunctival pallor, MMM Neck: supple, no lymphadenopathy Lungs: clear to auscultation bilaterally, no wheezes, rhonchi, or rales Cardiovascular: regular rate rhythm, S1, S2, no  murmurs, rubs or gallops Abdomen: Soft, non-tender, non-distended, normoactive bowel sounds, no HSM Extremities: warm and well perfused, no clubbing, cyanosis, or edema Skin: No rashes or lesions Neuro: Non-focal Right breast lumpectomy site without nodularity ECOG PERFORMANCE STATUS: 0 - Asymptomatic      LABORATORY DATA: Lab Results  Component Value Date   WBC 6.9 09/27/2012   HGB 12.1 09/27/2012   HCT 35.9 09/27/2012   MCV 93.7 09/27/2012   PLT 211 09/27/2012      Chemistry      Component Value Date/Time   NA 140 09/07/2012 1137   NA 138 01/31/2012 1030   K 4.0 09/07/2012 1137   K 4.9 01/31/2012 1030   CL 102 09/07/2012 1137   CL 98 01/31/2012 1030   CO2 28 09/07/2012 1137   CO2 28 01/31/2012 1030   BUN 15.4 09/07/2012 1137   BUN 18 01/31/2012 1030   CREATININE 0.8 09/07/2012 1137   CREATININE 0.78 01/31/2012 1030      Component Value Date/Time   CALCIUM 9.5 09/07/2012 1137   CALCIUM 9.8 01/31/2012 1030   ALKPHOS 69 09/07/2012 1137   ALKPHOS 75 01/31/2012 1030   AST 20 09/07/2012 1137   AST 32 01/31/2012 1030   ALT 21 09/07/2012 1137   ALT 23 01/31/2012 1030   BILITOT 0.38 09/07/2012 1137   BILITOT 0.4 01/31/2012 1030       RADIOGRAPHIC STUDIES:  No results found.  ASSESSMENT: 77 year old female with  #1 stage II A. (T1 C. N1 a  cM0) invasive ductal carcinoma  Measuring 1.8 cm ER positive PR positive HER-2/neu positive. One sentinel node was positive for metastatic disease.  #2 patient was a good candidate for adjuvant HER-2-based therapy. Patient underwent treatment with Taxotere carboplatinum and Herceptin. The Taxotere carboplatinum will be given every 3 weeks with day 2 Neulasta for a total of 6 cycles. The Herceptin was be given weekly for the duration of the chemotherapy and then changed to every 3 weeks to complete one year of Herceptin therapy.  #3 Patient is currently undergoing radiation therapy under the care of Dr. Dayton Scrape.    PLAN:   #1Ms. Vega will proceed with Herceptin today.  She is doing well today.    #2 We will see her back in 3 weeks for her next treatment.    #3 We will send a urinalysis and urine culture along with going ahead and prescribing Cipro for the patient to take BID x 7 days.    #4 We will monitor the cough.  Should it worsen we will get a CXR.   All questions were answered. The patient knows to call the clinic with any problems, questions or concerns. We can certainly see the patient much sooner if necessary.  I spent 25 minutes counseling the patient face to face. The total time spent in the appointment was 30 minutes.  Cherie Ouch Lyn Hollingshead, NP Medical Oncology [facility] Phone: [phone]

## 2012-09-27 NOTE — Patient Instructions (Addendum)
[facility] Discharge Instructions for Patients Receiving Chemotherapy  Today you received the following chemotherapy agents: herceptin  To help prevent nausea and vomiting after your treatment, we encourage you to take your nausea medication.  Take it as often as prescribed.     If you develop nausea and vomiting that is not controlled by your nausea medication, call the clinic. If it is after clinic hours your family physician or the after hours number for the clinic or go to the Emergency Department.   BELOW ARE SYMPTOMS THAT SHOULD BE REPORTED IMMEDIATELY:  *FEVER GREATER THAN 100.5 F  *CHILLS WITH OR WITHOUT FEVER  NAUSEA AND VOMITING THAT IS NOT CONTROLLED WITH YOUR NAUSEA MEDICATION  *UNUSUAL SHORTNESS OF BREATH  *UNUSUAL BRUISING OR BLEEDING  TENDERNESS IN MOUTH AND THROAT WITH OR WITHOUT PRESENCE OF ULCERS  *URINARY PROBLEMS  *BOWEL PROBLEMS  UNUSUAL RASH Items with * indicate a potential emergency and should be followed up as soon as possible.  Feel free to call the clinic you have any questions or concerns. The clinic phone number is [phone].   I have been informed and understand all the instructions given to me. I know to contact the clinic, my physician, or go to the Emergency Department if any problems should occur. I do not have any questions at this time, but understand that I may call the clinic during office hours   should I have any questions or need assistance in obtaining follow up care.    __________________________________________  _____________  __________ Signature of Patient or Authorized Representative            Date                   Time    __________________________________________ Nurse's Signature    

## 2012-10-01 ENCOUNTER — Ambulatory Visit
Admission: RE | Admit: 2012-10-01 | Discharge: 2012-10-01 | Disposition: A | Discharge status: Home / Self Care | Payer: Medicare Other | Source: Ambulatory Visit | Attending: Radiation Oncology | Admitting: Radiation Oncology

## 2012-10-01 VITALS — BP 142/80 | HR 79 | Temp 97.7°F | Ht 65.0 in | Wt 188.8 lb

## 2012-10-01 DIAGNOSIS — C50911 Malignant neoplasm of unspecified site of right female breast: Secondary | ICD-10-CM

## 2012-10-01 NOTE — Progress Notes (Signed)
Weekly Management Note:  Site: Right breast/regional lymph nodes Current Dose:  4860  cGy Projected Dose: 5040  cGy  Narrative: The patient is seen today for routine under treatment assessment. CBCT/MVCT images/port films were reviewed. The chart was reviewed.   She is having more right nipple discomfort and takes Aleve when necessary. She uses Radioplex gel 3 times a day. She is currently on Cipro for a bladder infection.  Physical Examination:  Filed Vitals:   10/01/12 1140  BP: 142/80  Pulse: 79  Temp: 97.7 F (36.5 C)  .  Weight: 188 lb 12.8 oz (85.639 kg). There is moderate erythema along the right breast and weekly region with no areas of desquamation.  Impression: Tolerating radiation therapy well. She will continue with Aleve for her right nipple discomfort.  Plan: Continue radiation therapy as planned. She'll finish her radiation therapy tomorrow and return here for a one-month followup visit.

## 2012-10-01 NOTE — Progress Notes (Signed)
Heather Vega Shown here for weekly under treat visit.  She has had 27 fractions to her right breast.  She does have pain that she is rating at a 6/10 in her right nipple.  The skin on her right breast is red.  She is using radiaplex gel 3 times a day. She does have a little fatigue.  She also is reporting a cough and occasionally brings up green sputum.  She had a bladder infection last week and is on cipro.

## 2012-10-02 ENCOUNTER — Encounter: Payer: Self-pay | Admitting: Radiation Oncology

## 2012-10-02 ENCOUNTER — Ambulatory Visit
Admission: RE | Admit: 2012-10-02 | Discharge: 2012-10-02 | Disposition: A | Discharge status: Home / Self Care | Payer: Medicare Other | Source: Ambulatory Visit | Attending: Radiation Oncology | Admitting: Radiation Oncology

## 2012-10-02 NOTE — Progress Notes (Signed)
[facility] Radiation Oncology End of Treatment Note  Name:Sarely R Heinle  Date: 10/02/2012 ZOX:[id number] DOB:07-Dec-1935   Status:outpatient    CC: WJXBJ,YNWGN, MD  Dr. Cicero Duck  REFERRING PHYSICIAN:   Dr. Cicero Duck   DIAGNOSIS: Stage IIA (T1, N1, M0) invasive ductal/DCIS of the right breast    INDICATION FOR TREATMENT: Curative   TREATMENT DATES: 08/23/2012 through 10/02/2012                          SITE/DOSE:  Right breast and regional lymph nodes 5040 cGy 28 sessions (no breast boost)                          BEAMS/ENERGY:   Mixed 10 MV/18 MV photons tangential fields to the right breast , 10 MV photons right supraclavicular axillary region, LAO, dose prescribed at 3 cm depth, and 6 MV photons PA right axillary field to bring the midaxillary dose up to 4600 cGy in 28 sessions              NARRATIVE:  She tolerated treatment well with the expected degree radiation dermatitis. She had no areas of moist desquamation by completion of therapy. She used Radioplex gel during her course of treatment.                          PLAN: Routine followup in one month. Patient instructed to call if questions or worsening complaints in interim.

## 2012-10-03 ENCOUNTER — Ambulatory Visit: Payer: Medicare Other

## 2012-10-03 ENCOUNTER — Encounter (INDEPENDENT_AMBULATORY_CARE_PROVIDER_SITE_OTHER): Payer: Self-pay | Admitting: Surgery

## 2012-10-04 ENCOUNTER — Ambulatory Visit: Payer: Medicare Other

## 2012-10-05 ENCOUNTER — Ambulatory Visit: Payer: Medicare Other

## 2012-10-08 ENCOUNTER — Ambulatory Visit: Payer: Medicare Other

## 2012-10-09 ENCOUNTER — Ambulatory Visit: Payer: Medicare Other

## 2012-10-18 ENCOUNTER — Telehealth: Payer: Self-pay | Admitting: *Deleted

## 2012-10-18 ENCOUNTER — Ambulatory Visit (HOSPITAL_BASED_OUTPATIENT_CLINIC_OR_DEPARTMENT_OTHER): Payer: Medicare Other

## 2012-10-18 ENCOUNTER — Encounter: Payer: Self-pay | Admitting: Adult Health

## 2012-10-18 ENCOUNTER — Ambulatory Visit (HOSPITAL_BASED_OUTPATIENT_CLINIC_OR_DEPARTMENT_OTHER): Payer: Medicare Other | Admitting: Adult Health

## 2012-10-18 ENCOUNTER — Other Ambulatory Visit (HOSPITAL_BASED_OUTPATIENT_CLINIC_OR_DEPARTMENT_OTHER): Payer: Medicare Other | Admitting: Lab

## 2012-10-18 VITALS — BP 143/91 | HR 89 | Temp 98.2°F | Resp 20 | Ht 65.0 in | Wt 184.8 lb

## 2012-10-18 DIAGNOSIS — C50911 Malignant neoplasm of unspecified site of right female breast: Secondary | ICD-10-CM

## 2012-10-18 DIAGNOSIS — C50419 Malignant neoplasm of upper-outer quadrant of unspecified female breast: Secondary | ICD-10-CM

## 2012-10-18 DIAGNOSIS — Z5112 Encounter for antineoplastic immunotherapy: Secondary | ICD-10-CM

## 2012-10-18 DIAGNOSIS — Z780 Asymptomatic menopausal state: Secondary | ICD-10-CM

## 2012-10-18 DIAGNOSIS — C773 Secondary and unspecified malignant neoplasm of axilla and upper limb lymph nodes: Secondary | ICD-10-CM

## 2012-10-18 DIAGNOSIS — Z170 Estrogen receptor positive status [ER+]: Secondary | ICD-10-CM

## 2012-10-18 DIAGNOSIS — Z79811 Long term (current) use of aromatase inhibitors: Secondary | ICD-10-CM

## 2012-10-18 LAB — CBC WITH DIFFERENTIAL/PLATELET
BASO%: 0.4 % (ref 0.0–2.0)
EOS%: 5.3 % (ref 0.0–7.0)
HCT: 37.3 % (ref 34.8–46.6)
MCH: 31 pg (ref 25.1–34.0)
MCHC: 33.8 g/dL (ref 31.5–36.0)
MONO#: 0.4 10*3/uL (ref 0.1–0.9)
RBC: 4.07 10*6/uL (ref 3.70–5.45)
RDW: 13.2 % (ref 11.2–14.5)
WBC: 4.6 10*3/uL (ref 3.9–10.3)
lymph#: 0.9 10*3/uL (ref 0.9–3.3)

## 2012-10-18 LAB — COMPREHENSIVE METABOLIC PANEL (CC13)
ALT: 26 U/L (ref 0–55)
AST: 21 U/L (ref 5–34)
CO2: 26 mEq/L (ref 22–29)
Calcium: 9.5 mg/dL (ref 8.4–10.4)
Chloride: 103 mEq/L (ref 98–109)
Creatinine: 0.8 mg/dL (ref 0.6–1.1)
Potassium: 4.1 mEq/L (ref 3.5–5.1)
Sodium: 140 mEq/L (ref 136–145)
Total Protein: 6.8 g/dL (ref 6.4–8.3)

## 2012-10-18 MED ORDER — HEPARIN SOD (PORK) LOCK FLUSH 100 UNIT/ML IV SOLN
500.0000 [IU] | Freq: Once | INTRAVENOUS | Status: AC | PRN
  Administered 2012-10-18: 500 [IU]
  Filled 2012-10-18: qty 5

## 2012-10-18 MED ORDER — LETROZOLE 2.5 MG PO TABS: 2.5000 mg | ORAL_TABLET | Freq: Every day | ORAL | Status: DC

## 2012-10-18 MED ORDER — ACETAMINOPHEN 325 MG PO TABS
650.0000 mg | ORAL_TABLET | Freq: Once | ORAL | Status: AC
  Administered 2012-10-18: 650 mg via ORAL

## 2012-10-18 MED ORDER — SODIUM CHLORIDE 0.9 % IJ SOLN
10.0000 mL | INTRAMUSCULAR | Status: DC | PRN
  Administered 2012-10-18: 10 mL
  Filled 2012-10-18: qty 10

## 2012-10-18 MED ORDER — SODIUM CHLORIDE 0.9 % IV SOLN
6.0000 mg/kg | Freq: Once | INTRAVENOUS | Status: AC
  Administered 2012-10-18: 504 mg via INTRAVENOUS
  Filled 2012-10-18: qty 24

## 2012-10-18 MED ORDER — SODIUM CHLORIDE 0.9 % IV SOLN: Freq: Once | INTRAVENOUS | Status: DC

## 2012-10-18 NOTE — Telephone Encounter (Signed)
appts made and printed. Pt is aware that i emailed MW to add tx for 9/4 and 9/25...td

## 2012-10-18 NOTE — Telephone Encounter (Signed)
Per staff message and POF I have scheduled appts.  JMW  

## 2012-10-18 NOTE — Patient Instructions (Addendum)
Doing well.  Proceed with Herceptin.  Please call us if you have any questions or concerns.    Letrozole tablets What is this medicine? LETROZOLE (LET roe zole) blocks the production of estrogen. Certain types of breast cancer grow under the influence of estrogen. Letrozole helps block tumor growth. This medicine is used to treat advanced breast cancer in postmenopausal women. This medicine may be used for other purposes; ask your health care provider or pharmacist if you have questions. What should I tell my health care provider before I take this medicine? They need to know if you have any of these conditions: -liver disease -osteoporosis (weak bones) -an unusual or allergic reaction to letrozole, other medicines, foods, dyes, or preservatives -pregnant or trying to get pregnant -breast-feeding How should I use this medicine? Take this medicine by mouth with a glass of water. You may take it with or without food. Follow the directions on the prescription label. Take your medicine at regular intervals. Do not take your medicine more often than directed. Do not stop taking except on your doctor's advice. Talk to your pediatrician regarding the use of this medicine in children. Special care may be needed. Overdosage: If you think you have taken too much of this medicine contact a poison control center or emergency room at once. NOTE: This medicine is only for you. Do not share this medicine with others. What if I miss a dose? If you miss a dose, take it as soon as you can. If it is almost time for your next dose, take only that dose. Do not take double or extra doses. What may interact with this medicine? Do not take this medicine with any of the following medications: -estrogens, like hormone replacement therapy or birth control pills This medicine may also interact with the following medications: -dietary supplements such as androstenedione or DHEA -prasterone -tamoxifen This list may not  describe all possible interactions. Give your health care provider a list of all the medicines, herbs, non-prescription drugs, or dietary supplements you use. Also tell them if you smoke, drink alcohol, or use illegal drugs. Some items may interact with your medicine. What should I watch for while using this medicine? Visit your doctor or health care professional for regular check-ups to monitor your condition. Do not use this drug if you are pregnant. Serious side effects to an unborn child are possible. Talk to your doctor or pharmacist for more information. You may get drowsy or dizzy. Do not drive, use machinery, or do anything that needs mental alertness until you know how this medicine affects you. Do not stand or sit up quickly, especially if you are an older patient. This reduces the risk of dizzy or fainting spells. What side effects may I notice from receiving this medicine? Side effects that you should report to your doctor or health care professional as soon as possible: -allergic reactions like skin rash, itching, or hives -bone fracture -chest pain -difficulty breathing or shortness of breath -severe pain, swelling, warmth in the leg -unusually weak or tired -vaginal bleeding Side effects that usually do not require medical attention (report to your doctor or health care professional if they continue or are bothersome): -bone, back, joint, or muscle pain -dizziness -fatigue -fluid retention -headache -hot flashes, night sweats -nausea -weight gain This list may not describe all possible side effects. Call your doctor for medical advice about side effects. You may report side effects to FDA at 1-800-FDA-1088. Where should I keep my medicine? Keep  out of the reach of children. Store between 15 and 30 degrees C (59 and 86 degrees F). Throw away any unused medicine after the expiration date. NOTE: This sheet is a summary. It may not cover all possible information. If you have  questions about this medicine, talk to your doctor, pharmacist, or health care provider.  2012, Elsevier/Gold Standard. (05/25/2007 4:43:44 PM)

## 2012-10-18 NOTE — Patient Instructions (Addendum)
Trastuzumab injection for infusion What is this medicine? TRASTUZUMAB (tras TOO zoo mab) is a monoclonal antibody. It targets a protein called HER2. This protein is found in some stomach and breast cancers. This medicine can stop cancer cell growth. This medicine may be used with other cancer treatments. This medicine may be used for other purposes; ask your health care provider or pharmacist if you have questions. What should I tell my health care provider before I take this medicine? They need to know if you have any of these conditions: -heart disease -heart failure -infection (especially a virus infection such as chickenpox, cold sores, or herpes) -lung or breathing disease, like asthma -recent or ongoing radiation therapy -an unusual or allergic reaction to trastuzumab, benzyl alcohol, or other medications, foods, dyes, or preservatives -pregnant or trying to get pregnant -breast-feeding How should I use this medicine? This drug is given as an infusion into a vein. It is administered in a hospital or clinic by a specially trained health care professional. Talk to your pediatrician regarding the use of this medicine in children. This medicine is not approved for use in children. Overdosage: If you think you have taken too much of this medicine contact a poison control center or emergency room at once. NOTE: This medicine is only for you. Do not share this medicine with others. What if I miss a dose? It is important not to miss a dose. Call your doctor or health care professional if you are unable to keep an appointment. What may interact with this medicine? -cyclophosphamide -doxorubicin -warfarin This list may not describe all possible interactions. Give your health care provider a list of all the medicines, herbs, non-prescription drugs, or dietary supplements you use. Also tell them if you smoke, drink alcohol, or use illegal drugs. Some items may interact with your medicine. What  should I watch for while using this medicine? Visit your doctor for checks on your progress. Report any side effects. Continue your course of treatment even though you feel ill unless your doctor tells you to stop. Call your doctor or health care professional for advice if you get a fever, chills or sore throat, or other symptoms of a cold or flu. Do not treat yourself. Try to avoid being around people who are sick. You may experience fever, chills and shaking during your first infusion. These effects are usually mild and can be treated with other medicines. Report any side effects during the infusion to your health care professional. Fever and chills usually do not happen with later infusions. What side effects may I notice from receiving this medicine? Side effects that you should report to your doctor or other health care professional as soon as possible: -breathing difficulties -chest pain or palpitations -cough -dizziness or fainting -fever or chills, sore throat -skin rash, itching or hives -swelling of the legs or ankles -unusually weak or tired Side effects that usually do not require medical attention (report to your doctor or other health care professional if they continue or are bothersome): -loss of appetite -headache -muscle aches -nausea This list may not describe all possible side effects. Call your doctor for medical advice about side effects. You may report side effects to FDA at 1-800-FDA-1088. Where should I keep my medicine? This drug is given in a hospital or clinic and will not be stored at home. NOTE: This sheet is a summary. It may not cover all possible information. If you have questions about this medicine, talk to your doctor, pharmacist,   or health care provider.  2013, Elsevier/Gold Standard. (01/16/2009 1:43:15 PM)  [facility] Discharge Instructions for Patients Receiving Chemotherapy  Today you received the following chemotherapy agent:  Herceptin  If you develop nausea and vomiting, call the clinic.   BELOW ARE SYMPTOMS THAT SHOULD BE REPORTED IMMEDIATELY:  *FEVER GREATER THAN 100.5 F  *CHILLS WITH OR WITHOUT FEVER  NAUSEA AND VOMITING THAT IS NOT CONTROLLED WITH YOUR NAUSEA MEDICATION  *UNUSUAL SHORTNESS OF BREATH  *UNUSUAL BRUISING OR BLEEDING  TENDERNESS IN MOUTH AND THROAT WITH OR WITHOUT PRESENCE OF ULCERS  *URINARY PROBLEMS  *BOWEL PROBLEMS  UNUSUAL RASH Items with * indicate a potential emergency and should be followed up as soon as possible.  Feel free to call the clinic you have any questions or concerns. The clinic phone number is [phone].

## 2012-10-18 NOTE — Progress Notes (Signed)
OFFICE PROGRESS NOTE  CC  [facility], MD [address] [facility] [place] [address] Dr. Cyndia Bent Dr. Chipper Herb  DIAGNOSIS: 77 year old female with new diagnosis of invasive ductal carcinoma that is ER positive PR positive HER-2/neu positive by her[facility] Testing  PRIOR THERAPY:  #1 patient was originally seenBy me on 02/21/2012 when she was diagnosed with invasive ductal carcinoma of the right breast stage II a with a positive lymph node. She underwent a lumpectomy and sentinel lymph node biopsy.The final pathology revealed a 1.8 cm invasive ductal carcinoma with low grade ductal carcinoma in situ the tumor was grade 2 ER positive PR positive Ki-67 was 11%. HER-2/neu was equivocal by cish.  #2 she had a sentinel lymph node biopsy one node was positive for metastatic disease. Making her a stage II A.  #3 patient and I and her family had an extensive discussion at the original meeting and we sent her tumor for HER-2/neu testing by [facility]. By her[facility]  HER-2/neu is positive.  #4 patient is now going to be receiving combination chemotherapy consisting of Taxotere carboplatinum and Herceptin Beginning 03/30/2012 She will receive TCH combination every 21 days. She will receive day 2 Neulasta when she receives [facility]. On other weeks she will receive weekly Herceptin. A total of 6 cycles Of TCH is planned. Once she completes this she will then proceed with radiation therapy and we will plan on changing her Herceptin to every 3 weeks to complete out one year of therapy.  #5 Radiation therapy starting 08/23/12  #6 Every 3 week herceptin beginning 08/17/12.  CURRENT THERAPY: every 3 week Herceptin.   INTERVAL HISTORY: Heather Vega 77 y.o. female returns for followup visit today for her breast cancer.  She is doing well today.  She is here to receive adjuvant herceptin.  Her last echo was 07/16/12, EF was 60-65%.  She has an appointment with Dr. Gala Romney on  10/23/12.  She is feeling well today.  A 10 point ROS is negative.    MEDICAL HISTORY: Past Medical History  Diagnosis Date  . Hyperlipidemia     doesn't require meds  . Asthma     pt states its "not bad"  . Back pain     buldging disc and pt says 2 are "flat"  . Hemorrhoids   . History of colon polyps   . Diverticulosis   . Breast cancer     breast right  . Hard of hearing   . Diabetes mellitus without complication     takes Metformin daily,Type II  . Allergy   . Cataract     b/l surgery  . Hypothyroidism     hx of;but has been off of meds >4yrs ago  . Wears glasses   . Hypertension     history, was on b/p  meds years ago  . PONV (postoperative nausea and vomiting)   . History of chemotherapy 03/30/2012-07/20/2012    6 cycles of taxotere, carboplatinum, herceptin    ALLERGIES:  has No Known Allergies.  MEDICATIONS:  Current Outpatient Prescriptions  Medication Sig Dispense Refill  . aspirin 81 MG tablet Take 81 mg by mouth daily.      . B Complex-C (SUPER B COMPLEX PO) Take 1 capsule by mouth daily.      . carvedilol (COREG) 6.25 MG tablet Take 1 tablet (6.25 mg total) by mouth 2 (two) times daily with a meal.  60 tablet  3  . CINNAMON PO Take  1 tablet by mouth 2 (two) times daily.       . ciprofloxacin (CIPRO) 500 MG tablet Take 1 tablet (500 mg total) by mouth 2 (two) times daily.  14 tablet  0  . hyaluronate sodium (RADIAPLEXRX) GEL Apply topically 2 (two) times daily.      Marland Kitchen lidocaine-prilocaine (EMLA) cream Apply topically as needed.  30 g  7  . LORazepam (ATIVAN) 0.5 MG tablet Take 1 tablet (0.5 mg total) by mouth every 6 (six) hours as needed (Nausea or vomiting).  30 tablet  1  . metFORMIN (GLUCOPHAGE) 1000 MG tablet Take 1,000 mg by mouth 2 (two) times daily with a meal.      . Multiple Vitamins-Minerals (MULTIVITAMIN WITH MINERALS) tablet Take 1 tablet by mouth daily.      . naproxen sodium (ANAPROX) 220 MG tablet Take 220 mg by mouth as needed.      .  Nutritional Supplements (FIBER FORMULA) POWD Take 30 mLs by mouth daily. tkes 2-3 tablespoons daily for constipation      . Vitamins C E (CRANBERRY CONCENTRATE PO) Take 1 tablet by mouth 2 (two) times daily.        No current facility-administered medications for this visit.    SURGICAL HISTORY:  Past Surgical History  Procedure Laterality Date  . Nose surgery  1953  . Tonsillectomy  1955  . Nasal fracture surgery      as a child x 2  . Tonsillectomy    . Breast surgery  70's    right  . Appendectomy    . Bilateral cataract surgery    . Colonoscopy    . Neuroplasty / transposition median nerve at carpal tunnel bilateral    . Cardiac catheterization  20+yrs ago  . Breast lumpectomy with needle localization and axillary sentinel lymph node bx  02/07/2012    Procedure: BREAST LUMPECTOMY WITH NEEDLE LOCALIZATION AND AXILLARY SENTINEL LYMPH NODE BX;  Surgeon: Currie Paris, MD;  Location: [facility] OR;  Service: General;  Laterality: Right;  Right needle loc lumpectomy and Senitel Lymph Node Biopsy   . Abdominal hysterectomy      ovaries intact  . Portacath placement  03/26/2012    Procedure: INSERTION PORT-A-CATH;  Surgeon: Currie Paris, MD;  Location: [facility];  Service: General;  Laterality: Left;    REVIEW OF SYSTEMS:   General: fatigue (+), night sweats (-), fever (-), pain (-) Lymph: palpable nodes (-) HEENT: vision changes (-), mucositis (-), gum bleeding (-), epistaxis (-) Cardiovascular: chest pain (-), palpitations (-) Pulmonary: shortness of breath (-), dyspnea on exertion (-), cough (-), hemoptysis (-) GI:  Early satiety (-), melena (-), dysphagia (-), nausea/vomiting (-), diarrhea (-) GU: dysuria (-), hematuria (-), incontinence (-) Musculoskeletal: joint swelling (-), joint pain (-), back pain (-) Neuro: weakness (-), numbness (-), headache (-), confusion (-) Skin: Rash (-), lesions (-), dryness (-) Psych: depression (-), suicidal/homicidal  ideation (-), feeling of hopelessness (-)  PHYSICAL EXAMINATION: Blood pressure 143/91, pulse 89, temperature 98.2 F (36.8 C), temperature source Oral, resp. rate 20, height 5\' 5"  (1.651 m), weight 184 lb 12.8 oz (83.825 kg). Body mass index is 30.75 kg/(m^2). General: Patient is a well appearing female in no acute distress HEENT: PERRLA, sclerae anicteric no conjunctival pallor, MMM Neck: supple, no lymphadenopathy Lungs: clear to auscultation bilaterally, no wheezes, rhonchi, or rales Cardiovascular: regular rate rhythm, S1, S2, no murmurs, rubs or gallops Abdomen: Soft, non-tender, non-distended, normoactive bowel sounds, no HSM Extremities:  warm and well perfused, no clubbing, cyanosis, or edema Skin: No rashes or lesions Neuro: Non-focal Right breast lumpectomy site without nodularity ECOG PERFORMANCE STATUS: 0 - Asymptomatic      LABORATORY DATA: Lab Results  Component Value Date   WBC 4.6 10/18/2012   HGB 12.6 10/18/2012   HCT 37.3 10/18/2012   MCV 91.6 10/18/2012   PLT 212 10/18/2012      Chemistry      Component Value Date/Time   NA 138 09/27/2012 1158   NA 138 01/31/2012 1030   K 4.1 09/27/2012 1158   K 4.9 01/31/2012 1030   CL 102 09/07/2012 1137   CL 98 01/31/2012 1030   CO2 29 09/27/2012 1158   CO2 28 01/31/2012 1030   BUN 14.4 09/27/2012 1158   BUN 18 01/31/2012 1030   CREATININE 0.7 09/27/2012 1158   CREATININE 0.78 01/31/2012 1030      Component Value Date/Time   CALCIUM 9.5 09/27/2012 1158   CALCIUM 9.8 01/31/2012 1030   ALKPHOS 70 09/27/2012 1158   ALKPHOS 75 01/31/2012 1030   AST 18 09/27/2012 1158   AST 32 01/31/2012 1030   ALT 21 09/27/2012 1158   ALT 23 01/31/2012 1030   BILITOT 0.38 09/27/2012 1158   BILITOT 0.4 01/31/2012 1030       RADIOGRAPHIC STUDIES:  No results found.  ASSESSMENT: 77 year old female with  #1 stage II A. (T1 C. N1 a  cM0) invasive ductal carcinoma Measuring 1.8 cm ER positive PR positive HER-2/neu positive. One sentinel node was positive for  metastatic disease.  #2 patient was a good candidate for adjuvant HER-2-based therapy. Patient underwent treatment with Taxotere carboplatinum and Herceptin. The Taxotere carboplatinum will be given every 3 weeks with day 2 Neulasta for a total of 6 cycles. The Herceptin was be given weekly for the duration of the chemotherapy and then changed to every 3 weeks to complete one year of Herceptin therapy.  #3 Patient is currently undergoing radiation therapy under the care of Dr. Dayton Scrape.    PLAN:   #1Ms. Vega will proceed with Herceptin today.  She is doing well today.  I started Letrozole therapy and ordered a bone density test to be done.  I gave her the details of the medication in her AVS and reviewed it with her in its entirety.    #2 We will see her back in 3 weeks for her next treatment.    All questions were answered. The patient knows to call the clinic with any problems, questions or concerns. We can certainly see the patient much sooner if necessary.  I spent 25 minutes counseling the patient face to face. The total time spent in the appointment was 30 minutes.  Cherie Ouch Lyn Hollingshead, NP Medical Oncology [facility] Phone: [phone]

## 2012-10-23 ENCOUNTER — Ambulatory Visit (HOSPITAL_COMMUNITY)
Admission: RE | Admit: 2012-10-23 | Discharge: 2012-10-23 | Disposition: A | Discharge status: Home / Self Care | Payer: Medicare Other | Source: Ambulatory Visit | Attending: Internal Medicine | Admitting: Internal Medicine

## 2012-10-23 ENCOUNTER — Ambulatory Visit (HOSPITAL_BASED_OUTPATIENT_CLINIC_OR_DEPARTMENT_OTHER)
Admission: RE | Admit: 2012-10-23 | Discharge: 2012-10-23 | Disposition: A | Discharge status: Home / Self Care | Payer: Medicare Other | Source: Ambulatory Visit | Attending: Internal Medicine | Admitting: Internal Medicine

## 2012-10-23 ENCOUNTER — Other Ambulatory Visit (HOSPITAL_COMMUNITY): Payer: Self-pay | Admitting: Vascular Surgery

## 2012-10-23 ENCOUNTER — Other Ambulatory Visit (HOSPITAL_COMMUNITY): Payer: Self-pay | Admitting: Internal Medicine

## 2012-10-23 ENCOUNTER — Encounter (HOSPITAL_COMMUNITY): Payer: Self-pay

## 2012-10-23 VITALS — BP 130/82 | HR 66 | Wt 186.0 lb

## 2012-10-23 DIAGNOSIS — C50911 Malignant neoplasm of unspecified site of right female breast: Secondary | ICD-10-CM

## 2012-10-23 DIAGNOSIS — C50919 Malignant neoplasm of unspecified site of unspecified female breast: Secondary | ICD-10-CM

## 2012-10-23 DIAGNOSIS — Z09 Encounter for follow-up examination after completed treatment for conditions other than malignant neoplasm: Secondary | ICD-10-CM

## 2012-10-23 DIAGNOSIS — I10 Essential (primary) hypertension: Secondary | ICD-10-CM

## 2012-10-23 DIAGNOSIS — I080 Rheumatic disorders of both mitral and aortic valves: Secondary | ICD-10-CM | POA: Insufficient documentation

## 2012-10-23 NOTE — Progress Notes (Signed)
Patient ID: KHADEEJAH CASTNER, female   DOB: 1935-10-25, 77 y.o.   MRN: [id number] Oncologist: Dr Welton Flakes General Surgeon: Dr Jamey Ripa Radiation Oncologist: Dr Dayton Scrape PCP: Dr Sharlot Gowda  HPI: Ms. Deviney is a 77 year old female with new diagnosis of invasive ductal carcinoma that is ER positive PR positive HER-2/neu positive, hypothyroid (not on medicaitons), DM II, and HOH.   02/21/2012 when she was diagnosed with invasive ductal carcinoma of the right breast stage II a with a positive lymph node. She underwent a lumpectomy and sentinel lymph node biopsy.The final pathology revealed a 1.8 cm invasive ductal carcinoma with low grade ductal carcinoma in situ the tumor was grade 2 ER positive PR positive Ki-67 was 11%. HER-2/neu was equivocal by cish. Plan for Taxotere carboplatinum and Herceptin. Taxotere carboplatinum will be given every 3 weeks with day 2 Neulasta for a total of 6 cycles. The Herceptin will be given weekly for the duration of the chemotherapy and then changed to every 3 weeks to complete one year of Herceptin therapy.   Denies any h/o heart disease. Had cath in 1996 in [place], [place]. 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 Clean cors. Normal EF. Says she has white coat HTN  ECHO  03/29/12 EF 60-65% lateral S' 8.3 severe LVH 07/16/12 EF 60% lat s' 9.7 grade 1 diastolic dysfunction  10/23/12 EF 55-60% lateral s' 9.4 global strain -16.6  She returns for follow up. Last visit carvedilol was increased to 6.25 mg twice a day.  Denies SOB/PND/Orthopnea. She continues on Herceptin every 3 weeks.    Review of Systems: All pertinent positives and negatives as in HPI, otherwise negative    Past Medical History  Diagnosis Date  . Hyperlipidemia     doesn't require meds  . Asthma     pt states its "not bad"  . Back pain     buldging disc and pt says 2 are "flat"  . Hemorrhoids   . History of colon polyps   . Diverticulosis   . Breast cancer     breast right  . Hard of  hearing   . Diabetes mellitus without complication     takes Metformin daily,Type II  . Allergy   . Cataract     b/l surgery  . Hypothyroidism     hx of;but has been off of meds >37yrs ago  . Wears glasses   . Hypertension     history, was on b/p  meds years ago  . PONV (postoperative nausea and vomiting)   . History of chemotherapy 03/30/2012-07/20/2012    6 cycles of taxotere, carboplatinum, herceptin    Current Outpatient Prescriptions  Medication Sig Dispense Refill  . aspirin 81 MG tablet Take 81 mg by mouth daily.      . B Complex-C (SUPER B COMPLEX PO) Take 1 capsule by mouth daily.      . carvedilol (COREG) 6.25 MG tablet Take 1 tablet (6.25 mg total) by mouth 2 (two) times daily with a meal.  60 tablet  3  . CINNAMON PO Take 1 tablet by mouth 2 (two) times daily.       Marland Kitchen letrozole (FEMARA) 2.5 MG tablet Take 1 tablet (2.5 mg total) by mouth daily.  30 tablet  3  . lidocaine-prilocaine (EMLA) cream Apply topically as needed.  30 g  7  . LORazepam (ATIVAN) 0.5 MG tablet Take 1 tablet (0.5 mg total) by mouth every 6 (six) hours as needed (Nausea or vomiting).  30 tablet  1  . metFORMIN (GLUCOPHAGE) 1000 MG tablet Take 1,000 mg by mouth 2 (two) times daily with a meal.      . Multiple Vitamins-Minerals (MULTIVITAMIN WITH MINERALS) tablet Take 1 tablet by mouth daily.      . naproxen sodium (ANAPROX) 220 MG tablet Take 220 mg by mouth as needed.      . Vitamins C E (CRANBERRY CONCENTRATE PO) Take 1 tablet by mouth 2 (two) times daily.        No current facility-administered medications for this encounter.     No Known Allergies  PHYSICAL EXAM: Filed Vitals:   10/23/12 1056  BP: 130/82  Pulse: 66   General:  elderly. No respiratory difficulty HEENT: normal Neck: supple. no JVD. Carotids 2+ bilat; no bruits. No lymphadenopathy or thryomegaly appreciated. Cor: PMI nondisplaced. Regular rate & rhythm. No rubs, gallops or murmurs. Lungs: clear Abdomen: soft, nontender,  nondistended. No hepatosplenomegaly. No bruits or masses. Good bowel sounds. Extremities: no cyanosis, clubbing, rash, tr edema Neuro: alert & oriented x 3, cranial nerves grossly intact except for reduced hearing. moves all 4 extremities w/o difficulty. Affect pleasant,  ASSESSMENT & PLAN: 1) Breast CA  2) HTN  I reviewed echos personally and compared to previous. Lateral s' prime difficult to evaluate but appears stable. EF and global strain look great. No HF on exam. Continue Herceptin. BP better on carvedilol. F/u echo in 3 months.  Daniel Bensimhon,MD 1:40 PM

## 2012-10-23 NOTE — Patient Instructions (Addendum)
The current medical regimen is effective;  continue present plan and medications.  Your physician has requested that you have an echocardiogram. Echocardiography is a painless test that uses sound waves to create images of your heart. It provides your doctor with information about the size and shape of your heart and how well your heart's chambers and valves are working. This procedure takes approximately one hour. There are no restrictions for this procedure.  Follow up in 3 months with Dr Gala Romney.

## 2012-10-23 NOTE — Progress Notes (Signed)
Echocardiogram 2D Echocardiogram has been performed.  Heather Vega 10/23/2012, 1:48 PM

## 2012-10-30 ENCOUNTER — Ambulatory Visit
Admission: RE | Admit: 2012-10-30 | Discharge: 2012-10-30 | Disposition: A | Discharge status: Home / Self Care | Payer: Medicare Other | Source: Ambulatory Visit | Attending: Adult Health | Admitting: Adult Health

## 2012-10-30 DIAGNOSIS — C50911 Malignant neoplasm of unspecified site of right female breast: Secondary | ICD-10-CM

## 2012-10-30 DIAGNOSIS — Z79811 Long term (current) use of aromatase inhibitors: Secondary | ICD-10-CM

## 2012-10-30 DIAGNOSIS — Z780 Asymptomatic menopausal state: Secondary | ICD-10-CM

## 2012-10-31 ENCOUNTER — Other Ambulatory Visit: Payer: Self-pay

## 2012-11-08 ENCOUNTER — Ambulatory Visit (HOSPITAL_BASED_OUTPATIENT_CLINIC_OR_DEPARTMENT_OTHER): Payer: Medicare Other

## 2012-11-08 ENCOUNTER — Ambulatory Visit (HOSPITAL_BASED_OUTPATIENT_CLINIC_OR_DEPARTMENT_OTHER): Payer: Medicare Other | Admitting: Adult Health

## 2012-11-08 ENCOUNTER — Encounter: Payer: Self-pay | Admitting: Adult Health

## 2012-11-08 ENCOUNTER — Other Ambulatory Visit (HOSPITAL_BASED_OUTPATIENT_CLINIC_OR_DEPARTMENT_OTHER): Payer: Medicare Other | Admitting: Lab

## 2012-11-08 ENCOUNTER — Telehealth: Payer: Self-pay | Admitting: Oncology

## 2012-11-08 VITALS — BP 171/90 | HR 80 | Temp 98.8°F | Resp 20 | Ht 65.0 in | Wt 184.4 lb

## 2012-11-08 DIAGNOSIS — C50911 Malignant neoplasm of unspecified site of right female breast: Secondary | ICD-10-CM

## 2012-11-08 DIAGNOSIS — C50419 Malignant neoplasm of upper-outer quadrant of unspecified female breast: Secondary | ICD-10-CM

## 2012-11-08 DIAGNOSIS — C773 Secondary and unspecified malignant neoplasm of axilla and upper limb lymph nodes: Secondary | ICD-10-CM

## 2012-11-08 DIAGNOSIS — R52 Pain, unspecified: Secondary | ICD-10-CM

## 2012-11-08 DIAGNOSIS — R5381 Other malaise: Secondary | ICD-10-CM

## 2012-11-08 DIAGNOSIS — Z5112 Encounter for antineoplastic immunotherapy: Secondary | ICD-10-CM

## 2012-11-08 DIAGNOSIS — Z170 Estrogen receptor positive status [ER+]: Secondary | ICD-10-CM

## 2012-11-08 LAB — CBC WITH DIFFERENTIAL/PLATELET
Basophils Absolute: 0 10*3/uL (ref 0.0–0.1)
Eosinophils Absolute: 0.4 10*3/uL (ref 0.0–0.5)
HGB: 12.8 g/dL (ref 11.6–15.9)
MCV: 89.3 fL (ref 79.5–101.0)
MONO#: 0.5 10*3/uL (ref 0.1–0.9)
NEUT#: 4.1 10*3/uL (ref 1.5–6.5)
RBC: 4.22 10*6/uL (ref 3.70–5.45)
RDW: 13.5 % (ref 11.2–14.5)
WBC: 6 10*3/uL (ref 3.9–10.3)
lymph#: 1 10*3/uL (ref 0.9–3.3)

## 2012-11-08 LAB — COMPREHENSIVE METABOLIC PANEL (CC13)
Albumin: 3.7 g/dL (ref 3.5–5.0)
BUN: 18.4 mg/dL (ref 7.0–26.0)
CO2: 24 mEq/L (ref 22–29)
Calcium: 9.5 mg/dL (ref 8.4–10.4)
Chloride: 105 mEq/L (ref 98–109)
Glucose: 100 mg/dl (ref 70–140)
Potassium: 4.4 mEq/L (ref 3.5–5.1)
Sodium: 142 mEq/L (ref 136–145)
Total Protein: 6.8 g/dL (ref 6.4–8.3)

## 2012-11-08 MED ORDER — SODIUM CHLORIDE 0.9 % IV SOLN
6.0000 mg/kg | Freq: Once | INTRAVENOUS | Status: AC
  Administered 2012-11-08: 504 mg via INTRAVENOUS
  Filled 2012-11-08: qty 24

## 2012-11-08 MED ORDER — SODIUM CHLORIDE 0.9 % IJ SOLN
10.0000 mL | INTRAMUSCULAR | Status: DC | PRN
  Administered 2012-11-08: 10 mL
  Filled 2012-11-08: qty 10

## 2012-11-08 MED ORDER — SODIUM CHLORIDE 0.9 % IV SOLN
Freq: Once | INTRAVENOUS | Status: AC
  Administered 2012-11-08: 13:00:00 via INTRAVENOUS

## 2012-11-08 MED ORDER — HEPARIN SOD (PORK) LOCK FLUSH 100 UNIT/ML IV SOLN
500.0000 [IU] | Freq: Once | INTRAVENOUS | Status: AC | PRN
  Administered 2012-11-08: 500 [IU]
  Filled 2012-11-08: qty 5

## 2012-11-08 MED ORDER — ACETAMINOPHEN 325 MG PO TABS
650.0000 mg | ORAL_TABLET | Freq: Once | ORAL | Status: AC
  Administered 2012-11-08: 650 mg via ORAL

## 2012-11-08 NOTE — Progress Notes (Signed)
OFFICE PROGRESS NOTE  CC  [facility], MD [address] [facility] [place] [address] Dr. Cyndia Bent Dr. Chipper Herb  DIAGNOSIS: 77 year old female with new diagnosis of invasive ductal carcinoma that is ER positive PR positive HER-2/neu positive by [facility] Testing  PRIOR THERAPY:  #1 patient was originally seenBy me on 02/21/2012 when she was diagnosed with invasive ductal carcinoma of the right breast stage II a with a positive lymph node. She underwent a lumpectomy and sentinel lymph node biopsy.The final pathology revealed a 1.8 cm invasive ductal carcinoma with low grade ductal carcinoma in situ the tumor was grade 2 ER positive PR positive Ki-67 was 11%. HER-2/neu was equivocal by cish.  #2 she had a sentinel lymph node biopsy one node was positive for metastatic disease. Making her a stage II A.  #3 patient and I and her family had an extensive discussion at the original meeting and we sent her tumor for HER-2/neu testing by [facility]. By [facility]mark  HER-2/neu is positive.  #4 patient is now going to be receiving combination chemotherapy consisting of Taxotere carboplatinum and Herceptin Beginning 03/30/2012 She will receive TCH combination every 21 days. She will receive day 2 Neulasta when she receives [facility]. On other weeks she will receive weekly Herceptin. A total of 6 cycles Of TCH is planned. Once she completes this she will then proceed with radiation therapy and we will plan on changing her Herceptin to every 3 weeks to complete out one year of therapy.  #5 Radiation therapy starting 08/23/12 through 10/02/12.    #6 Every 3 week herceptin beginning 08/17/12.  Letrozole therapy started in July 2014.    CURRENT THERAPY: every 3 week Herceptin.   INTERVAL HISTORY: Marvin Maenza Neaves 77 y.o. female returns for followup visit today for her breast cancer.  She is doing well today.  She is here to receive adjuvant herceptin.  Her last echo was 10/23/12.   She was cleared by Dr. Gala Romney to continue Herceptin therapy.  She could not tolerate the Letrozole.  She is having severe joint aches and pains and fatigue.  She wants to know if there is another medication she can take instead of the Letrozole.  Otherwise, a 10 point ROs is neg.   MEDICAL HISTORY: Past Medical History  Diagnosis Date  . Hyperlipidemia     doesn't require meds  . Asthma     pt states its "not bad"  . Back pain     buldging disc and pt says 2 are "flat"  . Hemorrhoids   . History of colon polyps   . Diverticulosis   . Breast cancer     breast right  . Hard of hearing   . Diabetes mellitus without complication     takes Metformin daily,Type II  . Allergy   . Cataract     b/l surgery  . Hypothyroidism     hx of;but has been off of meds >79yrs ago  . Wears glasses   . Hypertension     history, was on b/p  meds years ago  . PONV (postoperative nausea and vomiting)   . History of chemotherapy 03/30/2012-07/20/2012    6 cycles of taxotere, carboplatinum, herceptin    ALLERGIES:  has No Known Allergies.  MEDICATIONS:  Current Outpatient Prescriptions  Medication Sig Dispense Refill  . aspirin 81 MG tablet Take 81 mg by mouth daily.      . B Complex-C (SUPER B COMPLEX PO) Take 1  capsule by mouth daily.      . carvedilol (COREG) 6.25 MG tablet Take 1 tablet (6.25 mg total) by mouth 2 (two) times daily with a meal.  60 tablet  3  . CINNAMON PO Take 1 tablet by mouth 2 (two) times daily.       Marland Kitchen lidocaine-prilocaine (EMLA) cream Apply topically as needed.  30 g  7  . LORazepam (ATIVAN) 0.5 MG tablet Take 1 tablet (0.5 mg total) by mouth every 6 (six) hours as needed (Nausea or vomiting).  30 tablet  1  . metFORMIN (GLUCOPHAGE) 1000 MG tablet Take 1,000 mg by mouth 2 (two) times daily with a meal.      . Multiple Vitamins-Minerals (MULTIVITAMIN WITH MINERALS) tablet Take 1 tablet by mouth daily.      . naproxen sodium (ANAPROX) 220 MG tablet Take 220 mg by mouth as  needed.      . Vitamins C E (CRANBERRY CONCENTRATE PO) Take 1 tablet by mouth 2 (two) times daily.       Marland Kitchen letrozole (FEMARA) 2.5 MG tablet Take 1 tablet (2.5 mg total) by mouth daily.  30 tablet  3   No current facility-administered medications for this visit.    SURGICAL HISTORY:  Past Surgical History  Procedure Laterality Date  . Nose surgery  1953  . Tonsillectomy  1955  . Nasal fracture surgery      as a child x 2  . Tonsillectomy    . Breast surgery  70's    right  . Appendectomy    . Bilateral cataract surgery    . Colonoscopy    . Neuroplasty / transposition median nerve at carpal tunnel bilateral    . Cardiac catheterization  20+yrs ago  . Breast lumpectomy with needle localization and axillary sentinel lymph node bx  02/07/2012    Procedure: BREAST LUMPECTOMY WITH NEEDLE LOCALIZATION AND AXILLARY SENTINEL LYMPH NODE BX;  Surgeon: Currie Paris, MD;  Location: [facility] OR;  Service: General;  Laterality: Right;  Right needle loc lumpectomy and Senitel Lymph Node Biopsy   . Abdominal hysterectomy      ovaries intact  . Portacath placement  03/26/2012    Procedure: INSERTION PORT-A-CATH;  Surgeon: Currie Paris, MD;  Location: [facility];  Service: General;  Laterality: Left;    REVIEW OF SYSTEMS:   General: fatigue (+), night sweats (-), fever (-), pain (-) Lymph: palpable nodes (-) HEENT: vision changes (-), mucositis (-), gum bleeding (-), epistaxis (-) Cardiovascular: chest pain (-), palpitations (-) Pulmonary: shortness of breath (-), dyspnea on exertion (-), cough (-), hemoptysis (-) GI:  Early satiety (-), melena (-), dysphagia (-), nausea/vomiting (-), diarrhea (-) GU: dysuria (-), hematuria (-), incontinence (-) Musculoskeletal: joint swelling (-), joint pain (-), back pain (-) Neuro: weakness (-), numbness (-), headache (-), confusion (-) Skin: Rash (-), lesions (-), dryness (-) Psych: depression (-), suicidal/homicidal ideation (-),  feeling of hopelessness (-)  PHYSICAL EXAMINATION: Blood pressure 171/90, pulse 80, temperature 98.8 F (37.1 C), temperature source Oral, resp. rate 20, height 5\' 5"  (1.651 m), weight 184 lb 6.4 oz (83.643 kg). Body mass index is 30.69 kg/(m^2). General: Patient is a well appearing female in no acute distress HEENT: PERRLA, sclerae anicteric no conjunctival pallor, MMM Neck: supple, no lymphadenopathy Lungs: clear to auscultation bilaterally, no wheezes, rhonchi, or rales Cardiovascular: regular rate rhythm, S1, S2, no murmurs, rubs or gallops Abdomen: Soft, non-tender, non-distended, normoactive bowel sounds, no HSM Extremities: warm and  well perfused, no clubbing, cyanosis, or edema Skin: No rashes or lesions Neuro: Non-focal Right breast lumpectomy site without nodularity ECOG PERFORMANCE STATUS: 0 - Asymptomatic      LABORATORY DATA: Lab Results  Component Value Date   WBC 6.0 11/08/2012   HGB 12.8 11/08/2012   HCT 37.7 11/08/2012   MCV 89.3 11/08/2012   PLT 216 11/08/2012      Chemistry      Component Value Date/Time   NA 142 11/08/2012 1106   NA 138 01/31/2012 1030   K 4.4 11/08/2012 1106   K 4.9 01/31/2012 1030   CL 102 09/07/2012 1137   CL 98 01/31/2012 1030   CO2 24 11/08/2012 1106   CO2 28 01/31/2012 1030   BUN 18.4 11/08/2012 1106   BUN 18 01/31/2012 1030   CREATININE 0.8 11/08/2012 1106   CREATININE 0.78 01/31/2012 1030      Component Value Date/Time   CALCIUM 9.5 11/08/2012 1106   CALCIUM 9.8 01/31/2012 1030   ALKPHOS 75 11/08/2012 1106   ALKPHOS 75 01/31/2012 1030   AST 23 11/08/2012 1106   AST 32 01/31/2012 1030   ALT 24 11/08/2012 1106   ALT 23 01/31/2012 1030   BILITOT 0.40 11/08/2012 1106   BILITOT 0.4 01/31/2012 1030       RADIOGRAPHIC STUDIES:  No results found.  ASSESSMENT: 77 year old female with  #1 stage II A. (T1 C. N1 a  cM0) invasive ductal carcinoma Measuring 1.8 cm ER positive PR positive HER-2/neu positive. One sentinel node was positive for  metastatic disease.  #2 patient was a good candidate for adjuvant HER-2-based therapy. Patient underwent treatment with Taxotere carboplatinum and Herceptin. The Taxotere carboplatinum will be given every 3 weeks with day 2 Neulasta for a total of 6 cycles. The Herceptin was be given weekly for the duration of the chemotherapy and then changed to every 3 weeks to complete one year of Herceptin therapy.  #3 Patient has completed radiation therapy with Dr. Dayton Scrape and was started on Letrozole afterward in 09/2012.  She unfortunately was unable to tolerate the Letrozole.    PLAN:   #1Ms. Arsenau will proceed with Herceptin today.  She is doing well today. Due to the difficulties with Letrozole she will stop the therapy and we will re-evaluate at her next appointment.    #2 We will see her back in 3 weeks for her next treatment.    All questions were answered. The patient knows to call the clinic with any problems, questions or concerns. We can certainly see the patient much sooner if necessary.  I spent 25 minutes counseling the patient face to face. The total time spent in the appointment was 30 minutes.  Cherie Ouch Lyn Hollingshead, NP Medical Oncology [facility] Phone: [phone]

## 2012-11-08 NOTE — Patient Instructions (Addendum)
[facility] Discharge Instructions for Patients Receiving Chemotherapy  Today you received the following chemotherapy agents Herceptin.  To help prevent nausea and vomiting after your treatment, we encourage you to take your nausea medication as prescribed.   If you develop nausea and vomiting that is not controlled by your nausea medication, call the clinic.   BELOW ARE SYMPTOMS THAT SHOULD BE REPORTED IMMEDIATELY:  *FEVER GREATER THAN 100.5 F  *CHILLS WITH OR WITHOUT FEVER  NAUSEA AND VOMITING THAT IS NOT CONTROLLED WITH YOUR NAUSEA MEDICATION  *UNUSUAL SHORTNESS OF BREATH  *UNUSUAL BRUISING OR BLEEDING  TENDERNESS IN MOUTH AND THROAT WITH OR WITHOUT PRESENCE OF ULCERS  *URINARY PROBLEMS  *BOWEL PROBLEMS  UNUSUAL RASH Items with * indicate a potential emergency and should be followed up as soon as possible.  Feel free to call the clinic you have any questions or concerns. The clinic phone number is [phone].    

## 2012-11-08 NOTE — Patient Instructions (Signed)
Doing well.  Proceed with Herceptin.  Please call us if you have any questions or concerns.    

## 2012-11-09 ENCOUNTER — Telehealth: Payer: Self-pay | Admitting: *Deleted

## 2012-11-09 NOTE — Telephone Encounter (Signed)
Per staff phone call and POF I have schedueld appts.  JMW  

## 2012-11-29 ENCOUNTER — Other Ambulatory Visit (HOSPITAL_BASED_OUTPATIENT_CLINIC_OR_DEPARTMENT_OTHER): Payer: Medicare Other | Admitting: Lab

## 2012-11-29 ENCOUNTER — Encounter: Payer: Self-pay | Admitting: Adult Health

## 2012-11-29 ENCOUNTER — Ambulatory Visit (HOSPITAL_BASED_OUTPATIENT_CLINIC_OR_DEPARTMENT_OTHER): Payer: Medicare Other

## 2012-11-29 ENCOUNTER — Ambulatory Visit (HOSPITAL_BASED_OUTPATIENT_CLINIC_OR_DEPARTMENT_OTHER): Payer: Medicare Other | Admitting: Adult Health

## 2012-11-29 VITALS — BP 143/79 | HR 80 | Temp 97.9°F | Resp 20 | Ht 65.0 in | Wt 182.6 lb

## 2012-11-29 DIAGNOSIS — C50911 Malignant neoplasm of unspecified site of right female breast: Secondary | ICD-10-CM

## 2012-11-29 DIAGNOSIS — C773 Secondary and unspecified malignant neoplasm of axilla and upper limb lymph nodes: Secondary | ICD-10-CM

## 2012-11-29 DIAGNOSIS — C50419 Malignant neoplasm of upper-outer quadrant of unspecified female breast: Secondary | ICD-10-CM

## 2012-11-29 DIAGNOSIS — Z5112 Encounter for antineoplastic immunotherapy: Secondary | ICD-10-CM

## 2012-11-29 LAB — COMPREHENSIVE METABOLIC PANEL (CC13)
ALT: 29 U/L (ref 0–55)
Albumin: 3.9 g/dL (ref 3.5–5.0)
Alkaline Phosphatase: 74 U/L (ref 40–150)
Glucose: 95 mg/dl (ref 70–140)
Potassium: 4.3 mEq/L (ref 3.5–5.1)
Sodium: 142 mEq/L (ref 136–145)
Total Bilirubin: 0.53 mg/dL (ref 0.20–1.20)
Total Protein: 6.9 g/dL (ref 6.4–8.3)

## 2012-11-29 LAB — CBC WITH DIFFERENTIAL/PLATELET
Basophils Absolute: 0 10*3/uL (ref 0.0–0.1)
EOS%: 4.7 % (ref 0.0–7.0)
Eosinophils Absolute: 0.3 10*3/uL (ref 0.0–0.5)
HGB: 12.9 g/dL (ref 11.6–15.9)
MCV: 87.4 fL (ref 79.5–101.0)
MONO%: 7.1 % (ref 0.0–14.0)
NEUT#: 3.7 10*3/uL (ref 1.5–6.5)
RBC: 4.28 10*6/uL (ref 3.70–5.45)
RDW: 13.7 % (ref 11.2–14.5)
lymph#: 1 10*3/uL (ref 0.9–3.3)
nRBC: 0 % (ref 0–0)

## 2012-11-29 MED ORDER — DIPHENHYDRAMINE HCL 25 MG PO CAPS: 50.0000 mg | ORAL_CAPSULE | Freq: Once | ORAL | Status: DC

## 2012-11-29 MED ORDER — SODIUM CHLORIDE 0.9 % IJ SOLN
10.0000 mL | INTRAMUSCULAR | Status: DC | PRN
  Administered 2012-11-29: 10 mL
  Filled 2012-11-29: qty 10

## 2012-11-29 MED ORDER — TRASTUZUMAB CHEMO INJECTION 440 MG
6.0000 mg/kg | Freq: Once | INTRAVENOUS | Status: AC
  Administered 2012-11-29: 504 mg via INTRAVENOUS
  Filled 2012-11-29: qty 24

## 2012-11-29 MED ORDER — ACETAMINOPHEN 325 MG PO TABS
650.0000 mg | ORAL_TABLET | Freq: Once | ORAL | Status: AC
  Administered 2012-11-29: 650 mg via ORAL

## 2012-11-29 MED ORDER — SODIUM CHLORIDE 0.9 % IV SOLN
Freq: Once | INTRAVENOUS | Status: AC
  Administered 2012-11-29: 13:00:00 via INTRAVENOUS

## 2012-11-29 MED ORDER — ANASTROZOLE 1 MG PO TABS: 1.0000 mg | ORAL_TABLET | Freq: Every day | ORAL | Status: DC

## 2012-11-29 MED ORDER — HEPARIN SOD (PORK) LOCK FLUSH 100 UNIT/ML IV SOLN
500.0000 [IU] | Freq: Once | INTRAVENOUS | Status: AC | PRN
  Administered 2012-11-29: 500 [IU]
  Filled 2012-11-29: qty 5

## 2012-11-29 NOTE — Patient Instructions (Signed)
[facility] Discharge Instructions for Patients Receiving Chemotherapy  Today you received the following chemotherapy/antibody agent: Herceptin  Please call office if you develop any nausea or vomiting.   BELOW ARE SYMPTOMS THAT SHOULD BE REPORTED IMMEDIATELY:  *FEVER GREATER THAN 100.5 F  *CHILLS WITH OR WITHOUT FEVER  NAUSEA AND VOMITING THAT IS NOT CONTROLLED WITH YOUR NAUSEA MEDICATION  *UNUSUAL SHORTNESS OF BREATH  *UNUSUAL BRUISING OR BLEEDING  TENDERNESS IN MOUTH AND THROAT WITH OR WITHOUT PRESENCE OF ULCERS  *URINARY PROBLEMS  *BOWEL PROBLEMS  UNUSUAL RASH Items with * indicate a potential emergency and should be followed up as soon as possible.  Feel free to call the clinic you have any questions or concerns. The clinic phone number is [phone].  It has been a pleasure to serve you today!

## 2012-11-29 NOTE — Patient Instructions (Signed)

## 2012-11-29 NOTE — Progress Notes (Signed)
OFFICE PROGRESS NOTE  CC  [facility], MD [address] [facility] [place] [address] Dr. Cyndia Bent Dr. Chipper Herb  DIAGNOSIS: 77 year old female with new diagnosis of invasive ductal carcinoma that is ER positive PR positive HER-2/neu positive by her[facility] Testing  PRIOR THERAPY:  #1 patient was originally seenBy me on 02/21/2012 when she was diagnosed with invasive ductal carcinoma of the right breast stage II a with a positive lymph node. She underwent a lumpectomy and sentinel lymph node biopsy.The final pathology revealed a 1.8 cm invasive ductal carcinoma with low grade ductal carcinoma in situ the tumor was grade 2 ER positive PR positive Ki-67 was 11%. HER-2/neu was equivocal by cish.  #2 she had a sentinel lymph node biopsy one node was positive for metastatic disease. Making her a stage II A.  #3 patient and I and her family had an extensive discussion at the original meeting and we sent her tumor for HER-2/neu testing by [facility]. By hermark  HER-2/neu is positive.  #4 patient is now going to be receiving combination chemotherapy consisting of Taxotere carboplatinum and Herceptin Beginning 03/30/2012 She will receive TCH combination every 21 days. She will receive day 2 Neulasta when she receives [facility]. On other weeks she will receive weekly Herceptin. A total of 6 cycles Of TCH is planned. Once she completes this she will then proceed with radiation therapy and we will plan on changing her Herceptin to every 3 weeks to complete out one year of therapy.  #5 Radiation therapy starting 08/23/12 through 10/02/12.    #6 Every 3 week herceptin beginning 08/17/12.  Letrozole therapy started in July 2014.    CURRENT THERAPY: every 3 week Herceptin.   INTERVAL HISTORY: Heather Vega 77 y.o. female returns for followup visit today for her breast cancer.   She's feeling much improved since stopping the Letrozole.  Her son is with her today and they  have questions about the aromatase inhibitors in addition to Herceptin, and scans following Herceptin and chemotherapy completion.  Otherwise, a 10 point ROS is neg.   MEDICAL HISTORY: Past Medical History  Diagnosis Date  . Hyperlipidemia     doesn't require meds  . Asthma     pt states its "not bad"  . Back pain     buldging disc and pt says 2 are "flat"  . Hemorrhoids   . History of colon polyps   . Diverticulosis   . Breast cancer     breast right  . Hard of hearing   . Diabetes mellitus without complication     takes Metformin daily,Type II  . Allergy   . Cataract     b/l surgery  . Hypothyroidism     hx of;but has been off of meds >53yrs ago  . Wears glasses   . Hypertension     history, was on b/p  meds years ago  . PONV (postoperative nausea and vomiting)   . History of chemotherapy 03/30/2012-07/20/2012    6 cycles of taxotere, carboplatinum, herceptin    ALLERGIES:  has No Known Allergies.  MEDICATIONS:  Current Outpatient Prescriptions  Medication Sig Dispense Refill  . aspirin 81 MG tablet Take 81 mg by mouth daily.      . B Complex-C (SUPER B COMPLEX PO) Take 1 capsule by mouth daily.      . carvedilol (COREG) 6.25 MG tablet Take 1 tablet (6.25 mg total) by mouth 2 (two) times daily with a meal.  60 tablet  3  . CINNAMON PO Take 1 tablet by mouth 2 (two) times daily.       Marland Kitchen letrozole (FEMARA) 2.5 MG tablet Take 1 tablet (2.5 mg total) by mouth daily.  30 tablet  3  . lidocaine-prilocaine (EMLA) cream Apply topically as needed.  30 g  7  . LORazepam (ATIVAN) 0.5 MG tablet Take 1 tablet (0.5 mg total) by mouth every 6 (six) hours as needed (Nausea or vomiting).  30 tablet  1  . metFORMIN (GLUCOPHAGE) 1000 MG tablet Take 1,000 mg by mouth 2 (two) times daily with a meal.      . Multiple Vitamins-Minerals (MULTIVITAMIN WITH MINERALS) tablet Take 1 tablet by mouth daily.      . naproxen sodium (ANAPROX) 220 MG tablet Take 220 mg by mouth as needed.      . Vitamins  C E (CRANBERRY CONCENTRATE PO) Take 1 tablet by mouth 2 (two) times daily.       Marland Kitchen anastrozole (ARIMIDEX) 1 MG tablet Take 1 tablet (1 mg total) by mouth daily.  30 tablet  3   No current facility-administered medications for this visit.    SURGICAL HISTORY:  Past Surgical History  Procedure Laterality Date  . Nose surgery  1953  . Tonsillectomy  1955  . Nasal fracture surgery      as a child x 2  . Tonsillectomy    . Breast surgery  70's    right  . Appendectomy    . Bilateral cataract surgery    . Colonoscopy    . Neuroplasty / transposition median nerve at carpal tunnel bilateral    . Cardiac catheterization  20+yrs ago  . Breast lumpectomy with needle localization and axillary sentinel lymph node bx  02/07/2012    Procedure: BREAST LUMPECTOMY WITH NEEDLE LOCALIZATION AND AXILLARY SENTINEL LYMPH NODE BX;  Surgeon: Currie Paris, MD;  Location: [facility] OR;  Service: General;  Laterality: Right;  Right needle loc lumpectomy and Senitel Lymph Node Biopsy   . Abdominal hysterectomy      ovaries intact  . Portacath placement  03/26/2012    Procedure: INSERTION PORT-A-CATH;  Surgeon: Currie Paris, MD;  Location: [facility];  Service: General;  Laterality: Left;    REVIEW OF SYSTEMS:   General: fatigue (+), night sweats (-), fever (-), pain (-) Lymph: palpable nodes (-) HEENT: vision changes (-), mucositis (-), gum bleeding (-), epistaxis (-) Cardiovascular: chest pain (-), palpitations (-) Pulmonary: shortness of breath (-), dyspnea on exertion (-), cough (-), hemoptysis (-) GI:  Early satiety (-), melena (-), dysphagia (-), nausea/vomiting (-), diarrhea (-) GU: dysuria (-), hematuria (-), incontinence (-) Musculoskeletal: joint swelling (-), joint pain (-), back pain (-) Neuro: weakness (-), numbness (-), headache (-), confusion (-) Skin: Rash (-), lesions (-), dryness (-) Psych: depression (-), suicidal/homicidal ideation (-), feeling of hopelessness  (-)  PHYSICAL EXAMINATION: Blood pressure 143/79, pulse 80, temperature 97.9 F (36.6 C), temperature source Oral, resp. rate 20, height 5\' 5"  (1.651 m), weight 182 lb 9.6 oz (82.827 kg). Body mass index is 30.39 kg/(m^2). General: Patient is a well appearing female in no acute distress HEENT: PERRLA, sclerae anicteric no conjunctival pallor, MMM Neck: supple, no lymphadenopathy Lungs: clear to auscultation bilaterally, no wheezes, rhonchi, or rales Cardiovascular: regular rate rhythm, S1, S2, no murmurs, rubs or gallops Abdomen: Soft, non-tender, non-distended, normoactive bowel sounds, no HSM Extremities: warm and well perfused, no clubbing, cyanosis, or edema Skin: No rashes  or lesions Neuro: Non-focal Right breast lumpectomy site without nodularity ECOG PERFORMANCE STATUS: 0 - Asymptomatic      LABORATORY DATA: Lab Results  Component Value Date   WBC 5.4 11/29/2012   HGB 12.9 11/29/2012   HCT 37.4 11/29/2012   MCV 87.4 11/29/2012   PLT 233 11/29/2012      Chemistry      Component Value Date/Time   NA 142 11/29/2012 1051   NA 138 01/31/2012 1030   K 4.3 11/29/2012 1051   K 4.9 01/31/2012 1030   CL 102 09/07/2012 1137   CL 98 01/31/2012 1030   CO2 27 11/29/2012 1051   CO2 28 01/31/2012 1030   BUN 17.4 11/29/2012 1051   BUN 18 01/31/2012 1030   CREATININE 0.8 11/29/2012 1051   CREATININE 0.78 01/31/2012 1030      Component Value Date/Time   CALCIUM 9.5 11/29/2012 1051   CALCIUM 9.8 01/31/2012 1030   ALKPHOS 74 11/29/2012 1051   ALKPHOS 75 01/31/2012 1030   AST 23 11/29/2012 1051   AST 32 01/31/2012 1030   ALT 29 11/29/2012 1051   ALT 23 01/31/2012 1030   BILITOT 0.53 11/29/2012 1051   BILITOT 0.4 01/31/2012 1030       RADIOGRAPHIC STUDIES:  No results found.  ASSESSMENT: 77 year old female with  #1 stage II A. (T1 C. N1 a  cM0) invasive ductal carcinoma Measuring 1.8 cm ER positive PR positive HER-2/neu positive. One sentinel node was positive for metastatic disease.  #2 patient was a  good candidate for adjuvant HER-2-based therapy. Patient underwent treatment with Taxotere carboplatinum and Herceptin. The Taxotere carboplatinum will be given every 3 weeks with day 2 Neulasta for a total of 6 cycles. The Herceptin was be given weekly for the duration of the chemotherapy and then changed to every 3 weeks to complete one year of Herceptin therapy.  #3 Patient has completed radiation therapy with Dr. Dayton Scrape and was started on Letrozole afterward in 09/2012.  She unfortunately was unable to tolerate the Letrozole.    PLAN:    #1 patient is doing well and will proceed with herceptin today.  She will also try to start taking Arimidex daily and call us if she has any difficulties.  I spent a great deal of time discussing the patients pathology, aromatase inhibitors, and benefits/risks with the patient and her son today.    #2 She will return in 3 weeks for her next lab, appointment, and Herceptin.    All questions were answered. The patient knows to call the clinic with any problems, questions or concerns. We can certainly see the patient much sooner if necessary.  I spent 40 minutes counseling the patient face to face. The total time spent in the appointment was 50 minutes.  Cherie Ouch Lyn Hollingshead, NP Medical Oncology [facility] Phone: [phone]

## 2012-12-05 ENCOUNTER — Other Ambulatory Visit (HOSPITAL_COMMUNITY): Payer: Self-pay | Admitting: *Deleted

## 2012-12-05 MED ORDER — CARVEDILOL 6.25 MG PO TABS: 6.2500 mg | ORAL_TABLET | Freq: Two times a day (BID) | ORAL | Status: DC

## 2012-12-20 ENCOUNTER — Other Ambulatory Visit (HOSPITAL_BASED_OUTPATIENT_CLINIC_OR_DEPARTMENT_OTHER): Payer: Medicare Other | Admitting: Lab

## 2012-12-20 ENCOUNTER — Telehealth: Payer: Self-pay | Admitting: *Deleted

## 2012-12-20 ENCOUNTER — Ambulatory Visit (HOSPITAL_BASED_OUTPATIENT_CLINIC_OR_DEPARTMENT_OTHER): Payer: Medicare Other | Admitting: Adult Health

## 2012-12-20 ENCOUNTER — Ambulatory Visit (HOSPITAL_BASED_OUTPATIENT_CLINIC_OR_DEPARTMENT_OTHER): Payer: Medicare Other

## 2012-12-20 ENCOUNTER — Encounter: Payer: Self-pay | Admitting: Adult Health

## 2012-12-20 VITALS — BP 147/86 | HR 77 | Temp 98.5°F | Resp 20 | Ht 65.0 in | Wt 182.8 lb

## 2012-12-20 DIAGNOSIS — C50419 Malignant neoplasm of upper-outer quadrant of unspecified female breast: Secondary | ICD-10-CM

## 2012-12-20 DIAGNOSIS — Z5112 Encounter for antineoplastic immunotherapy: Secondary | ICD-10-CM

## 2012-12-20 DIAGNOSIS — Z170 Estrogen receptor positive status [ER+]: Secondary | ICD-10-CM

## 2012-12-20 DIAGNOSIS — L989 Disorder of the skin and subcutaneous tissue, unspecified: Secondary | ICD-10-CM

## 2012-12-20 DIAGNOSIS — C50911 Malignant neoplasm of unspecified site of right female breast: Secondary | ICD-10-CM

## 2012-12-20 DIAGNOSIS — I889 Nonspecific lymphadenitis, unspecified: Secondary | ICD-10-CM

## 2012-12-20 LAB — COMPREHENSIVE METABOLIC PANEL (CC13)
ALT: 24 U/L (ref 0–55)
AST: 22 U/L (ref 5–34)
Alkaline Phosphatase: 77 U/L (ref 40–150)
CO2: 25 mEq/L (ref 22–29)
Creatinine: 0.8 mg/dL (ref 0.6–1.1)
Sodium: 141 mEq/L (ref 136–145)
Total Bilirubin: 0.56 mg/dL (ref 0.20–1.20)

## 2012-12-20 LAB — CBC WITH DIFFERENTIAL/PLATELET
BASO%: 0.2 % (ref 0.0–2.0)
EOS%: 4.2 % (ref 0.0–7.0)
LYMPH%: 20.6 % (ref 14.0–49.7)
MCH: 30 pg (ref 25.1–34.0)
MCHC: 34 g/dL (ref 31.5–36.0)
MCV: 88.2 fL (ref 79.5–101.0)
MONO%: 9.4 % (ref 0.0–14.0)
Platelets: 247 10*3/uL (ref 145–400)
RBC: 4.33 10*6/uL (ref 3.70–5.45)
RDW: 13.9 % (ref 11.2–14.5)
nRBC: 0 % (ref 0–0)

## 2012-12-20 MED ORDER — HEPARIN SOD (PORK) LOCK FLUSH 100 UNIT/ML IV SOLN
500.0000 [IU] | Freq: Once | INTRAVENOUS | Status: AC | PRN
  Administered 2012-12-20: 500 [IU]
  Filled 2012-12-20: qty 5

## 2012-12-20 MED ORDER — AZITHROMYCIN 250 MG PO TABS: ORAL_TABLET | ORAL | Status: DC

## 2012-12-20 MED ORDER — ACETAMINOPHEN 325 MG PO TABS
ORAL_TABLET | ORAL | Status: AC
  Filled 2012-12-20: qty 2

## 2012-12-20 MED ORDER — TRASTUZUMAB CHEMO INJECTION 440 MG
6.0000 mg/kg | Freq: Once | INTRAVENOUS | Status: AC
  Administered 2012-12-20: 504 mg via INTRAVENOUS
  Filled 2012-12-20: qty 24

## 2012-12-20 MED ORDER — ACETAMINOPHEN 325 MG PO TABS
650.0000 mg | ORAL_TABLET | Freq: Once | ORAL | Status: AC
  Administered 2012-12-20: 650 mg via ORAL

## 2012-12-20 MED ORDER — SODIUM CHLORIDE 0.9 % IV SOLN
Freq: Once | INTRAVENOUS | Status: AC
  Administered 2012-12-20: 14:00:00 via INTRAVENOUS

## 2012-12-20 MED ORDER — SODIUM CHLORIDE 0.9 % IJ SOLN
10.0000 mL | INTRAMUSCULAR | Status: DC | PRN
  Administered 2012-12-20: 10 mL
  Filled 2012-12-20: qty 10

## 2012-12-20 NOTE — Telephone Encounter (Signed)
Per staff message and POF I have scheduled appts.  JMW  

## 2012-12-20 NOTE — Patient Instructions (Addendum)
[facility] Discharge Instructions for Patients Receiving Chemotherapy  Today you received the following chemotherapy agents Herceptin.  To help prevent nausea and vomiting after your treatment, we encourage you to take your nausea medication as prescribed.   If you develop nausea and vomiting that is not controlled by your nausea medication, call the clinic.   BELOW ARE SYMPTOMS THAT SHOULD BE REPORTED IMMEDIATELY:  *FEVER GREATER THAN 100.5 F  *CHILLS WITH OR WITHOUT FEVER  NAUSEA AND VOMITING THAT IS NOT CONTROLLED WITH YOUR NAUSEA MEDICATION  *UNUSUAL SHORTNESS OF BREATH  *UNUSUAL BRUISING OR BLEEDING  TENDERNESS IN MOUTH AND THROAT WITH OR WITHOUT PRESENCE OF ULCERS  *URINARY PROBLEMS  *BOWEL PROBLEMS  UNUSUAL RASH Items with * indicate a potential emergency and should be followed up as soon as possible.  Feel free to call the clinic you have any questions or concerns. The clinic phone number is [phone].    

## 2012-12-20 NOTE — Progress Notes (Addendum)
OFFICE PROGRESS NOTE  CC  [facility], MD [address] [facility] [place] [address] Dr. Cyndia Bent Dr. Chipper Herb  DIAGNOSIS: 77 year old female with new diagnosis of invasive ductal carcinoma that is ER positive PR positive HER-2/neu positive by [facility] Testing  PRIOR THERAPY:  #1 patient was originally seenBy me on 02/21/2012 when she was diagnosed with invasive ductal carcinoma of the right breast stage II a with a positive lymph node. She underwent a lumpectomy and sentinel lymph node biopsy.The final pathology revealed a 1.8 cm invasive ductal carcinoma with low grade ductal carcinoma in situ the tumor was grade 2 ER positive PR positive Ki-67 was 11%. HER-2/neu was equivocal by cish.  #2 she had a sentinel lymph node biopsy one node was positive for metastatic disease. Making her a stage II A.  #3 patient and I and her family had an extensive discussion at the original meeting and we sent her tumor for HER-2/neu testing by [facility]. By hermark  HER-2/neu is positive.  #4 Patient received combination chemotherapy consisting of Taxotere carboplatinum and Herceptin Beginning 03/30/2012 x 6 cycles.    #5 Patient underwent adjuvant radiation therapy starting 08/23/12 through 10/02/12.    #6 Every 3 week herceptin began 08/17/12.  Letrozole therapy started in July 2014 this was stopped in 10/2012, and she was started on Arimidex in 11/2012 and is tolerating it well.      CURRENT THERAPY: every 3 week Herceptin, daily Arimidex  INTERVAL HISTORY: Heather Vega 77 y.o. female is doing well today.  She was started on Arimidex therapy and is tolerating it well.  She has occasional aches and pains, but otherwise is tolerating the therapy without issues.  She does have sore lymph nodes in her throat, however, she denies fevers, chills, nausea, vomiting, chest pain, DOE, orthopnea, or further concerns.  She would like a dermatology consult for skin lesions  on her back.    MEDICAL HISTORY: Past Medical History  Diagnosis Date  . Hyperlipidemia     doesn't require meds  . Asthma     pt states its "not bad"  . Back pain     buldging disc and pt says 2 are "flat"  . Hemorrhoids   . History of colon polyps   . Diverticulosis   . Breast cancer     breast right  . Hard of hearing   . Diabetes mellitus without complication     takes Metformin daily,Type II  . Allergy   . Cataract     b/l surgery  . Hypothyroidism     hx of;but has been off of meds >78yrs ago  . Wears glasses   . Hypertension     history, was on b/p  meds years ago  . PONV (postoperative nausea and vomiting)   . History of chemotherapy 03/30/2012-07/20/2012    6 cycles of taxotere, carboplatinum, herceptin    ALLERGIES:  has No Known Allergies.  MEDICATIONS:  Current Outpatient Prescriptions  Medication Sig Dispense Refill  . anastrozole (ARIMIDEX) 1 MG tablet Take 1 tablet (1 mg total) by mouth daily.  30 tablet  3  . aspirin 81 MG tablet Take 81 mg by mouth daily.      . B Complex-C (SUPER B COMPLEX PO) Take 1 capsule by mouth daily.      . carvedilol (COREG) 6.25 MG tablet Take 1 tablet (6.25 mg total) by mouth 2 (two) times daily with a meal.  60 tablet  3  . CINNAMON PO Take 1 tablet by mouth 2 (two) times daily.       Marland Kitchen lidocaine-prilocaine (EMLA) cream Apply topically as needed.  30 g  7  . LORazepam (ATIVAN) 0.5 MG tablet Take 1 tablet (0.5 mg total) by mouth every 6 (six) hours as needed (Nausea or vomiting).  30 tablet  1  . metFORMIN (GLUCOPHAGE) 1000 MG tablet Take 1,000 mg by mouth 2 (two) times daily with a meal.      . Multiple Vitamins-Minerals (MULTIVITAMIN WITH MINERALS) tablet Take 1 tablet by mouth daily.      . naproxen sodium (ANAPROX) 220 MG tablet Take 220 mg by mouth as needed.      . Vitamins C E (CRANBERRY CONCENTRATE PO) Take 1 tablet by mouth 2 (two) times daily.       Marland Kitchen azithromycin (ZITHROMAX Z-PAK) 250 MG tablet 2 tabs po on day 1,  then one tab daily until complete  6 each  0   No current facility-administered medications for this visit.    SURGICAL HISTORY:  Past Surgical History  Procedure Laterality Date  . Nose surgery  1953  . Tonsillectomy  1955  . Nasal fracture surgery      as a child x 2  . Tonsillectomy    . Breast surgery  70's    right  . Appendectomy    . Bilateral cataract surgery    . Colonoscopy    . Neuroplasty / transposition median nerve at carpal tunnel bilateral    . Cardiac catheterization  20+yrs ago  . Breast lumpectomy with needle localization and axillary sentinel lymph node bx  02/07/2012    Procedure: BREAST LUMPECTOMY WITH NEEDLE LOCALIZATION AND AXILLARY SENTINEL LYMPH NODE BX;  Surgeon: Currie Paris, MD;  Location: [facility] OR;  Service: General;  Laterality: Right;  Right needle loc lumpectomy and Senitel Lymph Node Biopsy   . Abdominal hysterectomy      ovaries intact  . Portacath placement  03/26/2012    Procedure: INSERTION PORT-A-CATH;  Surgeon: Currie Paris, MD;  Location: [facility];  Service: General;  Laterality: Left;    REVIEW OF SYSTEMS:   A 10 point review of systems was conducted and is otherwise negative except for what is noted above.     PHYSICAL EXAMINATION: Blood pressure 147/86, pulse 77, temperature 98.5 F (36.9 C), temperature source Oral, resp. rate 20, height 5\' 5"  (1.651 m), weight 182 lb 12.8 oz (82.918 kg). Body mass index is 30.42 kg/(m^2). General: Patient is a well appearing female in no acute distress HEENT: PERRLA, sclerae anicteric no conjunctival pallor, MMM Neck: supple, no lymphadenopathy, markedly tender left cervical chain lymph nodes Lungs: clear to auscultation bilaterally, no wheezes, rhonchi, or rales Cardiovascular: regular rate rhythm, S1, S2, no murmurs, rubs or gallops Abdomen: Soft, non-tender, non-distended, normoactive bowel sounds, no HSM Extremities: warm and well perfused, no clubbing, cyanosis, or  edema Skin: No rashes or lesions Neuro: Non-focal Right breast lumpectomy site without nodularity ECOG PERFORMANCE STATUS: 0 - Asymptomatic  LABORATORY DATA: Lab Results  Component Value Date   WBC 5.5 12/20/2012   HGB 13.0 12/20/2012   HCT 38.2 12/20/2012   MCV 88.2 12/20/2012   PLT 247 12/20/2012      Chemistry      Component Value Date/Time   NA 141 12/20/2012 1119   NA 138 01/31/2012 1030   K 4.2 12/20/2012 1119   K 4.9 01/31/2012 1030   CL 102  09/07/2012 1137   CL 98 01/31/2012 1030   CO2 25 12/20/2012 1119   CO2 28 01/31/2012 1030   BUN 16.8 12/20/2012 1119   BUN 18 01/31/2012 1030   CREATININE 0.8 12/20/2012 1119   CREATININE 0.78 01/31/2012 1030      Component Value Date/Time   CALCIUM 9.7 12/20/2012 1119   CALCIUM 9.8 01/31/2012 1030   ALKPHOS 77 12/20/2012 1119   ALKPHOS 75 01/31/2012 1030   AST 22 12/20/2012 1119   AST 32 01/31/2012 1030   ALT 24 12/20/2012 1119   ALT 23 01/31/2012 1030   BILITOT 0.56 12/20/2012 1119   BILITOT 0.4 01/31/2012 1030       RADIOGRAPHIC STUDIES:  No results found.  ASSESSMENT: 77 year old female with  #1 stage II A. (T1 C. N1 a  cM0) invasive ductal carcinoma Measuring 1.8 cm ER positive PR positive HER-2/neu positive. One sentinel node was positive for metastatic disease.  #2 patient was a good candidate for adjuvant HER-2-based therapy. Patient underwent treatment with Taxotere carboplatinum and Herceptin. The Taxotere carboplatinum was given every 3 weeks with day 2 Neulasta for a total of 6 cycles. The Herceptin was be given weekly for the duration of the chemotherapy and she is now receiving every 3 week herceptin to complete out a year of Herceptin therapy.    #3 Patient has completed radiation therapy with Dr. Dayton Scrape and was started on Letrozole afterward in 09/2012.  She unfortunately was unable to tolerate the Letrozole and we started Arimidex which she is tolerating well.    PLAN:    #1 Doing well, no sign of recurrence. Proceed with  Herceptin, continue daily Arimidex.      #2 She will return in 3 weeks for her next lab, appointment, and Herceptin.    #3 We will refer her to dermatology for her skin lesions on her back.    #4 I will prescribe a zpak for the lymphadenitis.  She has taken this before without difficulty.    All questions were answered. The patient knows to call the clinic with any problems, questions or concerns. We can certainly see the patient much sooner if necessary.  I spent 25 minutes counseling the patient face to face. The total time spent in the appointment was 30 minutes.  Cherie Ouch Lyn Hollingshead, NP Medical Oncology [facility] Phone: [phone]   ATTENDING'S ATTESTATION:  I personally reviewed patient's chart, examined patient myself, formulated the treatment plan as followed.    Overall she is doing well. She continues to tolerate Herceptin very nicely. She has no complaints. We will continue to see her every 3 weeks for Herceptin.  Drue Second, MD Medical/Oncology [facility] [phone] (beeper) [phone] (Office)  01/14/2013, 9:26 AM

## 2012-12-20 NOTE — Telephone Encounter (Signed)
walkk to tx area and gv pt appt for Heather Vega for a derm appt on 12/31/12@ 3:30pm..the patient is aware...td

## 2012-12-20 NOTE — Telephone Encounter (Signed)
appts made and printed. Pt is aware that her tx's will be added. i emailed MW to add the tx's. Pt is aware that im working on her dermatology appt. i have faxed the demo to [facility] office. i have also emailed LA informing her that Nicholas Lose is currently not taking any new pts, but other doctors in the office will, however Raynelle Fanning need more detail of why the pt need an appt.

## 2013-01-10 ENCOUNTER — Other Ambulatory Visit (HOSPITAL_BASED_OUTPATIENT_CLINIC_OR_DEPARTMENT_OTHER): Payer: Medicare Other | Admitting: Lab

## 2013-01-10 ENCOUNTER — Encounter: Payer: Self-pay | Admitting: Adult Health

## 2013-01-10 ENCOUNTER — Ambulatory Visit (HOSPITAL_BASED_OUTPATIENT_CLINIC_OR_DEPARTMENT_OTHER): Payer: Medicare Other

## 2013-01-10 ENCOUNTER — Telehealth: Payer: Self-pay | Admitting: *Deleted

## 2013-01-10 ENCOUNTER — Ambulatory Visit (HOSPITAL_BASED_OUTPATIENT_CLINIC_OR_DEPARTMENT_OTHER): Payer: Medicare Other | Admitting: Adult Health

## 2013-01-10 VITALS — BP 152/78 | HR 77 | Temp 98.2°F | Resp 20 | Ht 65.0 in | Wt 183.8 lb

## 2013-01-10 DIAGNOSIS — C50911 Malignant neoplasm of unspecified site of right female breast: Secondary | ICD-10-CM

## 2013-01-10 DIAGNOSIS — C50419 Malignant neoplasm of upper-outer quadrant of unspecified female breast: Secondary | ICD-10-CM

## 2013-01-10 DIAGNOSIS — Z5112 Encounter for antineoplastic immunotherapy: Secondary | ICD-10-CM

## 2013-01-10 DIAGNOSIS — C773 Secondary and unspecified malignant neoplasm of axilla and upper limb lymph nodes: Secondary | ICD-10-CM

## 2013-01-10 DIAGNOSIS — Z170 Estrogen receptor positive status [ER+]: Secondary | ICD-10-CM

## 2013-01-10 LAB — CBC WITH DIFFERENTIAL/PLATELET
BASO%: 0.4 % (ref 0.0–2.0)
HCT: 36.5 % (ref 34.8–46.6)
LYMPH%: 17.9 % (ref 14.0–49.7)
MCHC: 33.7 g/dL (ref 31.5–36.0)
MCV: 89.2 fL (ref 79.5–101.0)
MONO#: 0.5 10*3/uL (ref 0.1–0.9)
MONO%: 8.8 % (ref 0.0–14.0)
NEUT%: 68 % (ref 38.4–76.8)
Platelets: 245 10*3/uL (ref 145–400)
WBC: 5.3 10*3/uL (ref 3.9–10.3)

## 2013-01-10 LAB — COMPREHENSIVE METABOLIC PANEL (CC13)
Anion Gap: 10 mEq/L (ref 3–11)
BUN: 18.2 mg/dL (ref 7.0–26.0)
CO2: 26 mEq/L (ref 22–29)
Calcium: 9.1 mg/dL (ref 8.4–10.4)
Chloride: 105 mEq/L (ref 98–109)
Creatinine: 0.8 mg/dL (ref 0.6–1.1)

## 2013-01-10 MED ORDER — SODIUM CHLORIDE 0.9 % IV SOLN
Freq: Once | INTRAVENOUS | Status: AC
  Administered 2013-01-10: 13:00:00 via INTRAVENOUS

## 2013-01-10 MED ORDER — HEPARIN SOD (PORK) LOCK FLUSH 100 UNIT/ML IV SOLN
500.0000 [IU] | Freq: Once | INTRAVENOUS | Status: AC | PRN
  Administered 2013-01-10: 500 [IU]
  Filled 2013-01-10: qty 5

## 2013-01-10 MED ORDER — TRASTUZUMAB CHEMO INJECTION 440 MG
6.0000 mg/kg | Freq: Once | INTRAVENOUS | Status: AC
  Administered 2013-01-10: 504 mg via INTRAVENOUS
  Filled 2013-01-10: qty 24

## 2013-01-10 MED ORDER — ACETAMINOPHEN 325 MG PO TABS
650.0000 mg | ORAL_TABLET | Freq: Once | ORAL | Status: AC
  Administered 2013-01-10: 650 mg via ORAL

## 2013-01-10 MED ORDER — SODIUM CHLORIDE 0.9 % IJ SOLN
10.0000 mL | INTRAMUSCULAR | Status: DC | PRN
  Administered 2013-01-10: 10 mL
  Filled 2013-01-10: qty 10

## 2013-01-10 MED ORDER — ACETAMINOPHEN 325 MG PO TABS
ORAL_TABLET | ORAL | Status: AC
  Filled 2013-01-10: qty 2

## 2013-01-10 NOTE — Patient Instructions (Signed)
Doing well.  Proceed with Herceptin.  Please call us if you have any questions or concerns.    

## 2013-01-10 NOTE — Patient Instructions (Signed)
[facility] Discharge Instructions for Patients Receiving Chemotherapy  Today you received the following chemotherapy/antibody agent: Herceptin   To help prevent nausea and vomiting after your treatment, we encourage you to take your nausea medication as prescribed.   If you develop nausea and vomiting that is not controlled by your nausea medication, call the clinic.   BELOW ARE SYMPTOMS THAT SHOULD BE REPORTED IMMEDIATELY:  *FEVER GREATER THAN 100.5 F  *CHILLS WITH OR WITHOUT FEVER  NAUSEA AND VOMITING THAT IS NOT CONTROLLED WITH YOUR NAUSEA MEDICATION  *UNUSUAL SHORTNESS OF BREATH  *UNUSUAL BRUISING OR BLEEDING  TENDERNESS IN MOUTH AND THROAT WITH OR WITHOUT PRESENCE OF ULCERS  *URINARY PROBLEMS  *BOWEL PROBLEMS  UNUSUAL RASH Items with * indicate a potential emergency and should be followed up as soon as possible.  Feel free to call the clinic you have any questions or concerns. The clinic phone number is [phone].

## 2013-01-10 NOTE — Progress Notes (Signed)
OFFICE PROGRESS NOTE  CC  [facility], MD [address] [facility] [place] [address] Dr. Cyndia Bent Dr. Chipper Herb  DIAGNOSIS: 77 year old female with new diagnosis of invasive ductal carcinoma that is ER positive PR positive HER-2/neu positive by her[facility] Testing  PRIOR THERAPY:  #1 patient was originally seenBy me on 02/21/2012 when she was diagnosed with invasive ductal carcinoma of the right breast stage II a with a positive lymph node. She underwent a lumpectomy and sentinel lymph node biopsy.The final pathology revealed a 1.8 cm invasive ductal carcinoma with low grade ductal carcinoma in situ the tumor was grade 2 ER positive PR positive Ki-67 was 11%. HER-2/neu was equivocal by cish.  #2 she had a sentinel lymph node biopsy one node was positive for metastatic disease. Making her a stage II A.  #3 patient and I and her family had an extensive discussion at the original meeting and we sent her tumor for HER-2/neu testing by [facility]. By hermark  HER-2/neu is positive.  #4 Patient received combination chemotherapy consisting of Taxotere carboplatinum and Herceptin Beginning 03/30/2012 x 6 cycles.    #5 Patient underwent adjuvant radiation therapy starting 08/23/12 through 10/02/12.    #6 Every 3 week herceptin began 08/17/12.  Letrozole therapy started in July 2014 this was stopped in 10/2012, and she was started on Arimidex in 11/2012 and is tolerating it well.      CURRENT THERAPY: every 3 week Herceptin, daily Arimidex  INTERVAL HISTORY: Heather Vega 77 y.o. female is doing well today.  She is tolerating the herceptin IV and po arimidex well.  She denies fevers, chills, joint aches, hot flashes, chest pain, palpitations, DOE, swelling or any further concerns.    MEDICAL HISTORY: Past Medical History  Diagnosis Date  . Hyperlipidemia     doesn't require meds  . Asthma     pt states its "not bad"  . Back pain     buldging disc and  pt says 2 are "flat"  . Hemorrhoids   . History of colon polyps   . Diverticulosis   . Breast cancer     breast right  . Hard of hearing   . Diabetes mellitus without complication     takes Metformin daily,Type II  . Allergy   . Cataract     b/l surgery  . Hypothyroidism     hx of;but has been off of meds >20yrs ago  . Wears glasses   . Hypertension     history, was on b/p  meds years ago  . PONV (postoperative nausea and vomiting)   . History of chemotherapy 03/30/2012-07/20/2012    6 cycles of taxotere, carboplatinum, herceptin    ALLERGIES:  has No Known Allergies.  MEDICATIONS:  Current Outpatient Prescriptions  Medication Sig Dispense Refill  . anastrozole (ARIMIDEX) 1 MG tablet Take 1 tablet (1 mg total) by mouth daily.  30 tablet  3  . aspirin 81 MG tablet Take 81 mg by mouth daily.      Marland Kitchen azithromycin (ZITHROMAX Z-PAK) 250 MG tablet 2 tabs po on day 1, then one tab daily until complete  6 each  0  . B Complex-C (SUPER B COMPLEX PO) Take 1 capsule by mouth daily.      . carvedilol (COREG) 6.25 MG tablet Take 1 tablet (6.25 mg total) by mouth 2 (two) times daily with a meal.  60 tablet  3  . CINNAMON PO Take 1 tablet by mouth  2 (two) times daily.       Marland Kitchen lidocaine-prilocaine (EMLA) cream Apply topically as needed.  30 g  7  . LORazepam (ATIVAN) 0.5 MG tablet Take 1 tablet (0.5 mg total) by mouth every 6 (six) hours as needed (Nausea or vomiting).  30 tablet  1  . metFORMIN (GLUCOPHAGE) 1000 MG tablet Take 1,000 mg by mouth 2 (two) times daily with a meal.      . Multiple Vitamins-Minerals (MULTIVITAMIN WITH MINERALS) tablet Take 1 tablet by mouth daily.      . naproxen sodium (ANAPROX) 220 MG tablet Take 220 mg by mouth as needed.      . Vitamins C E (CRANBERRY CONCENTRATE PO) Take 1 tablet by mouth 2 (two) times daily.        No current facility-administered medications for this visit.    SURGICAL HISTORY:  Past Surgical History  Procedure Laterality Date  . Nose  surgery  1953  . Tonsillectomy  1955  . Nasal fracture surgery      as a child x 2  . Tonsillectomy    . Breast surgery  70's    right  . Appendectomy    . Bilateral cataract surgery    . Colonoscopy    . Neuroplasty / transposition median nerve at carpal tunnel bilateral    . Cardiac catheterization  20+yrs ago  . Breast lumpectomy with needle localization and axillary sentinel lymph node bx  02/07/2012    Procedure: BREAST LUMPECTOMY WITH NEEDLE LOCALIZATION AND AXILLARY SENTINEL LYMPH NODE BX;  Surgeon: Currie Paris, MD;  Location: [facility] OR;  Service: General;  Laterality: Right;  Right needle loc lumpectomy and Senitel Lymph Node Biopsy   . Abdominal hysterectomy      ovaries intact  . Portacath placement  03/26/2012    Procedure: INSERTION PORT-A-CATH;  Surgeon: Currie Paris, MD;  Location: [facility];  Service: General;  Laterality: Left;    REVIEW OF SYSTEMS:   A 10 point review of systems was conducted and is otherwise negative except for what is noted above.     PHYSICAL EXAMINATION: Blood pressure 152/78, pulse 77, temperature 98.2 F (36.8 C), temperature source Oral, resp. rate 20, height 5\' 5"  (1.651 m), weight 183 lb 12.8 oz (83.371 kg). Body mass index is 30.59 kg/(m^2). General: Patient is a well appearing female in no acute distress HEENT: PERRLA, sclerae anicteric no conjunctival pallor, MMM Neck: supple, no lymphadenopathy, markedly tender left cervical chain lymph nodes Lungs: clear to auscultation bilaterally, no wheezes, rhonchi, or rales Cardiovascular: regular rate rhythm, S1, S2, no murmurs, rubs or gallops Abdomen: Soft, non-tender, non-distended, normoactive bowel sounds, no HSM Extremities: warm and well perfused, no clubbing, cyanosis, or edema Skin: No rashes or lesions Neuro: Non-focal Right breast lumpectomy site without nodularity ECOG PERFORMANCE STATUS: 0 - Asymptomatic  LABORATORY DATA: Lab Results  Component Value  Date   WBC 5.3 01/10/2013   HGB 12.3 01/10/2013   HCT 36.5 01/10/2013   MCV 89.2 01/10/2013   PLT 245 01/10/2013      Chemistry      Component Value Date/Time   NA 141 12/20/2012 1119   NA 138 01/31/2012 1030   K 4.2 12/20/2012 1119   K 4.9 01/31/2012 1030   CL 102 09/07/2012 1137   CL 98 01/31/2012 1030   CO2 25 12/20/2012 1119   CO2 28 01/31/2012 1030   BUN 16.8 12/20/2012 1119   BUN 18 01/31/2012 1030   CREATININE  0.8 12/20/2012 1119   CREATININE 0.78 01/31/2012 1030      Component Value Date/Time   CALCIUM 9.7 12/20/2012 1119   CALCIUM 9.8 01/31/2012 1030   ALKPHOS 77 12/20/2012 1119   ALKPHOS 75 01/31/2012 1030   AST 22 12/20/2012 1119   AST 32 01/31/2012 1030   ALT 24 12/20/2012 1119   ALT 23 01/31/2012 1030   BILITOT 0.56 12/20/2012 1119   BILITOT 0.4 01/31/2012 1030       RADIOGRAPHIC STUDIES:  No results found.  ASSESSMENT: 77 year old female with  #1 stage II A. (T1 C. N1 a  cM0) invasive ductal carcinoma Measuring 1.8 cm ER positive PR positive HER-2/neu positive. One sentinel node was positive for metastatic disease.  #2 patient was a good candidate for adjuvant HER-2-based therapy. Patient underwent treatment with Taxotere carboplatinum and Herceptin. The Taxotere carboplatinum was given every 3 weeks with day 2 Neulasta for a total of 6 cycles. The Herceptin was be given weekly for the duration of the chemotherapy and she is now receiving every 3 week herceptin to complete out a year of Herceptin therapy.    #3 Patient has completed radiation therapy with Dr. Dayton Scrape and was started on Letrozole afterward in 09/2012.  She unfortunately was unable to tolerate the Letrozole and we started Arimidex which she is tolerating well.    PLAN:    #1 Doing well, no sign of recurrence. Proceed with Herceptin, continue daily Arimidex.  She is due to follow up with Dr. Gala Romney on 01/23/13 or shortly thereafter.  I requested this follow up today.    #2 Ms. Arsenau will return in 3  weeks for her next Herceptin.    All questions were answered. The patient knows to call the clinic with any problems, questions or concerns. We can certainly see the patient much sooner if necessary.  I spent 15 minutes counseling the patient face to face. The total time spent in the appointment was 30 minutes.  Illa Level, NP Medical Oncology [facility] [phone]

## 2013-01-10 NOTE — Telephone Encounter (Signed)
appts made and printed...td 

## 2013-01-30 ENCOUNTER — Ambulatory Visit (HOSPITAL_BASED_OUTPATIENT_CLINIC_OR_DEPARTMENT_OTHER)
Admission: RE | Admit: 2013-01-30 | Discharge: 2013-01-30 | Disposition: A | Discharge status: Home / Self Care | Payer: Medicare Other | Source: Ambulatory Visit | Attending: Internal Medicine | Admitting: Internal Medicine

## 2013-01-30 ENCOUNTER — Encounter (HOSPITAL_COMMUNITY): Payer: Self-pay

## 2013-01-30 ENCOUNTER — Ambulatory Visit (HOSPITAL_COMMUNITY)
Admission: RE | Admit: 2013-01-30 | Discharge: 2013-01-30 | Disposition: A | Discharge status: Home / Self Care | Payer: Medicare Other | Source: Ambulatory Visit | Attending: Family Medicine | Admitting: Family Medicine

## 2013-01-30 VITALS — BP 106/74 | HR 80 | Wt 185.4 lb

## 2013-01-30 DIAGNOSIS — C50919 Malignant neoplasm of unspecified site of unspecified female breast: Secondary | ICD-10-CM

## 2013-01-30 DIAGNOSIS — C50911 Malignant neoplasm of unspecified site of right female breast: Secondary | ICD-10-CM

## 2013-01-30 DIAGNOSIS — I10 Essential (primary) hypertension: Secondary | ICD-10-CM | POA: Insufficient documentation

## 2013-01-30 DIAGNOSIS — I059 Rheumatic mitral valve disease, unspecified: Secondary | ICD-10-CM | POA: Insufficient documentation

## 2013-01-30 DIAGNOSIS — R079 Chest pain, unspecified: Secondary | ICD-10-CM

## 2013-01-30 NOTE — Patient Instructions (Addendum)
Follow up in 1 month with an ECHO  Your physician has requested that you have en exercise stress myoview. For further information please visit [email or web address] Please follow instruction sheet, as given.

## 2013-01-30 NOTE — Progress Notes (Signed)
Patient ID: TRAEH MILROY, female   DOB: [birth year], 77 y.o.   MRN: [id number] Oncologist: Dr Welton Flakes General Surgeon: Dr Jamey Ripa Radiation Oncologist: Dr Dayton Scrape PCP: Dr Sharlot Gowda  HPI: Ms. Schnitzer is a 77 year old female with new diagnosis of invasive ductal carcinoma that is ER positive PR positive HER-2/neu positive, hypothyroid (not on medicaitons), DM II, and HOH.   02/21/2012 when she was diagnosed with invasive ductal carcinoma of the right breast stage II a with a positive lymph node. She underwent a lumpectomy and sentinel lymph node biopsy.The final pathology revealed a 1.8 cm invasive ductal carcinoma with low grade ductal carcinoma in situ the tumor was grade 2 ER positive PR positive Ki-67 was 11%. HER-2/neu was equivocal. Plan for Taxotere carboplatinum and Herceptin. Taxotere carboplatinum will be given every 3 weeks with day 2 Neulasta for a total of 6 cycles. The Herceptin will be given weekly for the duration of the chemotherapy and then changed to every 3 weeks to complete one year of Herceptin therapy. Plans to complete Herceptin in January 2015.   No known heart disease. Had cath in 1996 in [place], [place]. 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 Clean cors. Normal EF. Says she has white coat HTN  ECHO  03/29/12 EF 60-65% lateral S' 8.3 severe LVH 07/16/12 EF 60% lat s' 9.7 grade 1 diastolic dysfunction  10/23/12 EF 55-60% lateral s' 9.4 global strain -16.6 01/30/13 EF 55% Lateral S' 7.5 global strain -15.7, mild MR, normal RV, 4.3 cm ascending aorta - poor windows  She returns for follow up.  Denies SOB/PND/Orthopnea. Says she has had 2 episodes of chest pain that lasted a couple minutes. Says she was resting when it happened. No exertional chest pain/tightness.  She continues on Herceptin every 3 weeks.   Review of Systems: All pertinent positives and negatives as in HPI, otherwise negative    Past Medical History  Diagnosis Date  . Hyperlipidemia     doesn't  require meds  . Asthma     pt states its "not bad"  . Back pain     buldging disc and pt says 2 are "flat"  . Hemorrhoids   . History of colon polyps   . Diverticulosis   . Breast cancer     breast right  . Hard of hearing   . Diabetes mellitus without complication     takes Metformin daily,Type II  . Allergy   . Cataract     b/l surgery  . Hypothyroidism     hx of;but has been off of meds >93yrs ago  . Wears glasses   . Hypertension     history, was on b/p  meds years ago  . PONV (postoperative nausea and vomiting)   . History of chemotherapy 03/30/2012-07/20/2012    6 cycles of taxotere, carboplatinum, herceptin    Current Outpatient Prescriptions  Medication Sig Dispense Refill  . anastrozole (ARIMIDEX) 1 MG tablet Take 1 tablet (1 mg total) by mouth daily.  30 tablet  3  . aspirin 81 MG tablet Take 81 mg by mouth daily.      . B Complex-C (SUPER B COMPLEX PO) Take 1 capsule by mouth daily.      . carvedilol (COREG) 6.25 MG tablet Take 1 tablet (6.25 mg total) by mouth 2 (two) times daily with a meal.  60 tablet  3  . CINNAMON PO Take 1 tablet by mouth 2 (two) times daily.       Marland Kitchen lidocaine-prilocaine (  EMLA) cream Apply topically as needed.  30 g  7  . metFORMIN (GLUCOPHAGE) 1000 MG tablet Take 1,000 mg by mouth 2 (two) times daily with a meal.      . Multiple Vitamins-Minerals (MULTIVITAMIN WITH MINERALS) tablet Take 1 tablet by mouth daily.      . naproxen sodium (ANAPROX) 220 MG tablet Take 220 mg by mouth as needed.      . Vitamins C E (CRANBERRY CONCENTRATE PO) Take 1 tablet by mouth 2 (two) times daily.        No current facility-administered medications for this encounter.     No Known Allergies  PHYSICAL EXAM: Filed Vitals:   01/30/13 1343  BP: 106/74  Pulse: 80   General:  elderly. No respiratory difficulty HEENT: normal Neck: supple. no JVD. Carotids 2+ bilat; no bruits. No lymphadenopathy or thryomegaly appreciated. Cor: PMI nondisplaced. Regular rate  & rhythm. No rubs, gallops or murmurs. Lungs: clear Abdomen: soft, nontender, nondistended. No hepatosplenomegaly. No bruits or masses. Good bowel sounds. Extremities: no cyanosis, clubbing, rash, tr edema Neuro: alert & oriented x 3, cranial nerves grossly intact except for reduced hearing. moves all 4 extremities w/o difficulty. Affect pleasant  EKG: NSR 68 bpm, normal.   ASSESSMENT & PLAN: 1) Breast CA -HER-2/neu positive. Remains on Herceptin every 3 weeks with estimated completion date January 2015. Dr Shirlee Latch discussed and reviewed ECHOs. EF stable but lateral s' lower.  Global longitudinal strain is about the same as prior.  I think that the difference in lateral s' is related to technique (more extensive tissue doppler imaging on prior study). Therefore, I am not going to have her stop Herceptin.  I will have her come back in a month with another echo to make sure that there is no worsening.  Continue Coreg.   2) Chest Pain - ECG normal. Will schedule ETT-Cardiolite.    3) HTN- stable. Continue carvedilol 6.25 mg twice a day  Follow up in 1 month with an ECHO   CLEGG,AMY NP-C  1:51 PM  Patient seen with NP, agree with the above note.   Plan ETT-Cardiolite to assess atypical chest pain.   I reviewed today's echo.  This study was more difficult than the prior study.  Lateral s' is lower but I am not convinced that this is a significant difference as tissue doppler imaging was less extensive on today's echo.  I will bring her back in a month to repeat echo to re-evaluate lateral s' and strain.  Continue Coreg.   Marca Ancona 01/30/2013

## 2013-01-30 NOTE — Progress Notes (Signed)
  Echocardiogram 2D Echocardiogram has been performed.  Heather Vega 01/30/2013, 1:40 PM

## 2013-01-31 ENCOUNTER — Other Ambulatory Visit: Payer: Self-pay

## 2013-01-31 ENCOUNTER — Ambulatory Visit (HOSPITAL_BASED_OUTPATIENT_CLINIC_OR_DEPARTMENT_OTHER): Payer: Medicare Other

## 2013-01-31 ENCOUNTER — Other Ambulatory Visit (HOSPITAL_BASED_OUTPATIENT_CLINIC_OR_DEPARTMENT_OTHER): Payer: Medicare Other | Admitting: Lab

## 2013-01-31 VITALS — BP 140/72 | HR 75 | Temp 98.5°F | Resp 18

## 2013-01-31 DIAGNOSIS — C773 Secondary and unspecified malignant neoplasm of axilla and upper limb lymph nodes: Secondary | ICD-10-CM

## 2013-01-31 DIAGNOSIS — C50419 Malignant neoplasm of upper-outer quadrant of unspecified female breast: Secondary | ICD-10-CM

## 2013-01-31 DIAGNOSIS — C50911 Malignant neoplasm of unspecified site of right female breast: Secondary | ICD-10-CM

## 2013-01-31 DIAGNOSIS — Z5112 Encounter for antineoplastic immunotherapy: Secondary | ICD-10-CM

## 2013-01-31 LAB — COMPREHENSIVE METABOLIC PANEL (CC13)
AST: 22 U/L (ref 5–34)
Albumin: 3.6 g/dL (ref 3.5–5.0)
Anion Gap: 11 mEq/L (ref 3–11)
BUN: 17.7 mg/dL (ref 7.0–26.0)
CO2: 25 mEq/L (ref 22–29)
Calcium: 9.5 mg/dL (ref 8.4–10.4)
Chloride: 106 mEq/L (ref 98–109)
Creatinine: 0.7 mg/dL (ref 0.6–1.1)
Potassium: 4.4 mEq/L (ref 3.5–5.1)
Total Protein: 6.3 g/dL — ABNORMAL LOW (ref 6.4–8.3)

## 2013-01-31 LAB — CBC WITH DIFFERENTIAL/PLATELET
Basophils Absolute: 0 10*3/uL (ref 0.0–0.1)
Eosinophils Absolute: 0.3 10*3/uL (ref 0.0–0.5)
HGB: 12.6 g/dL (ref 11.6–15.9)
MONO#: 0.4 10*3/uL (ref 0.1–0.9)
NEUT#: 3.3 10*3/uL (ref 1.5–6.5)
RDW: 14.1 % (ref 11.2–14.5)
lymph#: 1.1 10*3/uL (ref 0.9–3.3)

## 2013-01-31 MED ORDER — ACETAMINOPHEN 325 MG PO TABS
ORAL_TABLET | ORAL | Status: AC
  Filled 2013-01-31: qty 2

## 2013-01-31 MED ORDER — HEPARIN SOD (PORK) LOCK FLUSH 100 UNIT/ML IV SOLN
500.0000 [IU] | Freq: Once | INTRAVENOUS | Status: AC | PRN
  Administered 2013-01-31: 500 [IU]
  Filled 2013-01-31: qty 5

## 2013-01-31 MED ORDER — SODIUM CHLORIDE 0.9 % IV SOLN
Freq: Once | INTRAVENOUS | Status: AC
  Administered 2013-01-31: 12:00:00 via INTRAVENOUS

## 2013-01-31 MED ORDER — TRASTUZUMAB CHEMO INJECTION 440 MG
6.0000 mg/kg | Freq: Once | INTRAVENOUS | Status: AC
  Administered 2013-01-31: 504 mg via INTRAVENOUS
  Filled 2013-01-31: qty 24

## 2013-01-31 MED ORDER — DIPHENHYDRAMINE HCL 25 MG PO CAPS: 50.0000 mg | ORAL_CAPSULE | Freq: Once | ORAL | Status: DC

## 2013-01-31 MED ORDER — ACETAMINOPHEN 325 MG PO TABS: 650.0000 mg | ORAL_TABLET | Freq: Once | ORAL | Status: DC

## 2013-01-31 MED ORDER — DIPHENHYDRAMINE HCL 25 MG PO CAPS
ORAL_CAPSULE | ORAL | Status: AC
  Filled 2013-01-31: qty 2

## 2013-01-31 MED ORDER — SODIUM CHLORIDE 0.9 % IJ SOLN
10.0000 mL | INTRAMUSCULAR | Status: DC | PRN
  Administered 2013-01-31: 10 mL
  Filled 2013-01-31: qty 10

## 2013-01-31 NOTE — Patient Instructions (Signed)

## 2013-01-31 NOTE — Progress Notes (Signed)
1325: Discharged to home, ambulatory in no distress.

## 2013-02-19 ENCOUNTER — Encounter: Payer: Self-pay | Admitting: Adult Health

## 2013-02-20 ENCOUNTER — Encounter: Payer: Self-pay | Admitting: *Deleted

## 2013-02-20 ENCOUNTER — Telehealth: Payer: Self-pay | Admitting: Oncology

## 2013-02-20 NOTE — Telephone Encounter (Signed)
lvm for pt with time change info for DEC 1.Marland KitchenMarland KitchenMarland Kitchen

## 2013-02-25 ENCOUNTER — Encounter: Payer: Self-pay | Admitting: Adult Health

## 2013-02-25 ENCOUNTER — Ambulatory Visit (HOSPITAL_BASED_OUTPATIENT_CLINIC_OR_DEPARTMENT_OTHER): Payer: Medicare Other | Admitting: Lab

## 2013-02-25 ENCOUNTER — Ambulatory Visit (HOSPITAL_BASED_OUTPATIENT_CLINIC_OR_DEPARTMENT_OTHER): Payer: Medicare Other | Admitting: Adult Health

## 2013-02-25 ENCOUNTER — Ambulatory Visit (HOSPITAL_BASED_OUTPATIENT_CLINIC_OR_DEPARTMENT_OTHER): Payer: Medicare Other

## 2013-02-25 ENCOUNTER — Other Ambulatory Visit: Payer: Medicare Other | Admitting: Lab

## 2013-02-25 VITALS — BP 135/78 | HR 81 | Temp 98.4°F | Resp 18 | Ht 65.0 in | Wt 182.6 lb

## 2013-02-25 DIAGNOSIS — C50911 Malignant neoplasm of unspecified site of right female breast: Secondary | ICD-10-CM

## 2013-02-25 DIAGNOSIS — C773 Secondary and unspecified malignant neoplasm of axilla and upper limb lymph nodes: Secondary | ICD-10-CM

## 2013-02-25 DIAGNOSIS — C50419 Malignant neoplasm of upper-outer quadrant of unspecified female breast: Secondary | ICD-10-CM

## 2013-02-25 DIAGNOSIS — Z5112 Encounter for antineoplastic immunotherapy: Secondary | ICD-10-CM

## 2013-02-25 DIAGNOSIS — Z170 Estrogen receptor positive status [ER+]: Secondary | ICD-10-CM

## 2013-02-25 LAB — CBC WITH DIFFERENTIAL/PLATELET
BASO%: 0.3 % (ref 0.0–2.0)
Basophils Absolute: 0 10*3/uL (ref 0.0–0.1)
Eosinophils Absolute: 0.2 10*3/uL (ref 0.0–0.5)
HCT: 38.6 % (ref 34.8–46.6)
HGB: 13 g/dL (ref 11.6–15.9)
LYMPH%: 15.1 % (ref 14.0–49.7)
MCV: 89.8 fL (ref 79.5–101.0)
MONO#: 0.4 10*3/uL (ref 0.1–0.9)
MONO%: 6.6 % (ref 0.0–14.0)
NEUT#: 4.9 10*3/uL (ref 1.5–6.5)
Platelets: 220 10*3/uL (ref 145–400)
RBC: 4.3 10*6/uL (ref 3.70–5.45)
WBC: 6.6 10*3/uL (ref 3.9–10.3)
nRBC: 0 % (ref 0–0)

## 2013-02-25 LAB — COMPREHENSIVE METABOLIC PANEL (CC13)
ALT: 26 U/L (ref 0–55)
AST: 21 U/L (ref 5–34)
Anion Gap: 12 mEq/L — ABNORMAL HIGH (ref 3–11)
CO2: 26 mEq/L (ref 22–29)
Calcium: 9.7 mg/dL (ref 8.4–10.4)
Chloride: 104 mEq/L (ref 98–109)
Creatinine: 0.8 mg/dL (ref 0.6–1.1)
Glucose: 113 mg/dl (ref 70–140)
Total Bilirubin: 0.3 mg/dL (ref 0.20–1.20)
Total Protein: 6.8 g/dL (ref 6.4–8.3)

## 2013-02-25 MED ORDER — TRASTUZUMAB CHEMO INJECTION 440 MG
6.0000 mg/kg | Freq: Once | INTRAVENOUS | Status: AC
  Administered 2013-02-25: 504 mg via INTRAVENOUS
  Filled 2013-02-25: qty 24

## 2013-02-25 MED ORDER — HEPARIN SOD (PORK) LOCK FLUSH 100 UNIT/ML IV SOLN
500.0000 [IU] | Freq: Once | INTRAVENOUS | Status: AC | PRN
  Administered 2013-02-25: 500 [IU]
  Filled 2013-02-25: qty 5

## 2013-02-25 MED ORDER — SODIUM CHLORIDE 0.9 % IV SOLN
Freq: Once | INTRAVENOUS | Status: AC
  Administered 2013-02-25: 12:00:00 via INTRAVENOUS

## 2013-02-25 MED ORDER — LIDOCAINE-PRILOCAINE 2.5-2.5 % EX CREA
TOPICAL_CREAM | CUTANEOUS | Status: AC
  Filled 2013-02-25: qty 5

## 2013-02-25 MED ORDER — SODIUM CHLORIDE 0.9 % IJ SOLN
10.0000 mL | INTRAMUSCULAR | Status: DC | PRN
Start: 2013-02-25 — End: 2013-02-25
  Administered 2013-02-25: 10 mL
  Filled 2013-02-25: qty 10

## 2013-02-25 NOTE — Progress Notes (Signed)
OFFICE PROGRESS NOTE  CC  [facility], MD [address] [facility] [place] [address] Dr. Cyndia Bent Dr. Chipper Herb  DIAGNOSIS: 77 year old female with new diagnosis of invasive ductal carcinoma that is ER positive PR positive HER-2/neu positive by [facility] Testing  PRIOR THERAPY:  #1 patient was originally seenBy me on 02/21/2012 when she was diagnosed with invasive ductal carcinoma of the right breast stage II a with a positive lymph node. She underwent a lumpectomy and sentinel lymph node biopsy.The final pathology revealed a 1.8 cm invasive ductal carcinoma with low grade ductal carcinoma in situ the tumor was grade 2 ER positive PR positive Ki-67 was 11%. HER-2/neu was equivocal by cish.  #2 she had a sentinel lymph node biopsy one node was positive for metastatic disease. Making her a stage II A.  #3 patient and I and her family had an extensive discussion at the original meeting and we sent her tumor for HER-2/neu testing by [facility]. By her[facility]  HER-2/neu is positive.  #4 Patient received combination chemotherapy consisting of Taxotere carboplatinum and Herceptin Beginning 03/30/2012 x 6 cycles.    #5 Patient underwent adjuvant radiation therapy starting 08/23/12 through 10/02/12.    #6 Every 3 week herceptin began 08/17/12.  Letrozole therapy started in July 2014 this was stopped in 10/2012, and she was started on Arimidex in 11/2012 and is tolerating it well.      CURRENT THERAPY: every 3 week Herceptin, daily Arimidex  INTERVAL HISTORY: Heather Vega 77 y.o. female is doing well today.  She is tolerating the herceptin IV and po arimidex well.  She last had her appointment with Dr. Shirlee Latch in cardiology where it was unclear if there was increased heart strain.  She is set to f/u with him next week.  She denies any lower extremity swelling, orthopnea, or further concerns.     MEDICAL HISTORY: Past Medical History  Diagnosis Date  .  Hyperlipidemia     doesn't require meds  . Asthma     pt states its "not bad"  . Back pain     buldging disc and pt says 2 are "flat"  . Hemorrhoids   . History of colon polyps   . Diverticulosis   . Breast cancer     breast right  . Hard of hearing   . Diabetes mellitus without complication     takes Metformin daily,Type II  . Allergy   . Cataract     b/l surgery  . Hypothyroidism     hx of;but has been off of meds >65yrs ago  . Wears glasses   . Hypertension     history, was on b/p  meds years ago  . PONV (postoperative nausea and vomiting)   . History of chemotherapy 03/30/2012-07/20/2012    6 cycles of taxotere, carboplatinum, herceptin    ALLERGIES:  has No Known Allergies.  MEDICATIONS:  Current Outpatient Prescriptions  Medication Sig Dispense Refill  . anastrozole (ARIMIDEX) 1 MG tablet Take 1 tablet (1 mg total) by mouth daily.  30 tablet  3  . aspirin 81 MG tablet Take 81 mg by mouth daily.      . B Complex-C (SUPER B COMPLEX PO) Take 1 capsule by mouth daily.      . carvedilol (COREG) 6.25 MG tablet Take 1 tablet (6.25 mg total) by mouth 2 (two) times daily with a meal.  60 tablet  3  . CINNAMON PO Take 1 tablet by mouth  2 (two) times daily.       Marland Kitchen lidocaine-prilocaine (EMLA) cream Apply topically as needed.  30 g  7  . metFORMIN (GLUCOPHAGE) 1000 MG tablet Take 1,000 mg by mouth 2 (two) times daily with a meal.      . Multiple Vitamins-Minerals (MULTIVITAMIN WITH MINERALS) tablet Take 1 tablet by mouth daily.      . naproxen sodium (ANAPROX) 220 MG tablet Take 220 mg by mouth as needed.      . Vitamins C E (CRANBERRY CONCENTRATE PO) Take 1 tablet by mouth 2 (two) times daily.        No current facility-administered medications for this visit.    SURGICAL HISTORY:  Past Surgical History  Procedure Laterality Date  . Nose surgery  1953  . Tonsillectomy  1955  . Nasal fracture surgery      as a child x 2  . Tonsillectomy    . Breast surgery  70's    right   . Appendectomy    . Bilateral cataract surgery    . Colonoscopy    . Neuroplasty / transposition median nerve at carpal tunnel bilateral    . Cardiac catheterization  20+yrs ago  . Breast lumpectomy with needle localization and axillary sentinel lymph node bx  02/07/2012    Procedure: BREAST LUMPECTOMY WITH NEEDLE LOCALIZATION AND AXILLARY SENTINEL LYMPH NODE BX;  Surgeon: Currie Paris, MD;  Location: [facility] OR;  Service: General;  Laterality: Right;  Right needle loc lumpectomy and Senitel Lymph Node Biopsy   . Abdominal hysterectomy      ovaries intact  . Portacath placement  03/26/2012    Procedure: INSERTION PORT-A-CATH;  Surgeon: Currie Paris, MD;  Location: [facility];  Service: General;  Laterality: Left;    REVIEW OF SYSTEMS:   A 10 point review of systems was conducted and is otherwise negative except for what is noted above.     PHYSICAL EXAMINATION: Blood pressure 135/78, pulse 81, temperature 98.4 F (36.9 C), temperature source Oral, resp. rate 18, height 5\' 5"  (1.651 m), weight 182 lb 9.6 oz (82.827 kg). Body mass index is 30.39 kg/(m^2). General: Patient is a well appearing female in no acute distress HEENT: PERRLA, sclerae anicteric no conjunctival pallor, MMM Neck: supple, no lymphadenopathy Lungs: clear to auscultation bilaterally, no wheezes, rhonchi, or rales Cardiovascular: regular rate rhythm, S1, S2, no murmurs, rubs or gallops Abdomen: Soft, non-tender, non-distended, normoactive bowel sounds, no HSM Extremities: warm and well perfused, no clubbing, cyanosis, or edema Skin: No rashes or lesions Neuro: Non-focal ECOG PERFORMANCE STATUS: 0 - Asymptomatic  LABORATORY DATA: Lab Results  Component Value Date   WBC 6.6 02/25/2013   HGB 13.0 02/25/2013   HCT 38.6 02/25/2013   MCV 89.8 02/25/2013   PLT 220 02/25/2013      Chemistry      Component Value Date/Time   NA 141 01/31/2013 1020   NA 138 01/31/2012 1030   K 4.4 01/31/2013 1020    K 4.9 01/31/2012 1030   CL 102 09/07/2012 1137   CL 98 01/31/2012 1030   CO2 25 01/31/2013 1020   CO2 28 01/31/2012 1030   BUN 17.7 01/31/2013 1020   BUN 18 01/31/2012 1030   CREATININE 0.7 01/31/2013 1020   CREATININE 0.78 01/31/2012 1030      Component Value Date/Time   CALCIUM 9.5 01/31/2013 1020   CALCIUM 9.8 01/31/2012 1030   ALKPHOS 64 01/31/2013 1020   ALKPHOS 75 01/31/2012 1030  AST 22 01/31/2013 1020   AST 32 01/31/2012 1030   ALT 25 01/31/2013 1020   ALT 23 01/31/2012 1030   BILITOT 0.41 01/31/2013 1020   BILITOT 0.4 01/31/2012 1030       RADIOGRAPHIC STUDIES:  No results found.  ASSESSMENT: 77 year old female with  #1 stage II A. (T1 C. N1 a  cM0) invasive ductal carcinoma Measuring 1.8 cm ER positive PR positive HER-2/neu positive. One sentinel node was positive for metastatic disease.  #2 patient was a good candidate for adjuvant HER-2-based therapy. Patient underwent treatment with Taxotere carboplatinum and Herceptin. The Taxotere carboplatinum was given every 3 weeks with day 2 Neulasta for a total of 6 cycles. The Herceptin was be given weekly for the duration of the chemotherapy and she is now receiving every 3 week herceptin to complete out a year of Herceptin therapy.    #3 Patient has completed radiation therapy with Dr. Dayton Scrape and was started on Letrozole afterward in 09/2012.  She unfortunately was unable to tolerate the Letrozole and we started Arimidex which she is tolerating well.    PLAN:    #1 Doing well, no sign of recurrence. Proceed with Herceptin, continue daily Arimidex.  She is due to follow up with Dr. Shirlee Latch this week.  I will f/u with her over the phone with her holiday plans and Herceptin appointments.    All questions were answered. The patient knows to call the clinic with any problems, questions or concerns. We can certainly see the patient much sooner if necessary.  I spent 15 minutes counseling the patient face to face. The total time spent in the  appointment was 30 minutes.  Heather Level, NP Medical Oncology [facility] [phone]

## 2013-02-25 NOTE — Patient Instructions (Signed)
[facility] Discharge Instructions for Patients Receiving Chemotherapy  Today you received the following chemotherapy agents: herceptin  To help prevent nausea and vomiting after your treatment, we encourage you to take your nausea medication.  Take it as often as prescribed.     If you develop nausea and vomiting that is not controlled by your nausea medication, call the clinic. If it is after clinic hours your family physician or the after hours number for the clinic or go to the Emergency Department.   BELOW ARE SYMPTOMS THAT SHOULD BE REPORTED IMMEDIATELY:  *FEVER GREATER THAN 100.5 F  *CHILLS WITH OR WITHOUT FEVER  NAUSEA AND VOMITING THAT IS NOT CONTROLLED WITH YOUR NAUSEA MEDICATION  *UNUSUAL SHORTNESS OF BREATH  *UNUSUAL BRUISING OR BLEEDING  TENDERNESS IN MOUTH AND THROAT WITH OR WITHOUT PRESENCE OF ULCERS  *URINARY PROBLEMS  *BOWEL PROBLEMS  UNUSUAL RASH Items with * indicate a potential emergency and should be followed up as soon as possible.  Feel free to call the clinic you have any questions or concerns. The clinic phone number is [phone].   I have been informed and understand all the instructions given to me. I know to contact the clinic, my physician, or go to the Emergency Department if any problems should occur. I do not have any questions at this time, but understand that I may call the clinic during office hours   should I have any questions or need assistance in obtaining follow up care.    __________________________________________  _____________  __________ Signature of Patient or Authorized Representative            Date                   Time    __________________________________________ Nurse's Signature    

## 2013-02-25 NOTE — Progress Notes (Signed)
Pt refused tylenol and benadryl. 

## 2013-02-26 ENCOUNTER — Other Ambulatory Visit: Payer: Self-pay | Admitting: *Deleted

## 2013-02-26 DIAGNOSIS — C50911 Malignant neoplasm of unspecified site of right female breast: Secondary | ICD-10-CM

## 2013-02-26 MED ORDER — ANASTROZOLE 1 MG PO TABS: 1.0000 mg | ORAL_TABLET | Freq: Every day | ORAL | Status: DC

## 2013-02-27 ENCOUNTER — Ambulatory Visit (HOSPITAL_COMMUNITY): Payer: Medicare Other | Attending: Cardiology | Admitting: Radiology

## 2013-02-27 VITALS — BP 181/94 | HR 68 | Ht 65.0 in | Wt 180.0 lb

## 2013-02-27 DIAGNOSIS — Z8249 Family history of ischemic heart disease and other diseases of the circulatory system: Secondary | ICD-10-CM | POA: Insufficient documentation

## 2013-02-27 DIAGNOSIS — I10 Essential (primary) hypertension: Secondary | ICD-10-CM | POA: Insufficient documentation

## 2013-02-27 DIAGNOSIS — E119 Type 2 diabetes mellitus without complications: Secondary | ICD-10-CM | POA: Insufficient documentation

## 2013-02-27 DIAGNOSIS — Z87891 Personal history of nicotine dependence: Secondary | ICD-10-CM | POA: Insufficient documentation

## 2013-02-27 DIAGNOSIS — E785 Hyperlipidemia, unspecified: Secondary | ICD-10-CM | POA: Insufficient documentation

## 2013-02-27 DIAGNOSIS — R079 Chest pain, unspecified: Secondary | ICD-10-CM | POA: Insufficient documentation

## 2013-02-27 MED ORDER — TECHNETIUM TC 99M SESTAMIBI GENERIC - CARDIOLITE
11.0000 | Freq: Once | INTRAVENOUS | Status: AC | PRN
  Administered 2013-02-27: 11 via INTRAVENOUS

## 2013-02-27 MED ORDER — TECHNETIUM TC 99M SESTAMIBI GENERIC - CARDIOLITE
33.0000 | Freq: Once | INTRAVENOUS | Status: AC | PRN
  Administered 2013-02-27: 33 via INTRAVENOUS

## 2013-02-27 NOTE — Progress Notes (Addendum)
[facility] SITE 3 NUCLEAR MED [address] [place], [place] [address] [phone]    Cardiology Nuclear Med Study  Heather Vega is a 77 y.o. female     MRN : [id number]     DOB: Sep 21, 1935  Procedure Date: 02/27/2013  Nuclear Med Background Indication for Stress Test:  Evaluation for Ischemia History:  No known CAD, Cath 1996 (normal), Echo 2014 EF 55%, MPI ~2 yrs. ago (? results) Cardiac Risk Factors: Family History - CAD, History of Smoking, Hypertension, Lipids and NIDDM  Symptoms:  Chest Pain   Nuclear Pre-Procedure Caffeine/Decaff Intake:  None > 12 hrs NPO After: 7:00am   Lungs:  clear O2 Sat: 97% on room air. IV 0.9% NS with Angio Cath:  22g  IV Site: L Antecubital x 1, tolerated well IV Started by:  Irean Hong, RN  Chest Size (in):  36 Cup Size: A  Height: 5\' 5"  (1.651 m)  Weight:  180 lb (81.647 kg)  BMI:  Body mass index is 29.95 kg/(m^2). Tech Comments:  Took coreg at [employer] Med Study 1 or 2 day study: 1 day  Stress Test Type:  Stress  Reading MD: Cassell Clement, MD  Order Authorizing Provider:  Marca Ancona, MD  Resting Radionuclide: Technetium 64m Sestamibi  Resting Radionuclide Dose: 11.0 mCi   Stress Radionuclide:  Technetium 66m Sestamibi  Stress Radionuclide Dose: 33.0 mCi           Stress Protocol Rest HR: 68 Stress HR: 131  Rest BP: 181/94 Stress BP: 168/78  Exercise Time (min): 4:15 METS: 6.1   Predicted Max HR: 145 bpm % Max HR: 90.34 bpm Rate Pressure Product: 21308   Dose of Adenosine (mg):  n/a Dose of Lexiscan: n/a mg  Dose of Atropine (mg): n/a Dose of Dobutamine: n/a mcg/kg/min (at max HR)  Stress Test Technologist: Nelson Chimes, BS-ES  Nuclear Technologist:  Dario Guardian, CNMT     Rest Procedure:  Myocardial perfusion imaging was performed at rest 45 minutes following the intravenous administration of Technetium 27m Sestamibi. Rest ECG: NSR - Normal EKG  Stress Procedure:  The patient  exercised on the treadmill utilizing the Bruce Protocol for 4:15 minutes. The patient stopped due to SOB, leg fatigue and denied any chest pain.  Technetium 64m Sestamibi was injected at peak exercise and myocardial perfusion imaging was performed after a brief delay.  Recovery uneventful. Symptoms resolved.  Stress ECG: No significant change from baseline ECG  QPS Raw Data Images:  Normal; no motion artifact; normal heart/lung ratio. Stress Images:  Normal homogeneous uptake in all areas of the myocardium. Rest Images:  Normal homogeneous uptake in all areas of the myocardium. Subtraction (SDS):  No evidence of ischemia. Transient Ischemic Dilatation (Normal <1.22):  0.92 Lung/Heart Ratio (Normal <0.45):  0.34  Quantitative Gated Spect Images QGS EDV:  85 ml QGS ESV:  41 ml  Impression Exercise Capacity:  Fair exercise capacity. BP Response:  Normal blood pressure response. Clinical Symptoms:  There is dyspnea. ECG Impression:  No significant ST segment change suggestive of ischemia. Comparison with Prior Nuclear Study: No images to compare  Overall Impression:  Normal stress nuclear study.  LV Ejection Fraction: 52%.  LV Wall Motion:  NL LV Function; NL Wall Motion  Thomas Brackbill  EF 52%, no ischemia or infarction.   Marca Ancona 03/01/2013 8:34 AM

## 2013-03-01 NOTE — Progress Notes (Signed)
Pt aware.

## 2013-03-06 ENCOUNTER — Encounter (HOSPITAL_COMMUNITY): Payer: Self-pay

## 2013-03-06 ENCOUNTER — Ambulatory Visit (HOSPITAL_BASED_OUTPATIENT_CLINIC_OR_DEPARTMENT_OTHER)
Admission: RE | Admit: 2013-03-06 | Discharge: 2013-03-06 | Disposition: A | Discharge status: Home / Self Care | Payer: Medicare Other | Source: Ambulatory Visit | Attending: Internal Medicine | Admitting: Internal Medicine

## 2013-03-06 ENCOUNTER — Ambulatory Visit (HOSPITAL_COMMUNITY)
Admission: RE | Admit: 2013-03-06 | Discharge: 2013-03-06 | Disposition: A | Discharge status: Home / Self Care | Payer: Medicare Other | Source: Ambulatory Visit | Attending: Cardiology | Admitting: Cardiology

## 2013-03-06 VITALS — BP 140/86 | HR 82 | Wt 182.8 lb

## 2013-03-06 DIAGNOSIS — C50919 Malignant neoplasm of unspecified site of unspecified female breast: Secondary | ICD-10-CM

## 2013-03-06 DIAGNOSIS — Z09 Encounter for follow-up examination after completed treatment for conditions other than malignant neoplasm: Secondary | ICD-10-CM

## 2013-03-06 DIAGNOSIS — I10 Essential (primary) hypertension: Secondary | ICD-10-CM | POA: Insufficient documentation

## 2013-03-06 DIAGNOSIS — C50911 Malignant neoplasm of unspecified site of right female breast: Secondary | ICD-10-CM

## 2013-03-06 DIAGNOSIS — E119 Type 2 diabetes mellitus without complications: Secondary | ICD-10-CM | POA: Insufficient documentation

## 2013-03-06 NOTE — Progress Notes (Signed)
Patient ID: Heather Vega, female   DOB: Dec 11, 1935, 77 y.o.   MRN: [id number] Oncologist: Dr Welton Flakes General Surgeon: Dr Jamey Ripa Radiation Oncologist: Dr Dayton Scrape PCP: Dr Sharlot Gowda  HPI: Heather Vega is a 77 year old female with new diagnosis of invasive ductal carcinoma that is ER positive PR positive HER-2/neu positive, hypothyroid (not on medicaitons), DM II, and HOH.   02/21/2012 when she was diagnosed with invasive ductal carcinoma of the right breast stage II a with a positive lymph node. She underwent a lumpectomy and sentinel lymph node biopsy.The final pathology revealed a 1.8 cm invasive ductal carcinoma with low grade ductal carcinoma in situ the tumor was grade 2 ER positive PR positive Ki-67 was 11%. HER-2/neu was equivocal. Plan for Taxotere carboplatinum and Herceptin. Taxotere carboplatinum will be given every 3 weeks with day 2 Neulasta for a total of 6 cycles. The Herceptin will be given weekly for the duration of the chemotherapy and then changed to every 3 weeks to complete one year of Herceptin therapy. Plans to complete Herceptin in January 2015.   No known heart disease. Had cath in 1996 in [place], [place]. 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 Clean cors. Normal EF. Says she has white coat HTN  ECHO  03/29/12 EF 60-65% lateral S' 8.3 severe LVH 07/16/12 EF 60% lat s' 9.7 grade 1 diastolic dysfunction  10/23/12 EF 55-60% lateral s' 9.4 global strain -16.6 01/30/13 EF 55% Lateral S' 7.5 global strain -15.7, mild MR, normal RV, 4.3 cm ascending aorta - poor windows 03/06/13 EF 55%, lateral s' 9.5, global strain -14.9  Follow up: Last visit EF stable, but lateral s' dropped. Herceptin was not stopped because it was thought there was probably not significant difference as tissue doppler imaging was less extensive. Since last visit had cardiolite stress test which was nl. Denies SOB, Orthopnea or CP. + palpitations that she notices when lying down. No exertional chest  pain/tightness.  She continues on Herceptin every 3 weeks. SBP at home 120-130/60-70s.  Review of Systems: All pertinent positives and negatives as in HPI, otherwise negative    Past Medical History  Diagnosis Date  . Hyperlipidemia     doesn't require meds  . Asthma     pt states its "not bad"  . Back pain     buldging disc and pt says 2 are "flat"  . Hemorrhoids   . History of colon polyps   . Diverticulosis   . Breast cancer     breast right  . Hard of hearing   . Diabetes mellitus without complication     takes Metformin daily,Type II  . Allergy   . Cataract     b/l surgery  . Hypothyroidism     hx of;but has been off of meds >34yrs ago  . Wears glasses   . Hypertension     history, was on b/p  meds years ago  . PONV (postoperative nausea and vomiting)   . History of chemotherapy 03/30/2012-07/20/2012    6 cycles of taxotere, carboplatinum, herceptin    Current Outpatient Prescriptions  Medication Sig Dispense Refill  . anastrozole (ARIMIDEX) 1 MG tablet Take 1 tablet (1 mg total) by mouth daily.  30 tablet  6  . aspirin 81 MG tablet Take 81 mg by mouth daily.      . B Complex-C (SUPER B COMPLEX PO) Take 1 capsule by mouth daily.      . carvedilol (COREG) 6.25 MG tablet Take 1 tablet (6.25  mg total) by mouth 2 (two) times daily with a meal.  60 tablet  3  . CINNAMON PO Take 1 tablet by mouth 2 (two) times daily.       Marland Kitchen lidocaine-prilocaine (EMLA) cream Apply topically as needed.  30 g  7  . metFORMIN (GLUCOPHAGE) 1000 MG tablet Take 1,000 mg by mouth 2 (two) times daily with a meal.      . Multiple Vitamins-Minerals (MULTIVITAMIN WITH MINERALS) tablet Take 1 tablet by mouth daily.      . naproxen sodium (ANAPROX) 220 MG tablet Take 220 mg by mouth as needed.      . Vitamins C E (CRANBERRY CONCENTRATE PO) Take 1 tablet by mouth 2 (two) times daily.        No current facility-administered medications for this encounter.     No Known Allergies  Filed Vitals:    03/06/13 1333  BP: 140/86  Pulse: 82  Weight: 182 lb 12.8 oz (82.918 kg)  SpO2: 98%   PHYSICAL EXAM: General:  elderly. No respiratory difficulty HEENT: normal Neck: supple. no JVD. Carotids 2+ bilat; no bruits. No lymphadenopathy or thryomegaly appreciated. Cor: PMI nondisplaced. Regular rate & rhythm. No rubs, gallops or murmurs. Lungs: clear Abdomen: soft, nontender, nondistended. No hepatosplenomegaly. No bruits or masses. Good bowel sounds. Extremities: no cyanosis, clubbing, rash, tr edema Neuro: alert & oriented x 3, cranial nerves grossly intact except for reduced hearing. moves all 4 extremities w/o difficulty. Affect pleasant     ASSESSMENT & PLAN: 1) Breast CA -HER-2/neu positive. Remains on Herceptin every 3 weeks with estimated completion date January 2015.  - ECHO reviewed by Dr. Gala Romney and EF, lateral s' and global strain stable. Believe that previous ECHO that lateral s' was underestimated d/t poor tissue doppler imaging. - continue with herceptin, complete in 03/2013. - Follow up Feb with ECHO.  2) Chest Pain - Had cardiolite 03/01/13; nl stress test. LV EF 52%, no ischemia or infarction.  3) HTN- stable. Continue carvedilol 6.25 mg twice a day  Ulla Potash B NP-C  1:48 PM  Patient seen and examined with Ulla Potash, NP. We discussed all aspects of the encounter. I agree with the assessment and plan as stated above.   I reviewed echos personally. EF and Doppler parameters stable. No HF on exam. Continue Herceptin.   Kahdijah Errickson,MD 3:06 PM

## 2013-03-06 NOTE — Patient Instructions (Signed)
Doing great.  Have a wonderful Holiday Season.  Call any issues.  F/U late January or early Feb with ECHO.

## 2013-03-06 NOTE — Progress Notes (Signed)
  Echocardiogram 2D Echocardiogram has been performed.  Georgian Co 03/06/2013, 12:45 PM

## 2013-03-18 ENCOUNTER — Ambulatory Visit (HOSPITAL_BASED_OUTPATIENT_CLINIC_OR_DEPARTMENT_OTHER): Payer: Medicare Other

## 2013-03-18 ENCOUNTER — Telehealth: Payer: Self-pay | Admitting: *Deleted

## 2013-03-18 ENCOUNTER — Other Ambulatory Visit (HOSPITAL_BASED_OUTPATIENT_CLINIC_OR_DEPARTMENT_OTHER): Payer: Medicare Other

## 2013-03-18 ENCOUNTER — Telehealth: Payer: Self-pay | Admitting: Oncology

## 2013-03-18 VITALS — BP 158/97 | HR 68 | Temp 98.9°F | Resp 18

## 2013-03-18 DIAGNOSIS — C50911 Malignant neoplasm of unspecified site of right female breast: Secondary | ICD-10-CM

## 2013-03-18 DIAGNOSIS — C50419 Malignant neoplasm of upper-outer quadrant of unspecified female breast: Secondary | ICD-10-CM

## 2013-03-18 DIAGNOSIS — Z5112 Encounter for antineoplastic immunotherapy: Secondary | ICD-10-CM

## 2013-03-18 LAB — CBC WITH DIFFERENTIAL/PLATELET
Basophils Absolute: 0 10*3/uL (ref 0.0–0.1)
EOS%: 3.6 % (ref 0.0–7.0)
Eosinophils Absolute: 0.2 10*3/uL (ref 0.0–0.5)
HCT: 37.6 % (ref 34.8–46.6)
HGB: 12.8 g/dL (ref 11.6–15.9)
LYMPH%: 22.1 % (ref 14.0–49.7)
MCH: 30.3 pg (ref 25.1–34.0)
MCV: 89.1 fL (ref 79.5–101.0)
MONO#: 0.4 10*3/uL (ref 0.1–0.9)
NEUT#: 3.5 10*3/uL (ref 1.5–6.5)
RDW: 13.9 % (ref 11.2–14.5)
WBC: 5.3 10*3/uL (ref 3.9–10.3)
lymph#: 1.2 10*3/uL (ref 0.9–3.3)

## 2013-03-18 LAB — COMPREHENSIVE METABOLIC PANEL (CC13)
Albumin: 3.7 g/dL (ref 3.5–5.0)
Alkaline Phosphatase: 62 U/L (ref 40–150)
BUN: 25.9 mg/dL (ref 7.0–26.0)
CO2: 26 mEq/L (ref 22–29)
Calcium: 9.3 mg/dL (ref 8.4–10.4)
Chloride: 104 mEq/L (ref 98–109)
Creatinine: 0.8 mg/dL (ref 0.6–1.1)
Glucose: 109 mg/dl (ref 70–140)
Potassium: 4 mEq/L (ref 3.5–5.1)

## 2013-03-18 MED ORDER — SODIUM CHLORIDE 0.9 % IJ SOLN
10.0000 mL | INTRAMUSCULAR | Status: DC | PRN
  Administered 2013-03-18: 10 mL
  Filled 2013-03-18: qty 10

## 2013-03-18 MED ORDER — SODIUM CHLORIDE 0.9 % IV SOLN
Freq: Once | INTRAVENOUS | Status: AC
  Administered 2013-03-18: 11:00:00 via INTRAVENOUS

## 2013-03-18 MED ORDER — TRASTUZUMAB CHEMO INJECTION 440 MG
6.0000 mg/kg | Freq: Once | INTRAVENOUS | Status: AC
  Administered 2013-03-18: 504 mg via INTRAVENOUS
  Filled 2013-03-18: qty 24

## 2013-03-18 MED ORDER — HEPARIN SOD (PORK) LOCK FLUSH 100 UNIT/ML IV SOLN
500.0000 [IU] | Freq: Once | INTRAVENOUS | Status: AC | PRN
  Administered 2013-03-18: 500 [IU]
  Filled 2013-03-18: qty 5

## 2013-03-18 NOTE — Telephone Encounter (Signed)
Per staff message and POF I have scheduled appts.  JMW  

## 2013-03-18 NOTE — Patient Instructions (Signed)

## 2013-03-18 NOTE — Progress Notes (Signed)
Pt refused tylenol and benadryl. 

## 2013-04-08 ENCOUNTER — Encounter: Payer: Self-pay | Admitting: *Deleted

## 2013-04-08 ENCOUNTER — Other Ambulatory Visit (HOSPITAL_BASED_OUTPATIENT_CLINIC_OR_DEPARTMENT_OTHER): Payer: Medicare Other

## 2013-04-08 ENCOUNTER — Other Ambulatory Visit: Payer: Medicare Other

## 2013-04-08 ENCOUNTER — Ambulatory Visit (HOSPITAL_BASED_OUTPATIENT_CLINIC_OR_DEPARTMENT_OTHER): Payer: Medicare Other

## 2013-04-08 ENCOUNTER — Ambulatory Visit (HOSPITAL_BASED_OUTPATIENT_CLINIC_OR_DEPARTMENT_OTHER): Payer: Medicare Other | Admitting: Oncology

## 2013-04-08 ENCOUNTER — Encounter: Payer: Self-pay | Admitting: Oncology

## 2013-04-08 VITALS — BP 168/82 | HR 79 | Temp 98.3°F | Resp 18 | Ht 65.0 in | Wt 178.1 lb

## 2013-04-08 DIAGNOSIS — R52 Pain, unspecified: Secondary | ICD-10-CM

## 2013-04-08 DIAGNOSIS — F3289 Other specified depressive episodes: Secondary | ICD-10-CM

## 2013-04-08 DIAGNOSIS — C50419 Malignant neoplasm of upper-outer quadrant of unspecified female breast: Secondary | ICD-10-CM

## 2013-04-08 DIAGNOSIS — C50911 Malignant neoplasm of unspecified site of right female breast: Secondary | ICD-10-CM

## 2013-04-08 DIAGNOSIS — E119 Type 2 diabetes mellitus without complications: Secondary | ICD-10-CM

## 2013-04-08 DIAGNOSIS — I10 Essential (primary) hypertension: Secondary | ICD-10-CM

## 2013-04-08 DIAGNOSIS — F329 Major depressive disorder, single episode, unspecified: Secondary | ICD-10-CM

## 2013-04-08 DIAGNOSIS — C773 Secondary and unspecified malignant neoplasm of axilla and upper limb lymph nodes: Secondary | ICD-10-CM

## 2013-04-08 DIAGNOSIS — F411 Generalized anxiety disorder: Secondary | ICD-10-CM

## 2013-04-08 DIAGNOSIS — Z5112 Encounter for antineoplastic immunotherapy: Secondary | ICD-10-CM

## 2013-04-08 LAB — COMPREHENSIVE METABOLIC PANEL (CC13)
ALT: 29 U/L (ref 0–55)
AST: 23 U/L (ref 5–34)
Albumin: 4 g/dL (ref 3.5–5.0)
Alkaline Phosphatase: 74 U/L (ref 40–150)
Anion Gap: 12 mEq/L — ABNORMAL HIGH (ref 3–11)
BUN: 18.2 mg/dL (ref 7.0–26.0)
CALCIUM: 9.8 mg/dL (ref 8.4–10.4)
CHLORIDE: 103 meq/L (ref 98–109)
CO2: 27 meq/L (ref 22–29)
CREATININE: 0.9 mg/dL (ref 0.6–1.1)
GLUCOSE: 110 mg/dL (ref 70–140)
Potassium: 4.7 mEq/L (ref 3.5–5.1)
Sodium: 142 mEq/L (ref 136–145)
Total Bilirubin: 0.43 mg/dL (ref 0.20–1.20)
Total Protein: 6.7 g/dL (ref 6.4–8.3)

## 2013-04-08 LAB — CBC WITH DIFFERENTIAL/PLATELET
BASO%: 0.5 % (ref 0.0–2.0)
BASOS ABS: 0 10*3/uL (ref 0.0–0.1)
EOS%: 2.8 % (ref 0.0–7.0)
Eosinophils Absolute: 0.2 10*3/uL (ref 0.0–0.5)
HEMATOCRIT: 39.3 % (ref 34.8–46.6)
HEMOGLOBIN: 13.3 g/dL (ref 11.6–15.9)
LYMPH%: 21 % (ref 14.0–49.7)
MCH: 30.8 pg (ref 25.1–34.0)
MCHC: 33.8 g/dL (ref 31.5–36.0)
MCV: 90.9 fL (ref 79.5–101.0)
MONO#: 0.5 10*3/uL (ref 0.1–0.9)
MONO%: 7.4 % (ref 0.0–14.0)
NEUT#: 4.1 10*3/uL (ref 1.5–6.5)
NEUT%: 68.3 % (ref 38.4–76.8)
PLATELETS: 236 10*3/uL (ref 145–400)
RBC: 4.33 10*6/uL (ref 3.70–5.45)
RDW: 13.8 % (ref 11.2–14.5)
WBC: 6.1 10*3/uL (ref 3.9–10.3)
lymph#: 1.3 10*3/uL (ref 0.9–3.3)

## 2013-04-08 MED ORDER — SODIUM CHLORIDE 0.9 % IJ SOLN
10.0000 mL | INTRAMUSCULAR | Status: DC | PRN
  Administered 2013-04-08: 10 mL
  Filled 2013-04-08: qty 10

## 2013-04-08 MED ORDER — TRASTUZUMAB CHEMO INJECTION 440 MG
6.0000 mg/kg | Freq: Once | INTRAVENOUS | Status: AC
  Administered 2013-04-08: 504 mg via INTRAVENOUS
  Filled 2013-04-08: qty 24

## 2013-04-08 MED ORDER — SODIUM CHLORIDE 0.9 % IV SOLN
Freq: Once | INTRAVENOUS | Status: AC
  Administered 2013-04-08: 15:00:00 via INTRAVENOUS

## 2013-04-08 MED ORDER — HEPARIN SOD (PORK) LOCK FLUSH 100 UNIT/ML IV SOLN
500.0000 [IU] | Freq: Once | INTRAVENOUS | Status: AC | PRN
  Administered 2013-04-08: 500 [IU]
  Filled 2013-04-08: qty 5

## 2013-04-08 MED ORDER — VENLAFAXINE HCL ER 37.5 MG PO CP24: 37.5000 mg | ORAL_CAPSULE | Freq: Every day | ORAL | Status: DC

## 2013-04-08 NOTE — Progress Notes (Signed)
OFFICE PROGRESS NOTE  CC  [facility], MD [address] [facility] [place] [address] Dr. Neldon Mc Dr. Arloa Koh  DIAGNOSIS: 78 year old female with new diagnosis of invasive ductal carcinoma that is ER positive PR positive HER-2/neu positive by [facility] Testing  PRIOR THERAPY:  #1 patient was originally seenBy me on 02/21/2012 when she was diagnosed with invasive ductal carcinoma of the right breast stage II a with a positive lymph node. She underwent a lumpectomy and sentinel lymph node biopsy.The final pathology revealed a 1.8 cm invasive ductal carcinoma with low grade ductal carcinoma in situ the tumor was grade 2 ER positive PR positive Ki-67 was 11%. HER-2/neu was equivocal by cish.  #2 she had a sentinel lymph node biopsy one node was positive for metastatic disease. Making her a stage II A.  #3 tumor was sent forHER-2/neu testing by [facility]. By [facility]  HER-2/neu is positive.  #4 Patient received combination chemotherapy consisting of Taxotere carboplatinum and Herceptin Beginning 03/30/2012- 08/17/2012 x 6 cycles.    #5 Patient underwent adjuvant radiation therapy starting 08/23/12 through 10/02/12.    #6 Every 3 week herceptin began 08/17/12 - 04/09/2013  #7.  Letrozole therapy started in July 2014 this was stopped in 10/2012, and she was started on Arimidex in 11/2012 and is tolerating it well.      CURRENT THERAPY:  daily Arimidex  INTERVAL HISTORY: patient is seen in followup today. Overall she's doing well without any significant problems. She denies any fevers chills night sweats headaches shortness of breath chest pains palpitations no myalgias and arthralgias. Patient does have a lot of anxiety in. She is accompanied by her son today. Patient is very depressed and tearful. Her best friend has significant problems with her treatments. Her friend was diagnosed with multiple myeloma she. She is being treated at [facility]. Patient needs  to be on an antidepressant. We've discussed this extensively. Patient also I do believe needs to be in counseling. We discussed this as well. Referral to Dr. Garen Lah will be made. Patient has no peripheral paresthesias. She does develop some aches and pains but they're tolerable now with a remedy Board. Remainder of the 10 Point Review of Systems Is Negative. Heather Vega 78 y.o. female is doing well today.  She is tolerating the herceptin IV and po arimidex well.  She last had her appointment with Dr. Aundra Dubin in cardiology where it was unclear if there was increased heart strain.  She is set to f/u with him next week.  She denies any lower extremity swelling, orthopnea, or further concerns.     MEDICAL HISTORY: Past Medical History  Diagnosis Date  . Hyperlipidemia     doesn't require meds  . Asthma     pt states its "not bad"  . Back pain     buldging disc and pt says 2 are "flat"  . Hemorrhoids   . History of colon polyps   . Diverticulosis   . Breast cancer     breast right  . Hard of hearing   . Diabetes mellitus without complication     takes Metformin daily,Type II  . Allergy   . Cataract     b/l surgery  . Hypothyroidism     hx of;but has been off of meds >55yr ago  . Wears glasses   . Hypertension     history, was on b/p  meds years ago  . PONV (postoperative nausea and vomiting)   .  History of chemotherapy 03/30/2012-07/20/2012    6 cycles of taxotere, carboplatinum, herceptin    ALLERGIES:  has No Known Allergies.  MEDICATIONS:  Current Outpatient Prescriptions  Medication Sig Dispense Refill  . anastrozole (ARIMIDEX) 1 MG tablet Take 1 tablet (1 mg total) by mouth daily.  30 tablet  6  . aspirin 81 MG tablet Take 81 mg by mouth daily.      . B Complex-C (SUPER B COMPLEX PO) Take 1 capsule by mouth daily.      . carvedilol (COREG) 6.25 MG tablet Take 1 tablet (6.25 mg total) by mouth 2 (two) times daily with a meal.  60 tablet  3  . CINNAMON PO Take 1  tablet by mouth 2 (two) times daily.       Marland Kitchen lidocaine-prilocaine (EMLA) cream Apply topically as needed.  30 g  7  . metFORMIN (GLUCOPHAGE) 1000 MG tablet Take 1,000 mg by mouth 2 (two) times daily with a meal.      . Multiple Vitamins-Minerals (MULTIVITAMIN WITH MINERALS) tablet Take 1 tablet by mouth daily.      . naproxen sodium (ANAPROX) 220 MG tablet Take 220 mg by mouth as needed.      . Vitamins C E (CRANBERRY CONCENTRATE PO) Take 1 tablet by mouth 2 (two) times daily.        No current facility-administered medications for this visit.    SURGICAL HISTORY:  Past Surgical History  Procedure Laterality Date  . Nose surgery  1953  . Tonsillectomy  1955  . Nasal fracture surgery      as a child x 2  . Tonsillectomy    . Breast surgery  70's    right  . Appendectomy    . Bilateral cataract surgery    . Colonoscopy    . Neuroplasty / transposition median nerve at carpal tunnel bilateral    . Cardiac catheterization  20+yrs ago  . Breast lumpectomy with needle localization and axillary sentinel lymph node bx  02/07/2012    Procedure: BREAST LUMPECTOMY WITH NEEDLE LOCALIZATION AND AXILLARY SENTINEL LYMPH NODE BX;  Surgeon: Haywood Lasso, MD;  Location: [place];  Service: General;  Laterality: Right;  Right needle loc lumpectomy and Senitel Lymph Node Biopsy   . Abdominal hysterectomy      ovaries intact  . Portacath placement  03/26/2012    Procedure: INSERTION PORT-A-CATH;  Surgeon: Haywood Lasso, MD;  Location: [place];  Service: General;  Laterality: Left;    REVIEW OF SYSTEMS:   A 10 point review of systems was conducted and is otherwise negative except for what is noted above.     PHYSICAL EXAMINATION: Blood pressure 168/82, pulse 79, temperature 98.3 F (36.8 C), temperature source Oral, resp. rate 18, height 5' 5"  (1.651 m), weight 178 lb 1.6 oz (80.786 kg). Body mass index is 29.64 kg/(m^2). General: Patient is a well appearing female in no  acute distress HEENT: PERRLA, sclerae anicteric no conjunctival pallor, MMM Neck: supple, no lymphadenopathy Lungs: clear to auscultation bilaterally, no wheezes, rhonchi, or rales Cardiovascular: regular rate rhythm, S1, S2, no murmurs, rubs or gallops Abdomen: Soft, non-tender, non-distended, normoactive bowel sounds, no HSM Extremities: warm and well perfused, no clubbing, cyanosis, or edema Skin: No rashes or lesions Neuro: Non-focal ECOG PERFORMANCE STATUS: 0 - Asymptomatic  LABORATORY DATA: Lab Results  Component Value Date   WBC 6.1 04/08/2013   HGB 13.3 04/08/2013   HCT 39.3 04/08/2013   MCV 90.9 04/08/2013  PLT 236 04/08/2013      Chemistry      Component Value Date/Time   NA 140 03/18/2013 1009   NA 138 01/31/2012 1030   K 4.0 03/18/2013 1009   K 4.9 01/31/2012 1030   CL 102 09/07/2012 1137   CL 98 01/31/2012 1030   CO2 26 03/18/2013 1009   CO2 28 01/31/2012 1030   BUN 25.9 03/18/2013 1009   BUN 18 01/31/2012 1030   CREATININE 0.8 03/18/2013 1009   CREATININE 0.78 01/31/2012 1030      Component Value Date/Time   CALCIUM 9.3 03/18/2013 1009   CALCIUM 9.8 01/31/2012 1030   ALKPHOS 62 03/18/2013 1009   ALKPHOS 75 01/31/2012 1030   AST 22 03/18/2013 1009   AST 32 01/31/2012 1030   ALT 23 03/18/2013 1009   ALT 23 01/31/2012 1030   BILITOT 0.51 03/18/2013 1009   BILITOT 0.4 01/31/2012 1030       RADIOGRAPHIC STUDIES:  No results found.  ASSESSMENT: 78 year old female with  #1 stage II A. (T1 C. N1 a  cM0) invasive ductal carcinoma Measuring 1.8 cm ER positive PR positive HER-2/neu positive. One sentinel node was positive for metastatic disease.  #2 patient was a good candidate for adjuvant HER-2-based therapy. Patient underwent treatment with Taxotere carboplatinum and Herceptin. The Taxotere carboplatinum was given every 3 weeks with day 2 Neulasta for a total of 6 cycles. The Herceptin was be given weekly for the duration of the chemotherapy . She has now completed  adjuvant single agent Herceptin on 04/08/2013.   #3 Patient has completed radiation therapy with Dr. Valere Dross and was started on Letrozole afterward in 09/2012.  She unfortunately was unable to tolerate the Letrozole and we started Arimidex which she is tolerating well.    #4 depression/anxiety  PLAN:  #1 patient will be referred to Dr. Garen Lah.  #2 patient is counseled regarding antidepressants. I have gone ahead and put her on Effexor 37.5 mg daily.  #3 patient will be seen back in 3-4 months time for followup   All questions were answered. The patient knows to call the clinic with any problems, questions or concerns. We can certainly see the patient much sooner if necessary.  I spent 15 minutes counseling the patient face to face. The total time spent in the appointment was 30 minutes. Marcy Panning, MD Medical/Oncology [facility] [phone] (beeper) [phone] (Office)

## 2013-04-08 NOTE — Patient Instructions (Signed)
[facility] Discharge Instructions for Patients Receiving Chemotherapy  Today you received the following chemotherapy agents: herceptin  To help prevent nausea and vomiting after your treatment, we encourage you to take your nausea medication.  Take it as often as prescribed.     If you develop nausea and vomiting that is not controlled by your nausea medication, call the clinic. If it is after clinic hours your family physician or the after hours number for the clinic or go to the Emergency Department.   BELOW ARE SYMPTOMS THAT SHOULD BE REPORTED IMMEDIATELY:  *FEVER GREATER THAN 100.5 F  *CHILLS WITH OR WITHOUT FEVER  NAUSEA AND VOMITING THAT IS NOT CONTROLLED WITH YOUR NAUSEA MEDICATION  *UNUSUAL SHORTNESS OF BREATH  *UNUSUAL BRUISING OR BLEEDING  TENDERNESS IN MOUTH AND THROAT WITH OR WITHOUT PRESENCE OF ULCERS  *URINARY PROBLEMS  *BOWEL PROBLEMS  UNUSUAL RASH Items with * indicate a potential emergency and should be followed up as soon as possible.  Feel free to call the clinic you have any questions or concerns. The clinic phone number is [phone].   I have been informed and understand all the instructions given to me. I know to contact the clinic, my physician, or go to the Emergency Department if any problems should occur. I do not have any questions at this time, but understand that I may call the clinic during office hours   should I have any questions or need assistance in obtaining follow up care.    __________________________________________  _____________  __________ Signature of Patient or Authorized Representative            Date                   Time    __________________________________________ Nurse's Signature    

## 2013-04-08 NOTE — Patient Instructions (Signed)
Breast Cancer Survivor Follow-Up Breast cancer begins when cells in the breast divide too rapidly. The extra cells form a lump (tumor). When the cancer is treated, the goal is to get rid of all cancer cells. However, sometimes a few cells survive. These cancer cells can then grow. They become recurrent cancer. This means the cancer comes back after treatment.  Most cases of recurrent breast cancer develop 3 to 5 years after treatment. However, sometimes it comes back just a few months after treatment. Other times, it does not come back until years later. If the cancer comes back in the same area as the first breast cancer, it is called a local recurrence. If the cancer comes back somewhere else in the body, it is called regional recurrence if the site is fairly near the breast or distant recurrence if it is far from the breast. Your caregiver may also use the term metastasize to indicate a cancer that has gone to another part of your body. Treatment is still possible after either kind of recurrence. The cancer can still be controlled.  CAUSES OF RECURRENT CANCER No one knows exactly why breast cancer starts in the first place. Why the cancer comes back after treatment is also not clear. It is known that certain conditions, called risk factors, can make this more likely. They include:  Developing breast cancer for the first time before age 60.  Having breast cancer that involves the lymph nodes. These are small, round pieces of tissue found all over the body. Their job is to help fight infections.  Having a large tumor. Cancer is more apt to come back if the first tumor was bigger than 2 inches (5 cm).  Having certain types of breast cancer, such as:  Inflammatory breast cancer. This rare type grows rapidly and causes the breast to become red and swollen.  A high-grade tumor. The grade of a tumor indicates how fast it will grow and spread. High-grade tumors grow more quickly than other types.  HER2  cancer. This refers to the tumor's genetic makeup. Tumors that have this type of gene are more likely to come back after treatment.  Having close tumor margins. This refers to the space between the tumor and normal, noncancerous cells. If the space is small, the tumor has a greater chance of coming back.  Having treatment involving a surgery to remove the tumor but not the entire breast (lumpectomy) and no radiation therapy. CARE AFTER BREAST CANCER Home Monitoring Women who have had breast cancer should continue to examine their breasts every month. The goal is to catch the cancer quickly if it comes back. Many women find it helpful to do so on the same day each month and to mark the calendar as a reminder. Let your caregiver know immediately if you have any signs of recurrent breast cancer. Symptoms will vary, depending on where the cancer recurs. The original type of treatment can also make a difference. Symptoms of local recurrence after a lumpectomy or a recurrence in the opposite breast may include:  A new lump or thickening in the breast.  A change in the way the skin looks on the breast (such as a rash, dimpling, or wrinkling).  Redness or swelling of the breast.  Changes in the nipple (such as being red, puckered, swollen, or leaking fluid). Symptoms of a recurrence after a breast removal surgery (mastectomy) may include:  A lump or thickening under the skin.  A thickening around the mastectomy scar. Symptoms   of regional recurrence in the lymph nodes near the breast may include:  A lump under the arm or above the collarbone.  Swelling of the arm.  Pain in the arm, shoulder, or chest.  Numbness in the hand or arm. Symptoms of distant recurrence may include:  A cough that does not go away.  Trouble breathing or shortness of breath.  Pain in the bones or the chest. This is pain that lasts or does not respond to rest and medicine.  Headaches.  Sudden vision  problems.  Dizziness.  Nausea or vomiting.  Losing weight without trying to.  Persistent abdominal pain.  Changes in bowel movements or blood in the stool.  Yellowing of the skin or eyes (jaundice).  Blood in the urine or bloody vaginal discharge. Clinical Monitoring  It is helpful to keep a schedule of appointments for needed tests and exams. This includes physical exams, breast exams, exams of the lymph nodes, and general exams.  For the first 3 years after being treated for breast cancer, see your caregiver every 3 to 6 months.  For years 4 and 5 after breast cancer, see your caregiver every 6 to 12 months.  After 5 years, see your caregiver at least once a year.  Regular breast X-rays (mammograms) should continue even if you had a mastectomy.  A mammogram should be done 1 year after the mammogram that first detected breast cancer.  A mammogram should be done every 6 to 12 months after that. Follow your caregiver's advice.  A pelvic exam done by your caregiver checks whether female organs are the normal size and shape. The exam is usually done every year. Ask your caregiver if that schedule is right for you.  Women taking tamoxifen should report any vaginal bleeding immediately to their caregiver. Tamoxifen is often given to women with a certain type of breast cancer. It has been shown to help prevent recurrence.  You will need to decide who your primary caregiver will be.  Most people continue to see their cancer specialist (oncologist) every 3 to 6 months for the first year after cancer treatment.  At some point, you may want to go back to seeing your family caregiver. You would no longer see your oncologist for regular checkups. Many women do this about 1 year after their first diagnosis of breast cancer.  You will still need to be seen every so often by your oncologist. Ask how often that should be. Coordinate this with your family or primary caregiver.  Think about  having genetic counseling. This would provide information on traits that can be passed or inherited from one generation to the next. In some cases, breast cancer runs in families. Tell your caregiver if you:  Are of Ashkenazi Jewish heritage.  Have any family member who has had ovarian cancer.  Have a mother, sister, or daughter who had breast cancer before age 7.  Have 2 or more close relatives who have had breast cancer. This means a mother, sister, daughter, aunt, or grandmother.  Had breast cancer in both breasts.  Have a female relative who has had breast cancer.  Some tests are not recommended for routine screening. Someone recovering from breast cancer does not need to have these tests if there are no problems. The tests have risks, such as radiation exposure, and can be costly. The risks of these tests are thought to be greater than the benefits:  Blood tests.  Chest X-rays.  Bone scans.  Liver ultrasound.  Computed tomography (CT scan).  Positron emission tomography (PET scan).  Magnetic resonance imaging (MRI scan). DIAGNOSIS OF RECURRENT CANCER Recurrent breast cancer may be suspected for various reasons. A mammogram may not look normal. You might feel a lump or have other symptoms. Your caregiver may find something unusual during an exam. To be sure, your caregiver will probably order some tests. The tests are needed because there are symptoms or hints of a problem. They could include:  Blood tests, including a test to check how well the liver is working. The liver is a common site for a distant cancer recurrence.  Imaging tests that create pictures of the inside of the body. These tests include:  Chest X-rays to show if the cancer has come back in the lungs.  CT scans to create detailed pictures of various areas of the body and help find a distant recurrence.  MRI scans to find anything unusual in the breast, chest, or lymph nodes.  Breast ultrasound tests to  examine the breasts.  Bone scans to create a picture of your whole skeleton and find cancer in bony areas.  PET scans to create an image of the whole body. PET scans can be used together with CT scans to show more detail.  Biopsy. A small sample of tissue is taken and checked under a microscope. If cancer cells are found, they may be tested to see if they contain the HER2 gene or the hormones estrogen and progesterone. This will help your caregiver decide how to treat the recurrent cancer. TREATMENT  How recurrent breast cancer is treated depends on where the new cancer is found. The type of treatment that was used for the first breast cancer makes a difference, too. A combination of treatments may be used. Options include:  Surgery.  If the cancer comes back in the breast that was not treated before, you may need a lumpectomy or mastectomy.  If the cancer comes back in the breast that was treated before, you may need a mastectomy.  The lymph nodes under the arm may need to be removed.  Radiation therapy.  For a local recurrence, radiation may be used if it was not used during the first treatment.  For a distance recurrence, radiation is sometimes used.  Chemotherapy.  This may be used before surgery to treat recurrent breast cancer.  This may be used to treat recurrent cancer that cannot be treated with surgery.  This may be used to treat a distant recurrence.  Hormone therapy.  Women with the HER2 gene may be given hormone therapy to attack this gene. Document Released: 11/10/2010 Document Revised: 06/06/2011 Document Reviewed: 11/10/2010 Exit[employer] Patient Information 2014 [employer].  

## 2013-04-08 NOTE — Progress Notes (Signed)
Pt here for Herceptin,  Confirmed with pt that she refused Tylenol and Benadryl as pre meds as ordered.

## 2013-04-08 NOTE — Progress Notes (Signed)
Chaplain made initial visit in infusion room. Pt's son was also present. Pt stated that this is her last round of chemo. She expressed excitement about finishing. Pt stated that she has highly supportive family members, especially her son, who was with her today. She stated that he had driven four hours to accompany her to this treatment. Pt stated that she had gone on a journey over the last year of cancer treatment and that she had found meaning in a psychic. She said that it had given her a lot of strength. Chaplain provided emotional/spiritual support as she processed her experience.

## 2013-04-10 ENCOUNTER — Telehealth: Payer: Self-pay | Admitting: *Deleted

## 2013-04-10 NOTE — Telephone Encounter (Signed)
sw pt gv appt for 08/21/13 w/labs@ 11am and ov@ 11:30am. i faxed the order to [name] @[place]  office and they will call the pt w/ an appt. The pt is aware that she will be called w/ an appt for Heather Vega....td

## 2013-04-11 ENCOUNTER — Other Ambulatory Visit: Payer: Medicare Other

## 2013-04-14 ENCOUNTER — Encounter: Payer: Self-pay | Admitting: Oncology

## 2013-04-15 ENCOUNTER — Other Ambulatory Visit (HOSPITAL_COMMUNITY): Payer: Self-pay | Admitting: Internal Medicine

## 2013-04-18 ENCOUNTER — Other Ambulatory Visit (HOSPITAL_COMMUNITY): Payer: Self-pay | Admitting: Cardiology

## 2013-04-18 ENCOUNTER — Ambulatory Visit (INDEPENDENT_AMBULATORY_CARE_PROVIDER_SITE_OTHER): Payer: Medicare Other | Admitting: Psychiatry

## 2013-04-18 DIAGNOSIS — F329 Major depressive disorder, single episode, unspecified: Secondary | ICD-10-CM

## 2013-04-18 DIAGNOSIS — F3289 Other specified depressive episodes: Secondary | ICD-10-CM

## 2013-04-18 DIAGNOSIS — C50919 Malignant neoplasm of unspecified site of unspecified female breast: Secondary | ICD-10-CM

## 2013-04-23 ENCOUNTER — Ambulatory Visit (HOSPITAL_COMMUNITY): Payer: Medicare Other

## 2013-05-02 ENCOUNTER — Ambulatory Visit: Payer: Medicare Other | Admitting: Psychiatry

## 2013-05-08 ENCOUNTER — Encounter (INDEPENDENT_AMBULATORY_CARE_PROVIDER_SITE_OTHER): Payer: Medicare Other | Admitting: General Surgery

## 2013-06-18 ENCOUNTER — Telehealth (INDEPENDENT_AMBULATORY_CARE_PROVIDER_SITE_OTHER): Payer: Self-pay

## 2013-06-18 NOTE — Telephone Encounter (Signed)
Called pt to make an appt with one of the breast surgeon's in the group to discuss having her port removed. The pt was scheduled with DR Donne Hazel on 05/08/13 but had to cancel due to bad weather. I advised pt that I couldn't make an appt with Dr Donne Hazel till May for her to come in to discuss having the port removed and she requested to see someone else. I scheduled her an appt with Dr Brantley Stage for 07/08/13 and the pt is fine with this plan.

## 2013-07-08 ENCOUNTER — Ambulatory Visit (INDEPENDENT_AMBULATORY_CARE_PROVIDER_SITE_OTHER): Payer: Medicare Other | Admitting: Surgery

## 2013-07-08 ENCOUNTER — Encounter (INDEPENDENT_AMBULATORY_CARE_PROVIDER_SITE_OTHER): Payer: Self-pay

## 2013-07-08 ENCOUNTER — Encounter (INDEPENDENT_AMBULATORY_CARE_PROVIDER_SITE_OTHER): Payer: Self-pay | Admitting: Surgery

## 2013-07-08 VITALS — BP 124/70 | HR 78 | Temp 97.6°F | Resp 14 | Ht 65.5 in | Wt 166.6 lb

## 2013-07-08 DIAGNOSIS — Z853 Personal history of malignant neoplasm of breast: Secondary | ICD-10-CM

## 2013-07-08 NOTE — Patient Instructions (Signed)
Will ask for clearance to stop plavix for 5 days for port removal if possible.

## 2013-07-08 NOTE — Progress Notes (Signed)
NAME: Heather Vega       DOB: [birth year]           DATE: 07/08/2013       MRN: [id number]  CC:   Chief Complaint  Patient presents with  . 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 She has no problems or concerns on either side. Had MI in January but is recovering.  Now on plavix for stents placed.   PFSH: She has had no significant changes since the last visit here.  ROS: There have been no significant changes since the last visit here  EXAM:  VS: BP 124/70  Pulse 78  Temp(Src) 97.6 F (36.4 C) (Temporal)  Resp 14  Ht 5' 5.5" (1.664 m)  Wt 166 lb 9.6 oz (75.569 kg)  BMI 27.29 kg/m2  General: The patient is alert, oriented, generally healthy appearing, NAD. Mood and affect are normal.  Breasts:  Right slightly smaller than the left, not tender, well healed port right side intact without signs of infection  Lymphatics: She has no axillary or supraclavicular adenopathy on either side.  Extremities: Full ROM of the surgical side with no lymphedema noted.   Data Reviewed: Chemo notes  Impression: Doing well, with no evidence of recurrent cancer or new cancer but with MI in January 2015 with stents now on plavix  Plan: Wants port out but need to stop plavix if possible to do so.  Will ask her cardiologist for opinion on that.   Don't recommend pulling port  while on plavix.Can wait to remove it if needed.  Otherwise appears cancer free currently.

## 2013-08-06 ENCOUNTER — Telehealth (INDEPENDENT_AMBULATORY_CARE_PROVIDER_SITE_OTHER): Payer: Self-pay

## 2013-08-06 NOTE — Telephone Encounter (Signed)
LMOM> calling to check up on clearance from cardiologist to stop plavix so we can schedule port removal. Will await pt return call.

## 2013-08-11 ENCOUNTER — Encounter: Payer: Self-pay | Admitting: Oncology

## 2013-08-12 ENCOUNTER — Telehealth: Payer: Self-pay | Admitting: Oncology

## 2013-08-12 ENCOUNTER — Other Ambulatory Visit: Payer: Self-pay | Admitting: *Deleted

## 2013-08-12 NOTE — Progress Notes (Signed)
Called [facility] to notify her An APP will see her tomorrow at 0945 to evaluate the rt. Arm.

## 2013-08-12 NOTE — Telephone Encounter (Signed)
Called patient who reports pain is to the underside of her right arm "where the flab is on older women when we wave our arms" (tricep).  "Pain started three to four weeks ago.  I no longer have to wear the life vest for my heart attack.  Cardiologist d/c'd this a week ago.  The life vest was tight, pushed all the skin and now slight redness in the rt. Armpit and underside of arm and rt. Breast where lumpectomy and lymph node removed January 2014.  Feels warm to touch."  Denies rash or broken skin.

## 2013-08-12 NOTE — Telephone Encounter (Signed)
, °

## 2013-08-13 ENCOUNTER — Ambulatory Visit (HOSPITAL_BASED_OUTPATIENT_CLINIC_OR_DEPARTMENT_OTHER): Payer: Medicare Other | Admitting: Nurse Practitioner

## 2013-08-13 ENCOUNTER — Other Ambulatory Visit (HOSPITAL_COMMUNITY): Payer: Self-pay | Admitting: *Deleted

## 2013-08-13 ENCOUNTER — Telehealth: Payer: Self-pay

## 2013-08-13 VITALS — BP 139/81 | HR 81 | Temp 98.4°F | Resp 18 | Ht 65.5 in | Wt 170.9 lb

## 2013-08-13 DIAGNOSIS — C773 Secondary and unspecified malignant neoplasm of axilla and upper limb lymph nodes: Secondary | ICD-10-CM

## 2013-08-13 DIAGNOSIS — N6459 Other signs and symptoms in breast: Secondary | ICD-10-CM

## 2013-08-13 DIAGNOSIS — M79609 Pain in unspecified limb: Secondary | ICD-10-CM

## 2013-08-13 DIAGNOSIS — M7989 Other specified soft tissue disorders: Secondary | ICD-10-CM

## 2013-08-13 DIAGNOSIS — C50911 Malignant neoplasm of unspecified site of right female breast: Secondary | ICD-10-CM

## 2013-08-13 DIAGNOSIS — C50419 Malignant neoplasm of upper-outer quadrant of unspecified female breast: Secondary | ICD-10-CM

## 2013-08-13 DIAGNOSIS — M799 Soft tissue disorder, unspecified: Secondary | ICD-10-CM

## 2013-08-13 MED ORDER — CARVEDILOL 6.25 MG PO TABS: ORAL_TABLET | ORAL | Status: AC

## 2013-08-13 NOTE — Progress Notes (Signed)
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 She underwent a lumpectomy and sentinel lymph node biopsy.The final pathology revealed a 1.8 cm invasive ductal carcinoma with low grade ductal carcinoma in situ the tumor was grade 2 ER positive PR positive Ki-67 was 11%. HER-2/neu was equivocal by cish.  #2 she had a sentinel lymph node biopsy one node was positive for metastatic disease. Making her a stage II A.  #3 tumor was sent forHER-2/neu testing by [facility]. By [facility] HER-2/neu is positive.  #4 Patient received combination chemotherapy consisting of Taxotere carboplatinum and Herceptin Beginning 03/30/2012- 08/17/2012 x 6 cycles.  #5 Patient underwent adjuvant radiation therapy starting 08/23/12 through 10/02/12.  #6 Every 3 week herceptin began 08/17/12 - 04/09/2013  #7. Letrozole therapy started in July 2014 this was stopped in 10/2012, and she was started on Arimidex in 11/2012 and is tolerating it well.   CURRENT THERAPY: daily Arimidex  INTERVAL HISTORY: Heather Vega is here today for evaluation only of her Vega in right upper arm and intermittent Vega to her right breast. I suspect this is due to soft tissue inflammation from her wearing a 'life vest' following an MI over the past 3 months. She did state her depression is better, but we did not do a full ROS but rather a focused exam and visit to evaluate for possible cellulitis. Upon further examination there is no evidence of infection, skin changes  - no redness and very little warmth. In fact it is just  barely discernible that the right arm adipose tissue is even swollen compared to the left arm.   REVIEW OF SYSTEMS: limited to symptoms related to her arm swelling and intermittent breast Vega.   PAST MEDICAL HISTORY: Past Medical History  Diagnosis Date  . Hyperlipidemia     doesn't require meds  . Asthma     pt states its "not bad"  . Back Vega     buldging disc and pt says 2 are "flat"  . Hemorrhoids   . History of colon polyps   . Diverticulosis   . Breast cancer     breast right  . Hard of hearing   . Diabetes mellitus without complication     takes Metformin daily,Type II  . Allergy   . Cataract     b/l surgery  . Hypothyroidism     hx of;but has been off of meds >36yr ago  . Wears glasses   . Hypertension     history, was on b/p  meds years ago  . PONV (postoperative nausea and vomiting)   . History of chemotherapy 03/30/2012-07/20/2012    6 cycles of taxotere, carboplatinum, herceptin    PAST SURGICAL HISTORY: Past Surgical History  Procedure Laterality Date  . Nose surgery  1953  . Tonsillectomy  1955  . Nasal fracture surgery      as a child x 2  . Tonsillectomy    . Breast surgery  70's    right  . Appendectomy    .  Bilateral cataract surgery    . Colonoscopy    . Neuroplasty / transposition median nerve at carpal tunnel bilateral    . Cardiac catheterization  20+yrs ago  . Breast lumpectomy with needle localization and axillary sentinel lymph node bx  02/07/2012    Procedure: BREAST LUMPECTOMY WITH NEEDLE LOCALIZATION AND AXILLARY SENTINEL LYMPH NODE BX;  Surgeon: Haywood Lasso, MD;  Location: [place];  Service: General;  Laterality: Right;  Right needle loc lumpectomy and Senitel Lymph Node Biopsy   . Abdominal hysterectomy      ovaries intact  . Portacath placement  03/26/2012    Procedure: INSERTION PORT-A-CATH;  Surgeon: Haywood Lasso, MD;  Location: [place];  Service: General;  Laterality: Left;    FAMILY  HISTORY Family History  Problem Relation Age of Onset  . Cancer Father     colon cancer  died 51,  of parkinson's     GYNECOLOGIC HISTORY:   SOCIAL HISTORY:      ADVANCED DIRECTIVES:    HEALTH MAINTENANCE: History  Substance Use Topics  . Smoking status: Former Smoker    Quit date: 07/25/1972  . Smokeless tobacco: Not on file     Comment: quit smoking 45yr ago  . Alcohol Use: No     Colonoscopy:  PAP:  Bone density:  Lipid panel:  Allergies  Allergen Reactions  . Piroxicam     Other reaction(s): UNKNOWN  . Sulfa Antibiotics     Other reaction(s): HIVES  . Tetanus Toxoids     Other reaction(s): SWELLING/EDEMA  . Gemfibrozil Rash  . Lisinopril Rash    cough    Current Outpatient Prescriptions  Medication Sig Dispense Refill  . anastrozole (ARIMIDEX) 1 MG tablet Take 1 tablet (1 mg total) by mouth daily.  30 tablet  6  . aspirin 81 MG tablet Take 81 mg by mouth daily.      . B Complex-C (SUPER B COMPLEX PO) Take 1 capsule by mouth daily.      . carvedilol (COREG) 6.25 MG tablet TAKE ONE TABLET BY MOUTH TWICE DAILY WITH MEALS  60 tablet  3  . CINNAMON PO Take 1 tablet by mouth 2 (two) times daily.       . clopidogrel (PLAVIX) 75 MG tablet Take 75 mg by mouth daily.      . hydrALAZINE (APRESOLINE) 10 MG tablet Take 10 mg by mouth 3 (three) times daily.       . Magnesium Citrate 100 MG TABS Take 100 mg by mouth 2 (two) times daily.      . metFORMIN (GLUCOPHAGE-XR) 500 MG 24 hr tablet Take 500 mg by mouth 2 (two) times daily.      . Multiple Vitamins-Minerals (MULTIVITAMIN WITH MINERALS) tablet Take 1 tablet by mouth daily.      . nitroGLYCERIN (NITROSTAT) 0.4 MG SL tablet Place 0.4 mg under the tongue as needed.      . pravastatin (PRAVACHOL) 80 MG tablet Take 80 mg by mouth daily.      . Vitamins C E (CRANBERRY CONCENTRATE PO) Take 1 tablet by mouth 2 (two) times daily.        No current facility-administered medications for this visit.    OBJECTIVE: Filed  Vitals:   08/13/13 0921  BP: 139/81  Pulse: 81  Temp: 98.4 F (36.9 C)  Resp: 18     Body mass index is 28 kg/(m^2).    ECOG FS:0 - Asymptomatic  Ocular: Sclerae unicteric, pupils equal,  round and reactive to light Ear-nose-throat: Oropharynx clear, dentition fair Lymphatic: No cervical or supraclavicular adenopathy Breasts: lumpectomy scar noted to right breast; both axillae negative   LAB RESULTS:  CMP     Component Value Date/Time   NA 142 04/08/2013 1236   NA 138 01/31/2012 1030   K 4.7 04/08/2013 1236   K 4.9 01/31/2012 1030   CL 102 09/07/2012 1137   CL 98 01/31/2012 1030   CO2 27 04/08/2013 1236   CO2 28 01/31/2012 1030   GLUCOSE 110 04/08/2013 1236   GLUCOSE 121* 09/07/2012 1137   GLUCOSE 119* 01/31/2012 1030   BUN 18.2 04/08/2013 1236   BUN 18 01/31/2012 1030   CREATININE 0.9 04/08/2013 1236   CREATININE 0.78 01/31/2012 1030   CALCIUM 9.8 04/08/2013 1236   CALCIUM 9.8 01/31/2012 1030   PROT 6.7 04/08/2013 1236   PROT 7.4 01/31/2012 1030   ALBUMIN 4.0 04/08/2013 1236   ALBUMIN 3.9 01/31/2012 1030   AST 23 04/08/2013 1236   AST 32 01/31/2012 1030   ALT 29 04/08/2013 1236   ALT 23 01/31/2012 1030   ALKPHOS 74 04/08/2013 1236   ALKPHOS 75 01/31/2012 1030   BILITOT 0.43 04/08/2013 1236   BILITOT 0.4 01/31/2012 1030   GFRNONAA 80* 01/31/2012 1030   GFRAA >90 01/31/2012 1030    I No results found for this basename: SPEP, UPEP,  kappa and lambda light chains    Lab Results  Component Value Date   WBC 6.1 04/08/2013   NEUTROABS 4.1 04/08/2013   HGB 13.3 04/08/2013   HCT 39.3 04/08/2013   MCV 90.9 04/08/2013   PLT 236 04/08/2013      Chemistry      Component Value Date/Time   NA 142 04/08/2013 1236   NA 138 01/31/2012 1030   K 4.7 04/08/2013 1236   K 4.9 01/31/2012 1030   CL 102 09/07/2012 1137   CL 98 01/31/2012 1030   CO2 27 04/08/2013 1236   CO2 28 01/31/2012 1030   BUN 18.2 04/08/2013 1236   BUN 18 01/31/2012 1030   CREATININE 0.9 04/08/2013 1236   CREATININE 0.78 01/31/2012 1030       Component Value Date/Time   CALCIUM 9.8 04/08/2013 1236   CALCIUM 9.8 01/31/2012 1030   ALKPHOS 74 04/08/2013 1236   ALKPHOS 75 01/31/2012 1030   AST 23 04/08/2013 1236   AST 32 01/31/2012 1030   ALT 29 04/08/2013 1236   ALT 23 01/31/2012 1030   BILITOT 0.43 04/08/2013 1236   BILITOT 0.4 01/31/2012 1030       Lab Results  Component Value Date   LABCA2 20 02/21/2012    No components found with this basename: [id number]    No results found for this basename: INR,  in the last 168 hours  Urinalysis    Component Value Date/Time   COLORURINE YELLOW 02/07/2012 0941   APPEARANCEUR HAZY* 02/07/2012 0941   LABSPEC 1.010 09/27/2012 1320   LABSPEC 1.018 02/07/2012 0941   PHURINE 5.0 02/07/2012 0941   GLUCOSEU Negative 09/27/2012 [place] 02/07/2012 [place] 02/07/2012 [place] 02/07/2012 0941   KETONESUR NEGATIVE 02/07/2012 0941   PROTEINUR NEGATIVE 02/07/2012 0941   UROBILINOGEN 0.2 09/27/2012 1320   UROBILINOGEN 0.2 02/07/2012 0941   NITRITE NEGATIVE 02/07/2012 0941   LEUKOCYTESUR MODERATE* 02/07/2012 0941    STUDIES: No results found.  ASSESSMENT/PLAN : 78 y.o. presented today due to concerns over Vega in soft tissue  of her right arm and intermittent shooting pains in her right breast. She has had to wear a 7 lb 'life vest' with a portable defibrillator over the past 3 months and it prevented her from wearing a bra. I found no evidence of cellulitis, infection or clinical concern upon physical examination. The adiopose tissue on her right upper arm is just mildly swollen compared to her left upper arm. There is no erythema, no tenderness upon examination, very mild warmth and no change in the skin itself. Macy states she had a MRI of the breast last week near [facility] but those results were not available to [place] today.  I spent approximately 15 minutes with her and her friend reviewing her options. As she has no mass or skin change or  erythema I do not think an ultrasound is indicated. I have advised her to put cold pack wrapped in pillowcase or washcloth on her breast when she has shooting pains and to continue taking Tylenol as needed. We will see her back next week for her regularly scheduled visit regarding her breast cancer.  She continues to tolerate her Herceptin IV every 21 days and her arimidex PO>   Discussed with Dr. Jana Hakim.   Bill Salinas, NP   08/13/2013 12:47 PM

## 2013-08-13 NOTE — Telephone Encounter (Signed)
Pt had MI in Jan and had to wear a lifevest - which was just removed 1 week ago.  Her right armpit and back of her right arm where it meets the axilla/torso is painful, swollen and feels inflamed.  Pt wants to know if she needs to be seen.  Routed to [facility] ([place])

## 2013-08-14 NOTE — Telephone Encounter (Signed)
Will you remind me if she had right sided surgery and node biopsy?

## 2013-08-20 ENCOUNTER — Telehealth: Payer: Self-pay | Admitting: Oncology

## 2013-08-20 NOTE — Telephone Encounter (Signed)
moved 5/28 lb/fu to 10:15am. s/w pt she is aware.

## 2013-08-21 ENCOUNTER — Other Ambulatory Visit: Payer: Medicare Other

## 2013-08-21 ENCOUNTER — Ambulatory Visit: Payer: Medicare Other | Admitting: Oncology

## 2013-08-22 ENCOUNTER — Ambulatory Visit (HOSPITAL_BASED_OUTPATIENT_CLINIC_OR_DEPARTMENT_OTHER): Payer: Medicare Other | Admitting: Oncology

## 2013-08-22 ENCOUNTER — Other Ambulatory Visit (HOSPITAL_BASED_OUTPATIENT_CLINIC_OR_DEPARTMENT_OTHER): Payer: Medicare Other

## 2013-08-22 ENCOUNTER — Telehealth: Payer: Self-pay | Admitting: Oncology

## 2013-08-22 ENCOUNTER — Encounter: Payer: Self-pay | Admitting: Oncology

## 2013-08-22 VITALS — BP 153/84 | HR 82 | Temp 98.4°F | Resp 20 | Ht 65.5 in | Wt 171.4 lb

## 2013-08-22 DIAGNOSIS — F341 Dysthymic disorder: Secondary | ICD-10-CM

## 2013-08-22 DIAGNOSIS — Z170 Estrogen receptor positive status [ER+]: Secondary | ICD-10-CM

## 2013-08-22 DIAGNOSIS — C50911 Malignant neoplasm of unspecified site of right female breast: Secondary | ICD-10-CM

## 2013-08-22 DIAGNOSIS — C773 Secondary and unspecified malignant neoplasm of axilla and upper limb lymph nodes: Secondary | ICD-10-CM

## 2013-08-22 DIAGNOSIS — C50419 Malignant neoplasm of upper-outer quadrant of unspecified female breast: Secondary | ICD-10-CM

## 2013-08-22 LAB — COMPREHENSIVE METABOLIC PANEL (CC13)
ALBUMIN: 3.9 g/dL (ref 3.5–5.0)
ALT: 14 U/L (ref 0–55)
AST: 15 U/L (ref 5–34)
Alkaline Phosphatase: 75 U/L (ref 40–150)
Anion Gap: 13 mEq/L — ABNORMAL HIGH (ref 3–11)
BUN: 14 mg/dL (ref 7.0–26.0)
CHLORIDE: 102 meq/L (ref 98–109)
CO2: 25 mEq/L (ref 22–29)
Calcium: 9.7 mg/dL (ref 8.4–10.4)
Creatinine: 0.8 mg/dL (ref 0.6–1.1)
GLUCOSE: 138 mg/dL (ref 70–140)
POTASSIUM: 4.5 meq/L (ref 3.5–5.1)
Sodium: 141 mEq/L (ref 136–145)
TOTAL PROTEIN: 6.4 g/dL (ref 6.4–8.3)
Total Bilirubin: 0.43 mg/dL (ref 0.20–1.20)

## 2013-08-22 LAB — CBC WITH DIFFERENTIAL/PLATELET
BASO%: 0.2 % (ref 0.0–2.0)
Basophils Absolute: 0 10*3/uL (ref 0.0–0.1)
EOS ABS: 0.2 10*3/uL (ref 0.0–0.5)
EOS%: 4.7 % (ref 0.0–7.0)
HCT: 37.7 % (ref 34.8–46.6)
HEMOGLOBIN: 12.4 g/dL (ref 11.6–15.9)
LYMPH#: 1 10*3/uL (ref 0.9–3.3)
LYMPH%: 19.5 % (ref 14.0–49.7)
MCH: 30 pg (ref 25.1–34.0)
MCHC: 32.9 g/dL (ref 31.5–36.0)
MCV: 91.1 fL (ref 79.5–101.0)
MONO#: 0.4 10*3/uL (ref 0.1–0.9)
MONO%: 8 % (ref 0.0–14.0)
NEUT%: 67.6 % (ref 38.4–76.8)
NEUTROS ABS: 3.5 10*3/uL (ref 1.5–6.5)
Platelets: 222 10*3/uL (ref 145–400)
RBC: 4.14 10*6/uL (ref 3.70–5.45)
RDW: 13.5 % (ref 11.2–14.5)
WBC: 5.1 10*3/uL (ref 3.9–10.3)

## 2013-08-22 NOTE — Progress Notes (Signed)
OFFICE PROGRESS NOTE  CC  Heather Donna, MD [address] [place] [address] Dr. Neldon Mc Dr. Arloa Koh  DIAGNOSIS: 78 year old female with new diagnosis of invasive ductal carcinoma that is ER positive PR positive HER-2/neu positive by [facility]  PRIOR THERAPY:  #1 patient was originally seenBy me on 02/21/2012 when she was diagnosed with invasive ductal carcinoma of the right breast stage II a with a positive lymph node. She underwent a lumpectomy and sentinel lymph node biopsy.The final pathology revealed a 1.8 cm invasive ductal carcinoma with low grade ductal carcinoma in situ the tumor was grade 2 ER positive PR positive Ki-67 was 11%. HER-2/neu was equivocal by [facility].  #2 she had a sentinel lymph node biopsy one node was positive for metastatic disease. Making her a stage II A.  #3 tumor was sent forHER-2/neu testing by [facility]. By [facility]  HER-2/neu is positive.  #4 Patient received combination chemotherapy consisting of Taxotere carboplatinum and Herceptin Beginning 03/30/2012- 08/17/2012 x 6 cycles.    #5 Patient underwent adjuvant radiation therapy starting 08/23/12 through 10/02/12.    #6 Every 3 week herceptin began 08/17/12 - 04/09/2013  #7.  Letrozole therapy started in July 2014 this was stopped in 10/2012, and she was started on Arimidex in 11/2012 and is tolerating it well.      CURRENT THERAPY:  daily Arimidex  INTERVAL HISTORY: Heather Vega 78 y.o. female is doing well today.  She is tolerating the Arimidex well.  She had an MI several months ago. She is being treated at [facility] She denies any lower extremity swelling, orthopnea, or further concerns. She denies any fevers chills night sweats headaches shortness of breath chest pains palpitations no myalgias and arthralgias. Patient has no peripheral paresthesias. She does develop some aches and pains but they're tolerable now. Denies breast masses and skin changes. Denies hot flashes.  Remainder of the 10 Point Review of Systems Is Negative.   MEDICAL HISTORY: Past Medical History  Diagnosis Date  . Hyperlipidemia     doesn't require meds  . Asthma     pt states its "not bad"  . Back pain     buldging disc and pt says 2 are "flat"  . Hemorrhoids   . History of colon polyps   . Diverticulosis   . Breast cancer     breast right  . Hard of hearing   . Diabetes mellitus without complication     takes Metformin daily,Type II  . Allergy   . Cataract     b/l surgery  . Hypothyroidism     hx of;but has been off of meds >48yr ago  . Wears glasses   . Hypertension     history, was on b/p  meds years ago  . PONV (postoperative nausea and vomiting)   . History of chemotherapy 03/30/2012-07/20/2012    6 cycles of taxotere, carboplatinum, herceptin    ALLERGIES:  is allergic to piroxicam; sulfa antibiotics; tetanus toxoids; gemfibrozil; and lisinopril.  MEDICATIONS:  Current Outpatient Prescriptions  Medication Sig Dispense Refill  . anastrozole (ARIMIDEX) 1 MG tablet Take 1 tablet (1 mg total) by mouth daily.  30 tablet  6  . aspirin 81 MG tablet Take 81 mg by mouth daily.      . B Complex-C (SUPER B COMPLEX PO) Take 1 capsule by mouth daily.      . carvedilol (COREG) 6.25 MG tablet TAKE ONE TABLET BY MOUTH TWICE DAILY WITH MEALS, MUST  HAVE FOLLOW UP APPOINTMENT FOR FURTHER REFILLS  60 tablet  1  . CINNAMON PO Take 1 tablet by mouth 2 (two) times daily.       . clopidogrel (PLAVIX) 75 MG tablet Take 75 mg by mouth daily.      . hydrALAZINE (APRESOLINE) 10 MG tablet Take 10 mg by mouth 3 (three) times daily.       . Magnesium Citrate 100 MG TABS Take 100 mg by mouth 2 (two) times daily.      . metFORMIN (GLUCOPHAGE-XR) 500 MG 24 hr tablet Take 500 mg by mouth 2 (two) times daily.      . Multiple Vitamins-Minerals (MULTIVITAMIN WITH MINERALS) tablet Take 1 tablet by mouth daily.      . nitroGLYCERIN (NITROSTAT) 0.4 MG SL tablet Place 0.4 mg under the tongue as  needed.      . pravastatin (PRAVACHOL) 80 MG tablet Take 80 mg by mouth daily.      . Vitamins C E (CRANBERRY CONCENTRATE PO) Take 1 tablet by mouth 2 (two) times daily.        No current facility-administered medications for this visit.    SURGICAL HISTORY:  Past Surgical History  Procedure Laterality Date  . Nose surgery  1953  . Tonsillectomy  1955  . Nasal fracture surgery      as a child x 2  . Tonsillectomy    . Breast surgery  70's    right  . Appendectomy    . Bilateral cataract surgery    . Colonoscopy    . Neuroplasty / transposition median nerve at carpal tunnel bilateral    . Cardiac catheterization  20+yrs ago  . Breast lumpectomy with needle localization and axillary sentinel lymph node bx  02/07/2012    Procedure: BREAST LUMPECTOMY WITH NEEDLE LOCALIZATION AND AXILLARY SENTINEL LYMPH NODE BX;  Surgeon: Haywood Lasso, MD;  Location: [place];  Service: General;  Laterality: Right;  Right needle loc lumpectomy and Senitel Lymph Node Biopsy   . Abdominal hysterectomy      ovaries intact  . Portacath placement  03/26/2012    Procedure: INSERTION PORT-A-CATH;  Surgeon: Haywood Lasso, MD;  Location: [place];  Service: General;  Laterality: Left;    REVIEW OF SYSTEMS:   A 10 point review of systems was conducted and is otherwise negative except for what is noted above.     PHYSICAL EXAMINATION: Blood pressure 153/84, pulse 82, temperature 98.4 F (36.9 C), temperature source Oral, resp. rate 20, height 5' 5.5" (1.664 m), weight 171 lb 6.4 oz (77.747 kg). Body mass index is 28.08 kg/(m^2). General: Patient is a well appearing female in no acute distress HEENT: PERRLA, sclerae anicteric no conjunctival pallor, MMM Neck: supple, no lymphadenopathy Lungs: clear to auscultation bilaterally, no wheezes, rhonchi, or rales Cardiovascular: regular rate rhythm, S1, S2, no murmurs, rubs or gallops Abdomen: Soft, non-tender, non-distended, normoactive  bowel sounds, no HSM Extremities: warm and well perfused, no clubbing, cyanosis, or edema Skin: No rashes or lesions Neuro: Non-focal ECOG PERFORMANCE STATUS: 0 - Asymptomatic  LABORATORY DATA: Lab Results  Component Value Date   WBC 5.1 08/22/2013   HGB 12.4 08/22/2013   HCT 37.7 08/22/2013   MCV 91.1 08/22/2013   PLT 222 08/22/2013      Chemistry      Component Value Date/Time   NA 141 08/22/2013 1011   NA 138 01/31/2012 1030   K 4.5 08/22/2013 1011   K 4.9 01/31/2012  1030   CL 102 09/07/2012 1137   CL 98 01/31/2012 1030   CO2 25 08/22/2013 1011   CO2 28 01/31/2012 1030   BUN 14.0 08/22/2013 1011   BUN 18 01/31/2012 1030   CREATININE 0.8 08/22/2013 1011   CREATININE 0.78 01/31/2012 1030      Component Value Date/Time   CALCIUM 9.7 08/22/2013 1011   CALCIUM 9.8 01/31/2012 1030   ALKPHOS 75 08/22/2013 1011   ALKPHOS 75 01/31/2012 1030   AST 15 08/22/2013 1011   AST 32 01/31/2012 1030   ALT 14 08/22/2013 1011   ALT 23 01/31/2012 1030   BILITOT 0.43 08/22/2013 1011   BILITOT 0.4 01/31/2012 1030       RADIOGRAPHIC STUDIES:  No results found.  ASSESSMENT: 78 year old female with  #1 stage II A. (T1 C. N1 a  cM0) invasive ductal carcinoma Measuring 1.8 cm ER positive PR positive HER-2/neu positive. One sentinel node was positive for metastatic disease.  #2 patient was a good candidate for adjuvant HER-2-based therapy. Patient underwent treatment with Taxotere carboplatinum and Herceptin. The Taxotere carboplatinum was given every 3 weeks with day 2 Neulasta for a total of 6 cycles. The Herceptin was be given weekly for the duration of the chemotherapy . She has now completed adjuvant single agent Herceptin on 04/08/2013.   #3 Patient has completed radiation therapy with Dr. Valere Dross and was started on Letrozole afterward in 09/2012.  She unfortunately was unable to tolerate the Letrozole and we started Arimidex which she is tolerating well.    #4 depression/anxiety  PLAN:  #1 The patient  is tolerating the Arimidex well. Recommend that she continue this medication.  #2 Patient reports a recent breast MRI done in [facility]. Will request a copy of this for her chart.  #3 Depression and anxiety are stable.   #3 Follow-up in 6 months   All questions were answered. The patient knows to call the clinic with any problems, questions or concerns. We can certainly see the patient much sooner if necessary.  I spent 15 minutes counseling the patient face to face. The total time spent in the appointment was 30 minutes.  Mikey Bussing, DNP, AGPCNP-BC

## 2013-08-22 NOTE — Telephone Encounter (Signed)
per pof to sch pt appt-gave pt appt times & date-pt has MY CHART and stated will use that as a reminder

## 2013-08-28 ENCOUNTER — Telehealth: Payer: Self-pay

## 2013-08-28 DIAGNOSIS — C50911 Malignant neoplasm of unspecified site of right female breast: Secondary | ICD-10-CM

## 2013-08-28 NOTE — Telephone Encounter (Signed)
Per [facility] - let pt know that MRI was of her back and that she needs to have a mammogram every year.  Pt requested mammo be done in [place] at the hospital.  Let pt know we would send order there and she should expect to hear from the hospital to schedule.  Pt voiced understanding.    Scheduled appt with Imaging Department at [facility] for bilateral mammogram on September 06, 2013 at 1130.  Faxed order to [phone].  Sent to scan.    Notified patient of d/t.  Pt voiced understanding.

## 2013-09-26 ENCOUNTER — Telehealth: Payer: Self-pay | Admitting: Hematology and Oncology

## 2013-09-26 ENCOUNTER — Encounter: Payer: Self-pay | Admitting: Adult Health

## 2013-09-26 ENCOUNTER — Other Ambulatory Visit: Payer: Self-pay | Admitting: *Deleted

## 2013-09-26 NOTE — Telephone Encounter (Addendum)
Received MyChart message from pt & called her to evaluate symptoms.  She reports that her breast hurts where she could see the staples in her mammogram & has been more uncomfortable since the mammogram although has had this for months with tingling down her arm & her arm also hurts under her arm & into the fatty tissue under her upper arm.  She reports that it kept her up last night.  She reports that this started after wearing a Life vest in Jan & occurred after wearing @ 1-2 months due to having an MI.  She has tried ice & heat & reports that neither helped much.  She states that elevation doesn't help.  She has pain med from her back doctor-hydrocodone 5/325 which she takes 1/2 bid & states that this helps her back but not the arm.  She also has Tramadol 50 mg to take q 6 hours but can't tol this that often.  She reports having an MRI due to known vertebral problems done in [facility].  Called & requested report @ [phone] & they need release of info.  Requested HIM to send for this.  It was a lumbar spine MRI.  Dr. Margot Chimes was her surgeon who has retired & she has seen Dr Brantley Stage.  She likes him & was going to get her port out but is on plavix & can't get out now.  She has seen Ivan Croft in the past for this problem.  The pt reports no swelling, redness, nor any color changes to skin or nail beds.  She denies any SOB or difficulty breathing nor any pain with inspiration.  Discussed with Selena Lesser NP & she will see pt mon 09/30/13 @ 9:15 am.  Pt is in agreement with this & knows to go to the ED if symptoms are worse or she develops any SOB or pain with inspiration.  POF to scheduler.

## 2013-09-26 NOTE — Telephone Encounter (Signed)
, °

## 2013-09-27 IMAGING — CR DG CHEST 1V PORT
1 series · 1 of 1 positions shown · non-contrast
Comparison: 02/07/2012

CLINICAL DATA: Port placement

PORTABLE CHEST - 1 VIEW

[view not recorded]
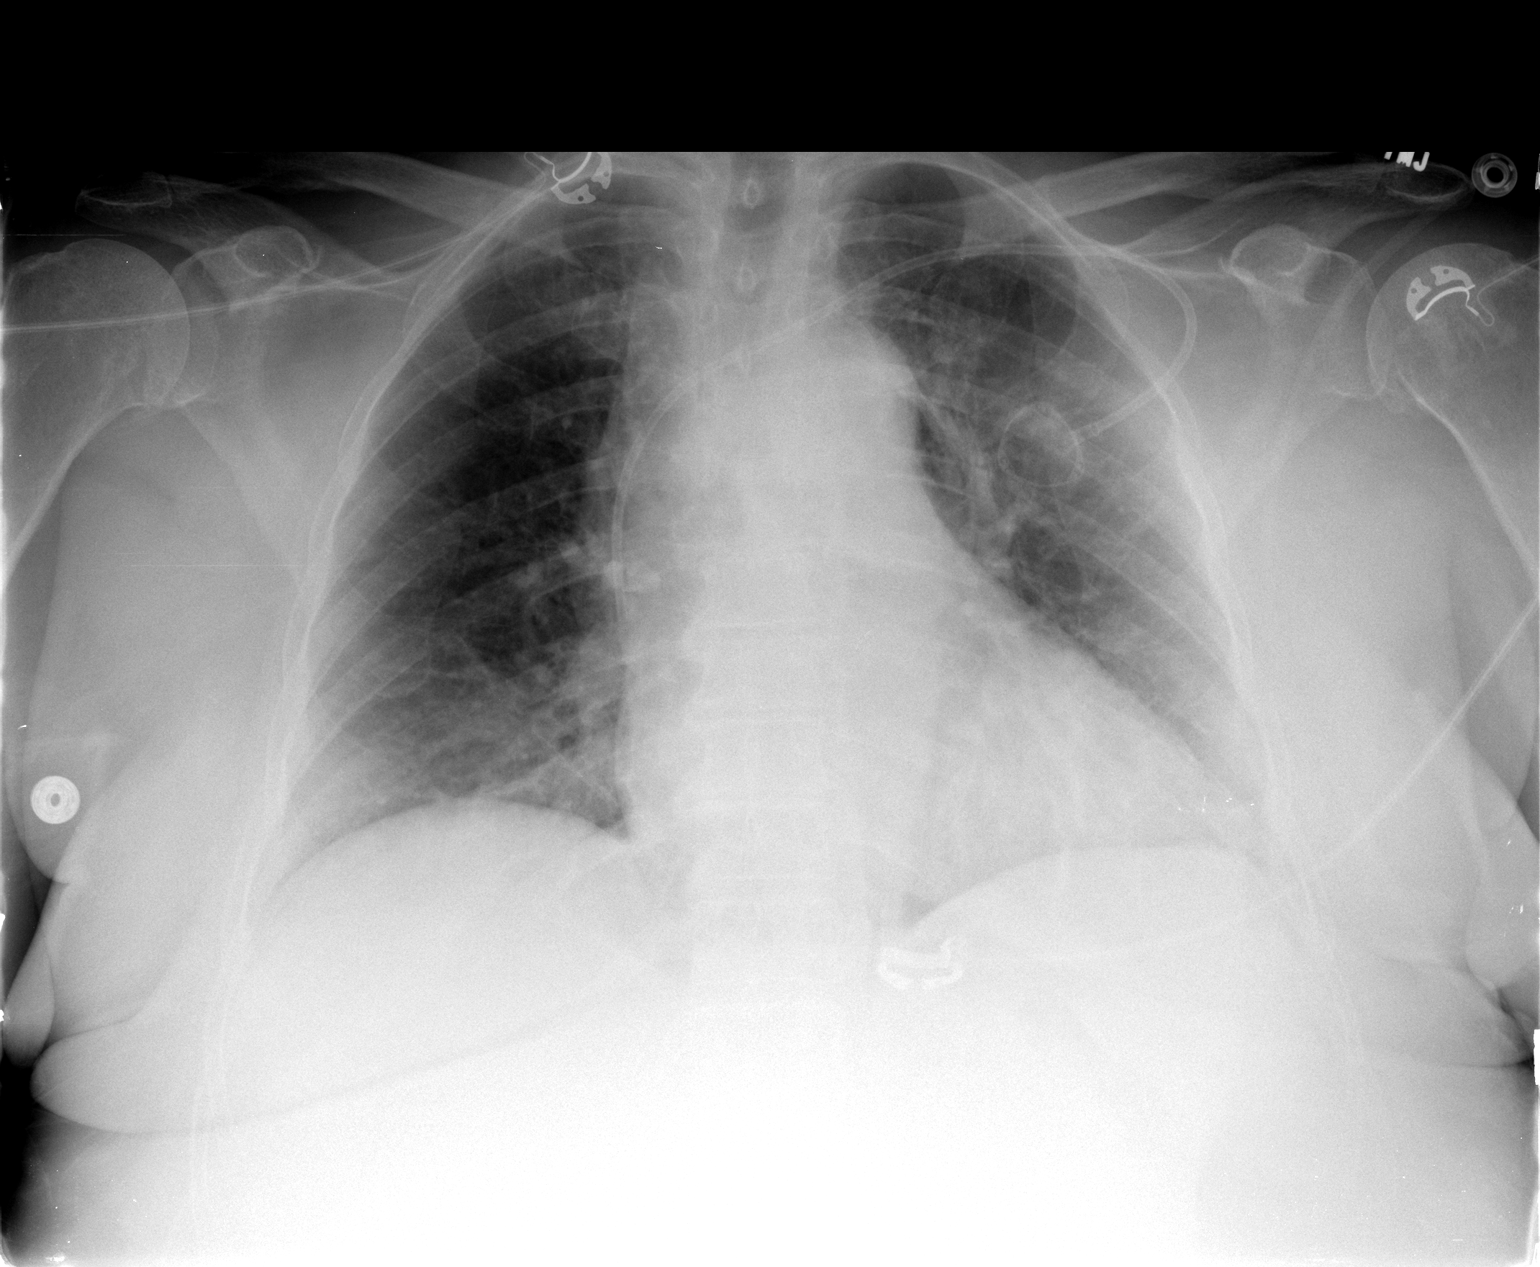

[1 of 1 positions shown; findings below may reference images not displayed]

FINDINGS: 5345 hours.  Left-sided Port-A-Cath is new in the
interval.  Tip positioned noted at the level of the mid to distal
SVC.  No evidence for pneumothorax. Cardiopericardial silhouette is
at upper limits of normal for size.  Low volumes without edema or
focal airspace consolidation. Telemetry leads overlie the chest.
IMPRESSION: The tip of the left-sided Port-A-Cath projects at the mid to distal
SVC level.  No evidence for pneumothorax.

## 2013-09-30 ENCOUNTER — Telehealth: Payer: Self-pay

## 2013-09-30 ENCOUNTER — Ambulatory Visit (HOSPITAL_BASED_OUTPATIENT_CLINIC_OR_DEPARTMENT_OTHER): Payer: Medicare Other | Admitting: Nurse Practitioner

## 2013-09-30 ENCOUNTER — Encounter: Payer: Self-pay | Admitting: Nurse Practitioner

## 2013-09-30 ENCOUNTER — Other Ambulatory Visit: Payer: Self-pay | Admitting: Nurse Practitioner

## 2013-09-30 ENCOUNTER — Telehealth: Payer: Self-pay | Admitting: Hematology and Oncology

## 2013-09-30 VITALS — BP 138/72 | HR 87 | Temp 97.5°F | Resp 18 | Ht 65.5 in | Wt 171.7 lb

## 2013-09-30 DIAGNOSIS — L237 Allergic contact dermatitis due to plants, except food: Secondary | ICD-10-CM

## 2013-09-30 DIAGNOSIS — I890 Lymphedema, not elsewhere classified: Secondary | ICD-10-CM

## 2013-09-30 DIAGNOSIS — N63 Unspecified lump in unspecified breast: Secondary | ICD-10-CM

## 2013-09-30 DIAGNOSIS — C50911 Malignant neoplasm of unspecified site of right female breast: Secondary | ICD-10-CM

## 2013-09-30 DIAGNOSIS — N644 Mastodynia: Secondary | ICD-10-CM | POA: Insufficient documentation

## 2013-09-30 DIAGNOSIS — C50419 Malignant neoplasm of upper-outer quadrant of unspecified female breast: Secondary | ICD-10-CM

## 2013-09-30 MED ORDER — HYDROCODONE-ACETAMINOPHEN 5-325 MG PO TABS: 1.0000 | ORAL_TABLET | Freq: Four times a day (QID) | ORAL | Status: AC | PRN

## 2013-09-30 NOTE — Telephone Encounter (Signed)
pt already on schedule for f/u in nov. lymphedema clinic will call pt w/appt. referral in work que.

## 2013-09-30 NOTE — Telephone Encounter (Signed)
Mammogram results received from [facility] dtd 09/30/13 Dr. Autumn Patty

## 2013-09-30 NOTE — Assessment & Plan Note (Signed)
It appears that pt's poison oak is resolving with no infection. Patient advised to continue with Calomine lotion to sites PRN.

## 2013-09-30 NOTE — Addendum Note (Signed)
Addended by: Drue Second R on: 09/30/2013 03:47 PM   Modules accepted: Orders

## 2013-09-30 NOTE — Progress Notes (Signed)
 Chief Complaint  Patient presents with  . Breast Pain    HISTORY: Heather Vega 78 y.o. female was diagnosed with invasive ductal carcinoma of the right breast stage II with a positive lymph node.  She underwent a lumpectomy and sentinel lymph node biopsy with final pathology revealing a 1.8 cm invasive ductal carcinoma with low grade ductal carcinoma in situ; the tumor was grade 2 ER positive, PR positive, HER-2/neu equivocal.  The patient had a sentinel lymph node biopsy with one node positive for metastatic disease.  The patient received Taxotere/carboplatinum/Herceptin for a total of 6 cycles.  She  received Herceptin on an every three-week basis; finishing the Herceptin.  04/08/2013.  She also completed radiation therapy with Dr. Murray and was started on Letrozole afterwards in July 2014.  She did not tolerate the letrozole; and was initiated on anastrozole which he is tolerating fairly well.  The patient called the cancer Center and requested an urgent care visit.  The patient states that she has been experiencing some chronic right chest wall and right axilla area discomfort for proximally 3 months.  She states that she did suffer with a myocardial infarction in January 2015; and underwent. extensive cardiac rehabilitation at that time.  She states that she was using the arm bicycle exercise machine fairly regularly-and this caused some right axilla discomfort.  She has followed up with Dr. Dean, local cardiologist on a regular basis.  Her last echo was 03/06/2013.  She has an appointment to follow up again with Dr. Dean next week as well.  She continues to deny any chest pain/chest pressure/shortness of breath whatsoever.  Patient states that recent  mammogram and ultrasound on 09/10/2013  revealed no new or concerning masses to the right breast.  However, there was a new finding of an oval, intermediate echogenicity circumscribed mass measuring 7.3 mm by 3 mm x 5.4 mm in the 2:00 position  to the far lateral left breast.  Patient states that these findings were reviewed by the radiologist; and she was advised to obtain a followup mammogram of the left breast in approximately 6 months.  Patient is also noted to have a recent poison oak dermatitis.    CURRENT THERAPY: Anastrozole daily.    Past Medical History  Diagnosis Date  . Hyperlipidemia     doesn't require meds  . Asthma     pt states its "not bad"  . Back pain     buldging disc and pt says 2 are "flat"  . Hemorrhoids   . History of colon polyps   . Diverticulosis   . Breast cancer     breast right  . Hard of hearing   . Diabetes mellitus without complication     takes Metformin daily,Type II  . Allergy   . Cataract     b/l surgery  . Hypothyroidism     hx of;but has been off of meds >10yrs ago  . Wears glasses   . Hypertension     history, was on b/p  meds years ago  . PONV (postoperative nausea and vomiting)   . History of chemotherapy 03/30/2012-07/20/2012    6 cycles of taxotere, carboplatinum, herceptin    has Breast cancer, right breast; HTN (hypertension); Chest pain; Breast pain, right; Lymphedema of upper extremity; and Poison oak dermatitis on her problem list.     is allergic to piroxicam; sulfa antibiotics; tetanus toxoids; gemfibrozil; and lisinopril.    Medication List         This list is accurate as of: 09/30/13  2:29 PM.  Always use your most recent med list.               anastrozole 1 MG tablet  Commonly known as:  ARIMIDEX  Take 1 tablet (1 mg total) by mouth daily.     aspirin 81 MG tablet  Take 81 mg by mouth daily.     carvedilol 6.25 MG tablet  Commonly known as:  COREG  TAKE ONE TABLET BY MOUTH TWICE DAILY WITH MEALS, MUST HAVE FOLLOW UP APPOINTMENT FOR FURTHER REFILLS     CINNAMON PO  Take 1 tablet by mouth 2 (two) times daily.     clopidogrel 75 MG tablet  Commonly known as:  PLAVIX  Take 75 mg by mouth daily.     CRANBERRY CONCENTRATE PO  Take 1 tablet by  mouth 2 (two) times daily.     hydrALAZINE 10 MG tablet  Commonly known as:  APRESOLINE  Take 10 mg by mouth 3 (three) times daily.     HYDROcodone-acetaminophen 5-325 MG per tablet  Commonly known as:  NORCO/VICODIN  Take 1-2 tablets by mouth every 6 (six) hours as needed for moderate pain.     Magnesium Citrate 100 MG Tabs  Take 100 mg by mouth 2 (two) times daily.     metFORMIN 500 MG 24 hr tablet  Commonly known as:  GLUCOPHAGE-XR  Take 500 mg by mouth 2 (two) times daily.     multivitamin with minerals tablet  Take 1 tablet by mouth daily.     nitroGLYCERIN 0.4 MG SL tablet  Commonly known as:  NITROSTAT  Place 0.4 mg under the tongue as needed.     pravastatin 80 MG tablet  Commonly known as:  PRAVACHOL  Take 80 mg by mouth daily.     SUPER B COMPLEX PO  Take 1 capsule by mouth daily.         Past Surgical History  Procedure Laterality Date  . Nose surgery  1953  . Tonsillectomy  1955  . Nasal fracture surgery      as a child x 2  . Tonsillectomy    . Breast surgery  70's    right  . Appendectomy    . Bilateral cataract surgery    . Colonoscopy    . Neuroplasty / transposition median nerve at carpal tunnel bilateral    . Cardiac catheterization  20+yrs ago  . Breast lumpectomy with needle localization and axillary sentinel lymph node bx  02/07/2012    Procedure: BREAST LUMPECTOMY WITH NEEDLE LOCALIZATION AND AXILLARY SENTINEL LYMPH NODE BX;  Surgeon: Haywood Lasso, MD;  Location: [place];  Service: General;  Laterality: Right;  Right needle loc lumpectomy and Senitel Lymph Node Biopsy   . Abdominal hysterectomy      ovaries intact  . Portacath placement  03/26/2012    Procedure: INSERTION PORT-A-CATH;  Surgeon: Haywood Lasso, MD;  Location: [place];  Service: General;  Laterality: Left;    Patient denies any headaches, dizziness, double vision, fevers, chills, night sweats, nausea, vomiting, diarrhea, constipation, chest pain,  heart palpitations, shortness of breath, blood in stool, black tarry stool, urinary pain, urinary burning, urinary frequency, hematuria.    PHYSICAL EXAMINATION  Filed Vitals:   09/30/13 0924  BP: 138/72  Pulse: 87  Temp: 97.5 F (36.4 C)  Resp: 18    GENERAL:alert, no distress, well nourished, well developed and obese SKIN: skin color,  texture, turgor are normal, multiple areas of dried poison oak-type rasht o forearms and hands. No infection noted.  EYES: PERRLA, Conjunctiva are pink and non-injected OROPHARYNX:lips, buccal mucosa, and tongue normal  NECK: no adenopathy, no bruits, no JVD LYMPH:  no palpable lymphadenopathy BREAST:breasts appear normal, no suspicious masses, no skin or nipple changes or axillary nodes, no palpable massess noted to left breast. No tenderness to either right or left breast with palpation.  LUNGS: negative findings:  chest symmetric with normal A/P diameter, normal respiratory rate and rhythm, no chest wall tenderness, lungs clear to auscultation HEART: regular rate & rhythm, no murmurs and no gallops ABDOMEN:abdomen soft, non-tender, obese, normal bowel sounds and no masses or organomegaly BACK: No CVA tenderness, Range of motion is normal EXTREMITIES:less then 2 second capillary refill, no joint deformities, effusion, or inflammation, no skin discoloration, no clubbing, no cyanosis.  Right axilla and right arm lymphedema noted.  NEURO: alert & oriented x 3 with fluent speech, gait normal  LABORATORY DATA:   CBC    Component Value Date/Time   WBC 5.1 08/22/2013 1011   WBC 7.4 01/31/2012 1030   RBC 4.14 08/22/2013 1011   RBC 4.49 01/31/2012 1030   HGB 12.4 08/22/2013 1011   HGB 11.6* 03/26/2012 0716   HCT 37.7 08/22/2013 1011   HCT 39.7 01/31/2012 1030   PLT 222 08/22/2013 1011   PLT 267 01/31/2012 1030   MCV 91.1 08/22/2013 1011   MCV 88.4 01/31/2012 1030   MCH 30.0 08/22/2013 1011   MCH 30.5 01/31/2012 1030   MCHC 32.9 08/22/2013 1011   MCHC 34.5  01/31/2012 1030   RDW 13.5 08/22/2013 1011   RDW 13.0 01/31/2012 1030   LYMPHSABS 1.0 08/22/2013 1011   LYMPHSABS 2.2 01/31/2012 1030   MONOABS 0.4 08/22/2013 1011   MONOABS 0.4 01/31/2012 1030   EOSABS 0.2 08/22/2013 1011   EOSABS 0.2 01/31/2012 1030   BASOSABS 0.0 08/22/2013 1011   BASOSABS 0.0 01/31/2012 1030        Component Value Date/Time   NA 141 08/22/2013 1011   NA 138 01/31/2012 1030   K 4.5 08/22/2013 1011   K 4.9 01/31/2012 1030   CL 102 09/07/2012 1137   CL 98 01/31/2012 1030   CO2 25 08/22/2013 1011   CO2 28 01/31/2012 1030   GLUCOSE 138 08/22/2013 1011   GLUCOSE 121* 09/07/2012 1137   GLUCOSE 119* 01/31/2012 1030   BUN 14.0 08/22/2013 1011   BUN 18 01/31/2012 1030   CREATININE 0.8 08/22/2013 1011   CREATININE 0.78 01/31/2012 1030   CALCIUM 9.7 08/22/2013 1011   CALCIUM 9.8 01/31/2012 1030   PROT 6.4 08/22/2013 1011   PROT 7.4 01/31/2012 1030   ALBUMIN 3.9 08/22/2013 1011   ALBUMIN 3.9 01/31/2012 1030   AST 15 08/22/2013 1011   AST 32 01/31/2012 1030   ALT 14 08/22/2013 1011   ALT 23 01/31/2012 1030   ALKPHOS 75 08/22/2013 1011   ALKPHOS 75 01/31/2012 1030   BILITOT 0.43 08/22/2013 1011   BILITOT 0.4 01/31/2012 1030   GFRNONAA 80* 01/31/2012 1030   GFRAA >90 01/31/2012 1030    ASSESSMENT/PLAN:    1.Breast Cancer: Patient will continue taking anastrozole 1 mg on a daily basis.   2. Breast Pain: Patient's right breast and axilla region with no evidence of palpable mass or tenderness on exam.  Patient is noted to have a lymphedema to the right upper extremity.  She continues with full range of motion to the right   upper extremity.  Recent right mammogram should note new findings or concerns.  She did however, have a newly noted left outer breast small mass that has been reviewed with her.  Patient has plans for a followup mammogram of the left breast in approximately 6 months-which will be in November 2015.  Advised him patient to follow up with the lymphedema clinic for lymph massage  instructions; and to receive a lymphedema sleeve. Also advised that patient follow up with PT to see if this helps. Patient has been taking hydrocodone 1 tablet BID PRN pain; and requested a refill today. Refill was given.  3.  Lymphedema: Patient advised to follow up with the Lymphedema clinic for lymph massage instruction; and to receive a lymphedema sleeve.   4. Poison Oak: It appears that pt's poison oak is resolving with no infection. Patient advised to continue with Calomine lotion to sites PRN.  Patient stated understanding of all instructions; and was in agreement with this plan of care. The patient knows to call the clinic with any problems, questions or concerns.   Total time spent with patient was 40 minutes;  with greater than 75 percent of time spent in face to face counseling regarding lymphedema, pain management, physical therapy; and coordination of care and follow up.     Drue Second, NP 09/30/2013

## 2013-09-30 NOTE — Assessment & Plan Note (Signed)
Patient will continue taking anastrozole 1 mg on a daily basis.

## 2013-09-30 NOTE — Assessment & Plan Note (Signed)
Patient advised to follow up with the Lymphedema clinic for lymph massage instruction; and to receive a lymphedema sleeve.

## 2013-09-30 NOTE — Assessment & Plan Note (Addendum)
Patient's right breast and axilla region with no evidence of palpable mass or tenderness on exam.  Patient is noted to have a lymphedema to the right upper extremity.  She continues with full range of motion to the right upper extremity.  Recent right mammogram should note new findings or concerns.  She did however, have a newly noted left outer breast small mass that has been reviewed with her.  Patient has plans for a followup mammogram of the left breast in approximately 6 months-which will be in November 2015.  Advised him patient to follow up with the lymphedema clinic for lymph massage instructions; and to receive a lymphedema sleeve. Also advised that patient follow up with PT to see if this helps. Patient has been taking hydrocodone 1 tablet BID PRN pain; and requested a refill today. Refill was given.

## 2013-10-01 ENCOUNTER — Telehealth: Payer: Self-pay | Admitting: *Deleted

## 2013-10-01 NOTE — Telephone Encounter (Signed)
PT. HAS NOT RECEIVED A CALL FROM THE [facility]EMA [facility] CONCERNING HER APPOINTMENT. CALLED NANCY AT THE [facility]EMA CLINIC. SHE WILL CONTACT PT. TODAY CONCERNING AN APPOINTMENT.

## 2013-10-02 ENCOUNTER — Ambulatory Visit: Payer: Medicare Other | Attending: Nurse Practitioner | Admitting: Physical Therapy

## 2013-10-02 ENCOUNTER — Telehealth: Payer: Self-pay | Admitting: *Deleted

## 2013-10-02 DIAGNOSIS — I890 Lymphedema, not elsewhere classified: Secondary | ICD-10-CM | POA: Diagnosis not present

## 2013-10-02 DIAGNOSIS — I252 Old myocardial infarction: Secondary | ICD-10-CM | POA: Diagnosis not present

## 2013-10-02 DIAGNOSIS — IMO0001 Reserved for inherently not codable concepts without codable children: Secondary | ICD-10-CM | POA: Diagnosis present

## 2013-10-02 DIAGNOSIS — C50919 Malignant neoplasm of unspecified site of unspecified female breast: Secondary | ICD-10-CM | POA: Insufficient documentation

## 2013-10-02 NOTE — Telephone Encounter (Signed)
See documentation with note to call patient.

## 2013-10-02 NOTE — Progress Notes (Signed)
Called patient at 1625 for f/u.  Seen this week by Selena Lesser NP.  Noted Lymphedema evaluation appointment today was kept.  Awaiting return call from patient with update.

## 2013-10-03 ENCOUNTER — Ambulatory Visit: Payer: Medicare Other | Admitting: Physical Therapy

## 2013-10-03 DIAGNOSIS — IMO0001 Reserved for inherently not codable concepts without codable children: Secondary | ICD-10-CM | POA: Diagnosis not present

## 2013-10-07 ENCOUNTER — Telehealth: Payer: Self-pay

## 2013-10-07 NOTE — Telephone Encounter (Signed)
Received Assessment and Plan for Treatment from [facility] Patient outpatient rehab dtd 10/02/13.  Signed by APP and faxed back.  Sent to scan.

## 2013-10-08 ENCOUNTER — Ambulatory Visit: Payer: Medicare Other

## 2013-10-08 DIAGNOSIS — IMO0001 Reserved for inherently not codable concepts without codable children: Secondary | ICD-10-CM | POA: Diagnosis not present

## 2013-10-10 ENCOUNTER — Ambulatory Visit: Payer: Medicare Other | Admitting: Physical Therapy

## 2013-10-10 DIAGNOSIS — IMO0001 Reserved for inherently not codable concepts without codable children: Secondary | ICD-10-CM | POA: Diagnosis not present

## 2013-10-16 ENCOUNTER — Ambulatory Visit: Payer: Medicare Other | Admitting: Physical Therapy

## 2013-10-16 DIAGNOSIS — IMO0001 Reserved for inherently not codable concepts without codable children: Secondary | ICD-10-CM | POA: Diagnosis not present

## 2013-10-18 ENCOUNTER — Ambulatory Visit: Payer: Medicare Other | Admitting: Physical Therapy

## 2013-10-18 DIAGNOSIS — IMO0001 Reserved for inherently not codable concepts without codable children: Secondary | ICD-10-CM | POA: Diagnosis not present

## 2013-10-21 ENCOUNTER — Ambulatory Visit: Payer: Medicare Other | Admitting: Physical Therapy

## 2013-10-28 ENCOUNTER — Encounter: Payer: Medicare Other | Admitting: Physical Therapy

## 2013-11-04 ENCOUNTER — Encounter: Payer: Medicare Other | Admitting: Physical Therapy

## 2013-11-04 ENCOUNTER — Other Ambulatory Visit: Payer: Self-pay | Admitting: Nurse Practitioner

## 2013-11-05 ENCOUNTER — Other Ambulatory Visit: Payer: Self-pay | Admitting: *Deleted

## 2013-11-05 DIAGNOSIS — C50911 Malignant neoplasm of unspecified site of right female breast: Secondary | ICD-10-CM

## 2013-11-05 DIAGNOSIS — C50919 Malignant neoplasm of unspecified site of unspecified female breast: Secondary | ICD-10-CM

## 2013-11-05 MED ORDER — ANASTROZOLE 1 MG PO TABS: 1.0000 mg | ORAL_TABLET | Freq: Every day | ORAL | Status: DC

## 2013-11-05 MED ORDER — TRAMADOL HCL 50 MG PO TABS: 50.0000 mg | ORAL_TABLET | Freq: Four times a day (QID) | ORAL | Status: AC | PRN

## 2013-11-05 NOTE — Telephone Encounter (Signed)
Filled per MyChart request which was authorized by Charlestine Massed RN.

## 2013-11-07 ENCOUNTER — Encounter: Payer: Medicare Other | Admitting: Physical Therapy

## 2013-12-03 ENCOUNTER — Encounter: Payer: Self-pay | Admitting: Nurse Practitioner

## 2014-01-24 ENCOUNTER — Encounter: Payer: Self-pay | Admitting: Nurse Practitioner

## 2014-02-10 ENCOUNTER — Other Ambulatory Visit (HOSPITAL_BASED_OUTPATIENT_CLINIC_OR_DEPARTMENT_OTHER): Payer: Medicare Other

## 2014-02-10 ENCOUNTER — Ambulatory Visit (HOSPITAL_BASED_OUTPATIENT_CLINIC_OR_DEPARTMENT_OTHER): Payer: Medicare Other | Admitting: Hematology and Oncology

## 2014-02-10 ENCOUNTER — Telehealth: Payer: Self-pay | Admitting: Hematology and Oncology

## 2014-02-10 VITALS — BP 154/74 | HR 71 | Temp 98.1°F | Resp 18 | Ht 65.5 in | Wt 161.1 lb

## 2014-02-10 DIAGNOSIS — Z170 Estrogen receptor positive status [ER+]: Secondary | ICD-10-CM

## 2014-02-10 DIAGNOSIS — C50911 Malignant neoplasm of unspecified site of right female breast: Secondary | ICD-10-CM

## 2014-02-10 LAB — CBC WITH DIFFERENTIAL/PLATELET
BASO%: 0.3 % (ref 0.0–2.0)
Basophils Absolute: 0 10*3/uL (ref 0.0–0.1)
EOS%: 4.8 % (ref 0.0–7.0)
Eosinophils Absolute: 0.3 10*3/uL (ref 0.0–0.5)
HCT: 38 % (ref 34.8–46.6)
HGB: 12.8 g/dL (ref 11.6–15.9)
LYMPH%: 18.4 % (ref 14.0–49.7)
MCH: 30.6 pg (ref 25.1–34.0)
MCHC: 33.7 g/dL (ref 31.5–36.0)
MCV: 90.9 fL (ref 79.5–101.0)
MONO#: 0.3 10*3/uL (ref 0.1–0.9)
MONO%: 5.4 % (ref 0.0–14.0)
NEUT#: 4.3 10*3/uL (ref 1.5–6.5)
NEUT%: 71.1 % (ref 38.4–76.8)
PLATELETS: 278 10*3/uL (ref 145–400)
RBC: 4.18 10*6/uL (ref 3.70–5.45)
RDW: 13.9 % (ref 11.2–14.5)
WBC: 6.1 10*3/uL (ref 3.9–10.3)
lymph#: 1.1 10*3/uL (ref 0.9–3.3)

## 2014-02-10 LAB — COMPREHENSIVE METABOLIC PANEL (CC13)
ALBUMIN: 4 g/dL (ref 3.5–5.0)
ALK PHOS: 82 U/L (ref 40–150)
ALT: 17 U/L (ref 0–55)
AST: 17 U/L (ref 5–34)
Anion Gap: 7 mEq/L (ref 3–11)
BILIRUBIN TOTAL: 0.39 mg/dL (ref 0.20–1.20)
BUN: 15.4 mg/dL (ref 7.0–26.0)
CO2: 31 mEq/L — ABNORMAL HIGH (ref 22–29)
Calcium: 9.8 mg/dL (ref 8.4–10.4)
Chloride: 101 mEq/L (ref 98–109)
Creatinine: 0.8 mg/dL (ref 0.6–1.1)
Glucose: 130 mg/dl (ref 70–140)
POTASSIUM: 4.1 meq/L (ref 3.5–5.1)
Sodium: 139 mEq/L (ref 136–145)
TOTAL PROTEIN: 6.9 g/dL (ref 6.4–8.3)

## 2014-02-10 MED ORDER — ANASTROZOLE 1 MG PO TABS: 1.0000 mg | ORAL_TABLET | Freq: Every day | ORAL | Status: AC

## 2014-02-10 NOTE — Assessment & Plan Note (Signed)
Right breast: stage II A. (T1 C. N1 a cM0) invasive ductal carcinoma Measuring 1.8 cm ER positive PR positive HER-2/neu positive. One sentinel node was positive for metastatic disease.status post 6 cycles of TCH followed by Herceptin maintenance. Received adjuvant radiation therapy and is currently on Arimidex that started July 2014.  Aromatase inhibitor counseling: Patient is tolerating Arimidex extremely well without any major problems or concerns. I discussed with her that she will need a bone density test every other year.  Surveillance: Mammograms done in June 2015 revealed a nodule in the left breast axilla. This was felt to be benign lymph node. She will be scheduling herself for another mammogram and ultrasound in 03/11/2014. I will discuss the results with her on the phone. If it is negative, then she will be seen by a 66 months. If it is suspicious for any malignancy, she will need to undergo biopsy and followup as needed.  Patient's son accompanied her and discuss with him he extensively whether she was truly HER-2 positive. I discussed with him that I not aware of the significance of Hermark testing. But I reassured him that there is not ongoing discussion whether even though HER-2 positive patients may benefit from anti-HER-2 therapy. I also reassured him that the majority of adjuvant therapy benefit is provided by the adjuvant antiestrogen therapy.

## 2014-02-10 NOTE — Telephone Encounter (Signed)
, °

## 2014-02-10 NOTE — Progress Notes (Signed)
Patient Care Team: [name], MD as PCP - General (Family Medicine)  DIAGNOSIS: Breast cancer, right breast   Staging form: Breast, AJCC 7th Edition     Clinical: No stage assigned - Unsigned       Prognostic indicators: ER= 100PR=100%  Ki67 = 11%  Her2 = 1.74, No amplification      Pathologic: Stage IIA (T1c, N1a, cM0) - Signed by Haywood Lasso, MD on 02/09/2012       Prognostic indicators: ER= 100PR=100%  Ki67 = 11%  Her2 = 1.74, No amplification    SUMMARY OF ONCOLOGIC HISTORY: Oncology History       Breast cancer, right breast   02/07/2012 Surgery Right breast lumpectomy: Invasive ductal carcinoma grade 2, 1.8 cm with low-grade DCIS, 1 SLN positive, ER 100%, PR at present, HER-2 negative ratio 1.74 Ki-67 11% T1 C. N1 M0 stage IIA, HER-2 by her mark positive   03/30/2012 - 04/09/2013 Chemotherapy Taxotere, carboplatin, Herceptin x6 cycles followed by Herceptin maintenance that completed 04/26/2013   08/23/2012 - 10/02/2012 Radiation Therapy Adjuvant radiation therapy   10/10/2012 -  Anti-estrogen oral therapy Initially letrozole which could not be tolerated, no tolerating Arimidex since 11/2012    CHIEF COMPLIANT: followup of breast cancer  INTERVAL HISTORY: Heather Vega is a 78 year old Caucasian lady with above-mentioned history of right-sided breast cancer which was found to be equivocal on HER-2 testing but was found to be positive by her mark tests. She received [facility] for 6 cycles as adjuvant chemotherapy followed by Herceptin maintenance it was completed in January 2015. Subsequently she had an acute MI and had 2 stents that were placed. She has been on oral antiestrogen therapy since July 2014. She has been tolerating it fairly well without any major problems or concerns. She had a mammogram done in June 2015 which revealed an abnormality in the left axilla. This was felt to be benign lymph node. She has another mammogram and ultrasound that was scheduled to be done on  December 2015 to evaluate this again. Patient does not report any pain or discomfort of any lumps or nodules in the breast.  REVIEW OF SYSTEMS:   Constitutional: Denies fevers, chills or abnormal weight loss Eyes: Denies blurriness of vision Ears, nose, mouth, throat, and face: Denies mucositis or sore throat Respiratory: Denies cough, dyspnea or wheezes Cardiovascular: Denies palpitation, chest discomfort or lower extremity swelling Gastrointestinal:  Denies nausea, heartburn or change in bowel habits Skin: Denies abnormal skin rashes Lymphatics: Denies new lymphadenopathy or easy bruising Neurological:Denies numbness, tingling or new weaknesses Behavioral/Psych: Mood is stable, no new changes  Breast:  denies any pain or lumps or nodules in either breasts All other systems were reviewed with the patient and are negative.  I have reviewed the past medical history, past surgical history, social history and family history with the patient and they are unchanged from previous note.  ALLERGIES:  is allergic to piroxicam; sulfa antibiotics; tetanus toxoids; gemfibrozil; and lisinopril.  MEDICATIONS:  Current Outpatient Prescriptions  Medication Sig Dispense Refill  . anastrozole (ARIMIDEX) 1 MG tablet Take 1 tablet (1 mg total) by mouth daily. 90 tablet 3  . aspirin 81 MG tablet Take 81 mg by mouth daily.    . B Complex-C (SUPER B COMPLEX PO) Take 1 capsule by mouth daily.    . carvedilol (COREG) 6.25 MG tablet TAKE ONE TABLET BY MOUTH TWICE DAILY WITH MEALS, MUST HAVE FOLLOW UP APPOINTMENT FOR FURTHER REFILLS 60 tablet 1  .  CINNAMON PO Take 1 tablet by mouth 2 (two) times daily.     . clopidogrel (PLAVIX) 75 MG tablet Take 75 mg by mouth daily.    . hydrALAZINE (APRESOLINE) 10 MG tablet Take 10 mg by mouth 3 (three) times daily.     Marland Kitchen HYDROcodone-acetaminophen (NORCO/VICODIN) 5-325 MG per tablet Take 1-2 tablets by mouth every 6 (six) hours as needed for moderate pain. 20 tablet 0  .  Magnesium Citrate 100 MG TABS Take 100 mg by mouth 2 (two) times daily.    . metFORMIN (GLUCOPHAGE-XR) 500 MG 24 hr tablet Take 500 mg by mouth 2 (two) times daily.    . Multiple Vitamins-Minerals (MULTIVITAMIN WITH MINERALS) tablet Take 1 tablet by mouth daily.    . nitroGLYCERIN (NITROSTAT) 0.4 MG SL tablet Place 0.4 mg under the tongue as needed.    . pravastatin (PRAVACHOL) 80 MG tablet Take 80 mg by mouth daily.    . Vitamins C E (CRANBERRY CONCENTRATE PO) Take 1 tablet by mouth 2 (two) times daily.     . traMADol (ULTRAM) 50 MG tablet Take 1 tablet (50 mg total) by mouth every 6 (six) hours as needed. 60 tablet 1   No current facility-administered medications for this visit.    PHYSICAL EXAMINATION: ECOG PERFORMANCE STATUS: 0 - Asymptomatic  Filed Vitals:   02/10/14 1158  BP: 154/74  Pulse: 71  Temp: 98.1 F (36.7 C)  Resp: 18   Filed Weights   02/10/14 1158  Weight: 161 lb 1.6 oz (73.074 kg)    GENERAL:alert, no distress and comfortable SKIN: skin color, texture, turgor are normal, no rashes or significant lesions EYES: normal, Conjunctiva are pink and non-injected, sclera clear OROPHARYNX:no exudate, no erythema and lips, buccal mucosa, and tongue normal  NECK: supple, thyroid normal size, non-tender, without nodularity LYMPH:  no palpable lymphadenopathy in the cervical, axillary or inguinal LUNGS: clear to auscultation and percussion with normal breathing effort HEART: regular rate & rhythm and no murmurs and no lower extremity edema ABDOMEN:abdomen soft, non-tender and normal bowel sounds Musculoskeletal:no cyanosis of digits and no clubbing  NEURO: alert & oriented x 3 with fluent speech, no focal motor/sensory deficits BREAST: No palpable masses or nodules in either right or left breasts. No palpable axillary supraclavicular or infraclavicular adenopathy no breast tenderness or nipple discharge.   LABORATORY DATA:  I have reviewed the data as listed   Chemistry       Component Value Date/Time   NA 139 02/10/2014 1047   NA 138 01/31/2012 1030   K 4.1 02/10/2014 1047   K 4.9 01/31/2012 1030   CL 102 09/07/2012 1137   CL 98 01/31/2012 1030   CO2 31* 02/10/2014 1047   CO2 28 01/31/2012 1030   BUN 15.4 02/10/2014 1047   BUN 18 01/31/2012 1030   CREATININE 0.8 02/10/2014 1047   CREATININE 0.78 01/31/2012 1030      Component Value Date/Time   CALCIUM 9.8 02/10/2014 1047   CALCIUM 9.8 01/31/2012 1030   ALKPHOS 82 02/10/2014 1047   ALKPHOS 75 01/31/2012 1030   AST 17 02/10/2014 1047   AST 32 01/31/2012 1030   ALT 17 02/10/2014 1047   ALT 23 01/31/2012 1030   BILITOT 0.39 02/10/2014 1047   BILITOT 0.4 01/31/2012 1030       Lab Results  Component Value Date   WBC 6.1 02/10/2014   HGB 12.8 02/10/2014   HCT 38.0 02/10/2014   MCV 90.9 02/10/2014   PLT 278 02/10/2014  NEUTROABS 4.3 02/10/2014     RADIOGRAPHIC STUDIES: Mammograms done in June 2015 were reviewed  ASSESSMENT & PLAN:  Breast cancer, right breast Right breast: stage II A. (T1 C. N1 a cM0) invasive ductal carcinoma Measuring 1.8 cm ER positive PR positive HER-2/neu positive. One sentinel node was positive for metastatic disease.status post 6 cycles of TCH followed by Herceptin maintenance. Received adjuvant radiation therapy and is currently on Arimidex that started July 2014.  Aromatase inhibitor counseling: Patient is tolerating Arimidex extremely well without any major problems or concerns. I discussed with her that she will need a bone density test every other year.  Surveillance: Mammograms done in June 2015 revealed a nodule in the left breast axilla. This was felt to be benign lymph node. She will be scheduling herself for another mammogram and ultrasound in 03/11/2014. I will discuss the results with her on the phone. If it is negative, then she will be seen by a 66 months. If it is suspicious for any malignancy, she will need to undergo biopsy and followup as  needed.  Patient's son accompanied her and discuss with him he extensively whether she was truly HER-2 positive. I discussed with him that I not aware of the significance of Hermark testing. But I reassured him that there is not ongoing discussion whether even though HER-2 positive patients may benefit from anti-HER-2 therapy. I also reassured him that the majority of adjuvant therapy benefit is provided by the adjuvant antiestrogen therapy.   No orders of the defined types were placed in this encounter.   The patient has a good understanding of the overall plan. she agrees with it. She will call with any problems that may develop before her next visit here.  I spent 25 minutes counseling the patient face to face. The total time spent in the appointment was 30 minutes and more than 50% was on counseling and review of test results    Rulon Eisenmenger, MD 02/10/2014 1:13 PM

## 2014-02-12 ENCOUNTER — Encounter: Payer: Self-pay | Admitting: Hematology and Oncology

## 2014-02-13 ENCOUNTER — Other Ambulatory Visit: Payer: Self-pay | Admitting: Hematology and Oncology

## 2014-02-13 DIAGNOSIS — C50911 Malignant neoplasm of unspecified site of right female breast: Secondary | ICD-10-CM

## 2014-02-17 ENCOUNTER — Encounter: Payer: Self-pay | Admitting: *Deleted

## 2014-02-24 ENCOUNTER — Other Ambulatory Visit: Payer: Self-pay

## 2014-02-24 DIAGNOSIS — C50911 Malignant neoplasm of unspecified site of right female breast: Secondary | ICD-10-CM

## 2014-02-24 NOTE — Progress Notes (Signed)
Entered order for ultrasound.  Faxed ultrasound and mammogram order to [facility] [phone].  Sent to scan.

## 2014-02-25 ENCOUNTER — Other Ambulatory Visit: Payer: Self-pay

## 2014-02-25 DIAGNOSIS — C50911 Malignant neoplasm of unspecified site of right female breast: Secondary | ICD-10-CM

## 2014-03-14 ENCOUNTER — Telehealth: Payer: Self-pay

## 2014-03-14 NOTE — Telephone Encounter (Signed)
Mammogram results rcvd from [facility] dtd 03/11/14.  Copy to Dr Lindi Adie.  Original to scan.

## 2014-03-14 NOTE — Telephone Encounter (Signed)
Same mammo results rcvd from [facility]

## 2014-04-23 ENCOUNTER — Encounter: Payer: Self-pay | Admitting: Nurse Practitioner

## 2014-04-29 ENCOUNTER — Other Ambulatory Visit: Payer: Self-pay

## 2014-04-29 DIAGNOSIS — C50911 Malignant neoplasm of unspecified site of right female breast: Secondary | ICD-10-CM

## 2014-05-05 ENCOUNTER — Ambulatory Visit (INDEPENDENT_AMBULATORY_CARE_PROVIDER_SITE_OTHER): Payer: Self-pay | Admitting: Surgery

## 2014-05-05 ENCOUNTER — Other Ambulatory Visit: Payer: Self-pay | Admitting: Nurse Practitioner

## 2014-05-19 ENCOUNTER — Ambulatory Visit (INDEPENDENT_AMBULATORY_CARE_PROVIDER_SITE_OTHER): Payer: Self-pay | Admitting: Surgery

## 2014-05-19 NOTE — H&P (Signed)
Heather Vega. Heather Vega 05/19/2014 2:14 PM Location: [facility] Patient #: [id number] DOB: October 15, 1935 Separated / Language: Heather Vega / Race: White Female History of Present Illness Marcello Moores A. Shamyra Farias MD; 05/19/2014 2:39 PM) Patient words: port removal?  pt here to set up port removal. Mammograms UTD and she is doing well.     Heather Vega is a 79 y.o.Marland Kitchenfemale who presents for routine followup of her Stage II IDC, receptor +, Her 2 + diagnosed in 12/2011 and treated with lumpectomy, sln, chemo done but herceptincontinues starting radiation soon. She has no problems or concerns on either side. Had MI in January but is recovering. Now on plavix for stents placed.  [name]   Other Problems Mammie Lorenzo, LPN; 4/33/2951 8:84 PM) Breast Cancer  Past Surgical History Mammie Lorenzo, LPN; 1/66/0630 1:60 PM) Breast Mass; Local Excision Right.  Diagnostic Studies History Mammie Lorenzo, LPN; 04/05/3233 5:73 PM) Colonoscopy 1-5 years ago Mammogram within last year  Allergies Mammie Lorenzo, LPN; 05/17/2540 7:06 PM) Piroxicam *ANALGESICS - ANTI-INFLAMMATORY* Sulfa Antibiotics Tetanus Toxoid Adsorbed *TOXOIDS* Gemfibrozil *ANTIHYPERLIPIDEMICS* Lisinopril *ANTIHYPERTENSIVES*  Medication History Mammie Lorenzo, LPN; 2/37/6283 1:51 PM) Anastrozole (1MG  Tablet, Oral) Active. Aspirin Childrens (81MG  Tablet Chewable, Oral) Active. B Complex (Oral) Active. Coreg (6.25MG  Tablet, Oral) Active. Cinnamon Bark Active. HydrALAZINE HCl (10MG  Tablet, Oral) Active. Norco (5-325MG  Tablet, Oral) Active. Magnesium Citrate (100MG  Tablet, Oral) Active. MetFORMIN HCl ER (500MG  Tablet ER 24HR, Oral) Active. Nitrostat (0.4MG  Tab Sublingual, Sublingual) Active. Pravachol (80MG  Tablet, Oral) Active. Cranberry Concentrate (140-100-3MG -MG-UNIT Capsule, Oral) Active. Plavix (75MG  Tablet, Oral) Active. (pt not certain on correct dose) Medications  Reconciled  Social History Mammie Lorenzo, LPN; 7/61/6073 7:10 PM) Caffeine use Coffee. No alcohol use  Family History Mammie Lorenzo, LPN; 09/21/9483 4:62 PM) Colon Cancer Father.  Pregnancy / Birth History Mammie Lorenzo, LPN; 09/28/5007 3:81 PM) Durenda Age 4 Maternal age 69-25 Para 4     Review of Systems Mammie Lorenzo LPN; 11/23/9369 6:96 PM) General Not Present- Appetite Loss, Chills, Fatigue, Fever, Night Sweats, Weight Gain and Weight Loss. Skin Not Present- Change in Wart/Mole, Dryness, Hives, Jaundice, New Lesions, Non-Healing Wounds, Rash and Ulcer. HEENT Not Present- Earache, Hearing Loss, Hoarseness, Nose Bleed, Oral Ulcers, Ringing in the Ears, Seasonal Allergies, Sinus Pain, Sore Throat, Visual Disturbances, Wears glasses/contact lenses and Yellow Eyes. Respiratory Not Present- Bloody sputum, Chronic Cough, Difficulty Breathing, Snoring and Wheezing. Cardiovascular Not Present- Chest Pain, Difficulty Breathing Lying Down, Leg Cramps, Palpitations, Rapid Heart Rate, Shortness of Breath and Swelling of Extremities. Gastrointestinal Not Present- Abdominal Pain, Bloating, Bloody Stool, Change in Bowel Habits, Chronic diarrhea, Constipation, Difficulty Swallowing, Excessive gas, Gets full quickly at meals, Hemorrhoids, Indigestion, Nausea, Rectal Pain and Vomiting. Female Genitourinary Not Present- Frequency, Nocturia, Painful Urination, Pelvic Pain and Urgency. Musculoskeletal Not Present- Back Pain, Joint Pain, Joint Stiffness, Muscle Pain, Muscle Weakness and Swelling of Extremities. Endocrine Not Present- Cold Intolerance, Excessive Hunger, Hair Changes, Heat Intolerance, Hot flashes and New Diabetes.  Vitals Claiborne Billings Dockery LPN; 7/89/3810 1:75 PM) 05/19/2014 2:15 PM Weight: 180.6 lb Height: 65in Body Surface Area: 1.94 m Body Mass Index: 30.05 kg/m Temp.: 98.28F  Pulse: 79 (Regular)  Resp.: 16 (Unlabored)  BP: 132/80 (Sitting, Left Arm,  Standard)     Physical Exam (Jessice Madill A. Jesly Hartmann MD; 05/19/2014 2:39 PM)  General Mental Status-Alert. General Appearance-Consistent with stated age. Hydration-Well hydrated. Voice-Normal.  Head and Neck Head-normocephalic, atraumatic with no lesions or palpable masses.  Chest and Lung Exam Note: port left subclavian  Neurologic Neurologic evaluation reveals -alert and oriented x 3 with no impairment of recent or remote memory. Mental Status-Normal.  Musculoskeletal Normal Exam - Left-Upper Extremity Strength Normal and Lower Extremity Strength Normal. Normal Exam - Right-Upper Extremity Strength Normal, Lower Extremity Weakness.    Assessment & Plan (Demetrick Eichenberger A. Lawton Dollinger MD; 05/19/2014 2:40 PM)  HISTORY OF BREAST CANCER (V10.3  Z85.3) Impression: port removal. risk of bleeding infection embolization. Off plavix 5 days preop and restart next day.

## 2014-08-27 ENCOUNTER — Other Ambulatory Visit: Payer: Self-pay | Admitting: *Deleted

## 2014-08-27 ENCOUNTER — Telehealth: Payer: Self-pay | Admitting: *Deleted

## 2014-08-27 DIAGNOSIS — C50911 Malignant neoplasm of unspecified site of right female breast: Secondary | ICD-10-CM

## 2014-08-27 NOTE — Telephone Encounter (Signed)
fax'x orders for mammo/u/s to [facility].

## 2014-09-24 ENCOUNTER — Telehealth: Payer: Self-pay

## 2014-09-24 NOTE — Telephone Encounter (Signed)
Mammogram results dtd 09/23/14 rcvd from [facility].  Reviewed by Dr. Lindi Adie.  Sent to scan.

## 2014-09-25 ENCOUNTER — Ambulatory Visit (HOSPITAL_BASED_OUTPATIENT_CLINIC_OR_DEPARTMENT_OTHER): Payer: Medicare Other | Admitting: Hematology and Oncology

## 2014-09-25 ENCOUNTER — Encounter: Payer: Self-pay | Admitting: Hematology and Oncology

## 2014-09-25 VITALS — BP 174/78 | HR 78 | Temp 97.9°F | Resp 18 | Ht 65.5 in | Wt 178.2 lb

## 2014-09-25 DIAGNOSIS — Z79811 Long term (current) use of aromatase inhibitors: Secondary | ICD-10-CM

## 2014-09-25 DIAGNOSIS — C50911 Malignant neoplasm of unspecified site of right female breast: Secondary | ICD-10-CM

## 2014-09-25 DIAGNOSIS — Z170 Estrogen receptor positive status [ER+]: Secondary | ICD-10-CM | POA: Diagnosis not present

## 2014-09-25 NOTE — Assessment & Plan Note (Addendum)
Right breast: stage II A. (T1 C. N1 a cM0) invasive ductal carcinoma Measuring 1.8 cm ER positive PR positive HER-2/neu positive. One sentinel node was positive for metastatic disease.status post 6 cycles of TCH followed by Herceptin maintenance. Received adjuvant radiation therapy and is currently on Arimidex that started July 2014.  Arimidex toxicities:  Breast Cancer Surveillance: 1. Breast exam  09/25/2014: Normal 2. Mammogram [facility] December 2015 left breast 6 mm mass evaluated by ultrasound found to be stable compared to prior.  Breast Density Category C. I recommended that she get 3-D mammograms for surveillance. Discussed the differences between different breast density categories.  Return to clinic in 1 year for follow-up

## 2014-09-25 NOTE — Progress Notes (Signed)
Patient Care Team: Heather Donna, MD as PCP - General (Family Medicine)  DIAGNOSIS: Breast cancer, right breast   Staging form: Breast, AJCC 7th Edition     Clinical: No stage assigned - Unsigned       Prognostic indicators: ER= 100PR=100%  Ki67 = 11%  Her2 = 1.74, No amplification      Pathologic: Stage IIA (T1c, N1a, cM0) - Signed by Haywood Lasso, MD on 02/09/2012       Prognostic indicators: ER= 100PR=100%  Ki67 = 11%  Her2 = 1.74, No amplification    SUMMARY OF ONCOLOGIC HISTORY: Oncology History       Breast cancer, right breast   02/07/2012 Surgery Right breast lumpectomy: Invasive ductal carcinoma grade 2, 1.8 cm with low-grade DCIS, 1 SLN positive, ER 100%, PR at present, HER-2 negative ratio 1.74 Ki-67 11% T1 C. N1 M0 stage IIA, HER-2 by her mark positive   03/30/2012 - 04/09/2013 Chemotherapy Taxotere, carboplatin, Herceptin x6 cycles followed by Herceptin maintenance that completed 04/26/2013   08/23/2012 - 10/02/2012 Radiation Therapy Adjuvant radiation therapy   10/10/2012 -  Anti-estrogen oral therapy Initially letrozole which could not be tolerated, now tolerating Arimidex since 11/2012    CHIEF COMPLIANT: follow up on Arimidex  INTERVAL HISTORY: Heather Vega is a 79 year old with above mentioned H/O Right breast cancer who is currently on adjuvant antiestrogen therapy with Arimidex. She is tolerating it extremely well with occasional aches and pains but otherwise doing well. She has recently noticed some aches on her right side of the neck. Initially she thought she was having a stroke that it appears that this is more musculoskeletal in nature. Denies any lumps or nodules in the breasts.  REVIEW OF SYSTEMS:   Constitutional: Denies fevers, chills or abnormal weight loss Eyes: Denies blurriness of vision Ears, nose, mouth, throat, and face: Denies mucositis or sore throat Respiratory: Denies cough, dyspnea or wheezes Cardiovascular: Denies palpitation, chest  discomfort or lower extremity swelling Gastrointestinal:  Denies nausea, heartburn or change in bowel habits Skin: Denies abnormal skin rashes Lymphatics: Denies new lymphadenopathy or easy bruising Neurological:Denies numbness, tingling or new weaknesses Behavioral/Psych: Mood is stable, no new changes  Breast:  denies any pain or lumps or nodules in either breasts All other systems were reviewed with the patient and are negative.  I have reviewed the past medical history, past surgical history, social history and family history with the patient and they are unchanged from previous note.  ALLERGIES:  is allergic to piroxicam; sulfa antibiotics; tetanus toxoids; gemfibrozil; and lisinopril.  MEDICATIONS:  Current Outpatient Prescriptions  Medication Sig Dispense Refill  . anastrozole (ARIMIDEX) 1 MG tablet Take 1 tablet (1 mg total) by mouth daily. 90 tablet 3  . aspirin 81 MG tablet Take 81 mg by mouth daily.    . B Complex-C (SUPER B COMPLEX PO) Take 1 capsule by mouth daily.    . carvedilol (COREG) 6.25 MG tablet TAKE ONE TABLET BY MOUTH TWICE DAILY WITH MEALS, MUST HAVE FOLLOW UP APPOINTMENT FOR FURTHER REFILLS 60 tablet 1  . CINNAMON PO Take 1 tablet by mouth 2 (two) times daily.     Marland Kitchen HYDROcodone-acetaminophen (NORCO/VICODIN) 5-325 MG per tablet Take 1-2 tablets by mouth every 6 (six) hours as needed for moderate pain. 20 tablet 0  . Magnesium Citrate 100 MG TABS Take 100 mg by mouth 2 (two) times daily.    . metFORMIN (GLUCOPHAGE-XR) 500 MG 24 hr tablet Take 500 mg by  mouth 2 (two) times daily.    . Multiple Vitamins-Minerals (MULTIVITAMIN WITH MINERALS) tablet Take 1 tablet by mouth daily.    . pravastatin (PRAVACHOL) 80 MG tablet Take 80 mg by mouth daily.    . traMADol (ULTRAM) 50 MG tablet Take 1 tablet (50 mg total) by mouth every 6 (six) hours as needed. 60 tablet 1  . Vitamins C E (CRANBERRY CONCENTRATE PO) Take 1 tablet by mouth 2 (two) times daily.     . hydrALAZINE  (APRESOLINE) 10 MG tablet Take 10 mg by mouth 3 (three) times daily.     . nitroGLYCERIN (NITROSTAT) 0.4 MG SL tablet Place 0.4 mg under the tongue as needed.     No current facility-administered medications for this visit.    PHYSICAL EXAMINATION: ECOG PERFORMANCE STATUS: 1 - Symptomatic but completely ambulatory  Filed Vitals:   09/25/14 1439  BP: 174/78  Pulse: 78  Temp: 97.9 F (36.6 C)  Resp: 18   Filed Weights   09/25/14 1439  Weight: 178 lb 3.2 oz (80.831 kg)    GENERAL:alert, no distress and comfortable SKIN: skin color, texture, turgor are normal, no rashes or significant lesions EYES: normal, Conjunctiva are pink and non-injected, sclera clear OROPHARYNX:no exudate, no erythema and lips, buccal mucosa, and tongue normal  NECK: supple, thyroid normal size, non-tender, without nodularity LYMPH:  no palpable lymphadenopathy in the cervical, axillary or inguinal LUNGS: clear to auscultation and percussion with normal breathing effort HEART: regular rate & rhythm and no murmurs and no lower extremity edema ABDOMEN:abdomen soft, non-tender and normal bowel sounds Musculoskeletal:no cyanosis of digits and no clubbing  NEURO: alert & oriented x 3 with fluent speech, no focal motor/sensory deficits BREAST: No palpable masses or nodules in either right or left breasts.  scar tissue from previous surgery is found in the right breast and axilla. No palpable axillary supraclavicular or infraclavicular adenopathy no breast tenderness or nipple discharge. (exam performed in the presence of a chaperone)  LABORATORY DATA:  I have reviewed the data as listed   Chemistry      Component Value Date/Time   NA 139 02/10/2014 1047   NA 138 01/31/2012 1030   K 4.1 02/10/2014 1047   K 4.9 01/31/2012 1030   CL 102 09/07/2012 1137   CL 98 01/31/2012 1030   CO2 31* 02/10/2014 1047   CO2 28 01/31/2012 1030   BUN 15.4 02/10/2014 1047   BUN 18 01/31/2012 1030   CREATININE 0.8 02/10/2014  1047   CREATININE 0.78 01/31/2012 1030      Component Value Date/Time   CALCIUM 9.8 02/10/2014 1047   CALCIUM 9.8 01/31/2012 1030   ALKPHOS 82 02/10/2014 1047   ALKPHOS 75 01/31/2012 1030   AST 17 02/10/2014 1047   AST 32 01/31/2012 1030   ALT 17 02/10/2014 1047   ALT 23 01/31/2012 1030   BILITOT 0.39 02/10/2014 1047   BILITOT 0.4 01/31/2012 1030       Lab Results  Component Value Date   WBC 6.1 02/10/2014   HGB 12.8 02/10/2014   HCT 38.0 02/10/2014   MCV 90.9 02/10/2014   PLT 278 02/10/2014   NEUTROABS 4.3 02/10/2014     RADIOGRAPHIC STUDIES: I have personally reviewed the radiology reports and agreed with their findings. Mammogram done at [facility] in December 2015 showed improvement in the abnormality in the left breast. This was further evaluated with another mammogram and ultrasound last month and apparently the results also show continued improvement. I do not  have the most recent copy of that [facility] report.   ASSESSMENT & PLAN:  Breast cancer, right breast Right breast: stage II A. (T1 C. N1 a cM0) invasive ductal carcinoma Measuring 1.8 cm ER positive PR positive HER-2/neu positive. One sentinel node was positive for metastatic disease.status post 6 cycles of TCH followed by Herceptin maintenance. Received adjuvant radiation therapy and is currently on Arimidex that started July 2014.  Arimidex toxicities: Occasional muscle aches and pains   Breast Cancer Surveillance: 1. Breast exam  09/25/2014: Normal 2. Mammogram [facility] December 2015 left breast 6 mm mass evaluated by ultrasound found to be stable compared to prior.  Breast Density Category C. I recommended that she get 3-D mammograms for surveillance. Discussed the differences between different breast density categories.  Patient is moving to [place] next month and will plan to follow-up with a local oncologist. We will send our records to her physicians when she gets established there.   No orders of the defined types  were placed in this encounter.   The patient has a good understanding of the overall plan. she agrees with it. she will call with any problems that may develop before the next visit here.   Rulon Eisenmenger, MD

## 2014-11-17 ENCOUNTER — Telehealth: Payer: Self-pay | Admitting: Hematology and Oncology

## 2014-11-17 ENCOUNTER — Telehealth: Payer: Self-pay

## 2014-11-17 NOTE — Telephone Encounter (Signed)
Ultrasound results dtd 09/23/14 rcvd from [facility].  Reviewed by Dr. Lindi Adie.  Sent to scan.  Pt does not have follow up appt.  pof sent

## 2014-11-17 NOTE — Telephone Encounter (Signed)
08/2015 calendar mailed to patient per pof  anne

## 2014-12-30 ENCOUNTER — Telehealth: Payer: Self-pay | Admitting: Hematology and Oncology

## 2014-12-30 NOTE — Telephone Encounter (Signed)
Faxed pt last office note to [facility] [phone]

## 2015-09-15 ENCOUNTER — Other Ambulatory Visit: Payer: Self-pay

## 2015-09-15 DIAGNOSIS — C50411 Malignant neoplasm of upper-outer quadrant of right female breast: Secondary | ICD-10-CM

## 2015-09-17 ENCOUNTER — Other Ambulatory Visit: Payer: Medicare Other

## 2015-09-17 ENCOUNTER — Ambulatory Visit: Payer: Medicare Other | Admitting: Hematology and Oncology

## 2015-09-17 NOTE — Assessment & Plan Note (Signed)
Right breast: stage II A. (T1 C. N1 a cM0) invasive ductal carcinoma Measuring 1.8 cm ER positive PR positive HER-2/neu positive. One sentinel node was positive for metastatic disease.status post 6 cycles of TCH followed by Herceptin maintenance. Received adjuvant radiation therapy and is currently on Arimidex that started July 2014.  Arimidex toxicities: Occasional muscle aches and pains   Breast Cancer Surveillance: 1. Breast exam06/20/2017: Normal 2. Mammogram [facility] 09/23/2014 left breast 6 mm mass evaluated by ultrasound found to be stable compared to prior. The recommended a six-month follow-up evaluation but she did not undergo that. Breast Density Category C. I recommended that she get 3-D mammograms for surveillance. Discussed the differences between different breast density categories.  Patient is moving to [place] next month and will plan to follow-up with a local oncologist. We will send our records to her physicians when she gets established there.
